# Patient Record
Sex: Female | Born: 1975 | Race: White | Hispanic: No | Marital: Married | Smoking: Former smoker
Health system: Southern US, Community
[De-identification: ages and names within clinical notes are randomized; demographics above are authoritative.]

## PROBLEM LIST (undated history)

## (undated) DIAGNOSIS — G8929 Other chronic pain: Secondary | ICD-10-CM

## (undated) DIAGNOSIS — M810 Age-related osteoporosis without current pathological fracture: Secondary | ICD-10-CM

## (undated) DIAGNOSIS — Z720 Tobacco use: Secondary | ICD-10-CM

## (undated) DIAGNOSIS — D649 Anemia, unspecified: Secondary | ICD-10-CM

## (undated) DIAGNOSIS — K219 Gastro-esophageal reflux disease without esophagitis: Secondary | ICD-10-CM

## (undated) DIAGNOSIS — N809 Endometriosis, unspecified: Secondary | ICD-10-CM

## (undated) DIAGNOSIS — G4730 Sleep apnea, unspecified: Secondary | ICD-10-CM

## (undated) DIAGNOSIS — O24419 Gestational diabetes mellitus in pregnancy, unspecified control: Secondary | ICD-10-CM

## (undated) DIAGNOSIS — M47816 Spondylosis without myelopathy or radiculopathy, lumbar region: Secondary | ICD-10-CM

## (undated) DIAGNOSIS — R609 Edema, unspecified: Secondary | ICD-10-CM

## (undated) DIAGNOSIS — E785 Hyperlipidemia, unspecified: Secondary | ICD-10-CM

## (undated) DIAGNOSIS — I10 Essential (primary) hypertension: Secondary | ICD-10-CM

## (undated) DIAGNOSIS — Z8742 Personal history of other diseases of the female genital tract: Secondary | ICD-10-CM

## (undated) DIAGNOSIS — R232 Flushing: Secondary | ICD-10-CM

## (undated) DIAGNOSIS — R251 Tremor, unspecified: Secondary | ICD-10-CM

## (undated) DIAGNOSIS — F329 Major depressive disorder, single episode, unspecified: Secondary | ICD-10-CM

## (undated) DIAGNOSIS — F319 Bipolar disorder, unspecified: Secondary | ICD-10-CM

## (undated) DIAGNOSIS — J45909 Unspecified asthma, uncomplicated: Secondary | ICD-10-CM

## (undated) DIAGNOSIS — F410 Panic disorder [episodic paroxysmal anxiety] without agoraphobia: Secondary | ICD-10-CM

## (undated) DIAGNOSIS — F32A Depression, unspecified: Secondary | ICD-10-CM

## (undated) DIAGNOSIS — M1990 Unspecified osteoarthritis, unspecified site: Secondary | ICD-10-CM

## (undated) DIAGNOSIS — N83209 Unspecified ovarian cyst, unspecified side: Secondary | ICD-10-CM

## (undated) DIAGNOSIS — R011 Cardiac murmur, unspecified: Secondary | ICD-10-CM

## (undated) HISTORY — DX: Unspecified asthma, uncomplicated: J45.909

## (undated) HISTORY — DX: Other chronic pain: G89.29

## (undated) HISTORY — DX: Major depressive disorder, single episode, unspecified: F32.9

## (undated) HISTORY — DX: Bipolar disorder, unspecified: F31.9

## (undated) HISTORY — DX: Gestational diabetes mellitus in pregnancy, unspecified control: O24.419

## (undated) HISTORY — DX: Panic disorder (episodic paroxysmal anxiety): F41.0

## (undated) HISTORY — DX: Cardiac murmur, unspecified: R01.1

## (undated) HISTORY — PX: GASTRIC BYPASS: SHX52

## (undated) HISTORY — PX: CHOLECYSTECTOMY: SHX55

## (undated) HISTORY — DX: Endometriosis, unspecified: N80.9

## (undated) HISTORY — DX: Depression, unspecified: F32.A

## (undated) HISTORY — DX: Tremor, unspecified: R25.1

## (undated) HISTORY — PX: ABDOMINAL HYSTERECTOMY: SHX81

## (undated) HISTORY — DX: Edema, unspecified: R60.9

## (undated) HISTORY — DX: Flushing: R23.2

---

## 1999-02-24 ENCOUNTER — Other Ambulatory Visit: Admission: RE | Admit: 1999-02-24 | Discharge: 1999-02-24 | Payer: Self-pay | Admitting: Obstetrics and Gynecology

## 1999-12-21 ENCOUNTER — Encounter: Admission: RE | Admit: 1999-12-21 | Discharge: 1999-12-21 | Payer: Self-pay | Admitting: Obstetrics and Gynecology

## 1999-12-21 ENCOUNTER — Encounter: Payer: Self-pay | Admitting: Obstetrics and Gynecology

## 2000-05-15 ENCOUNTER — Inpatient Hospital Stay (HOSPITAL_COMMUNITY): Admission: AD | Admit: 2000-05-15 | Discharge: 2000-05-19 | Payer: Self-pay | Admitting: Obstetrics and Gynecology

## 2000-05-15 ENCOUNTER — Encounter (INDEPENDENT_AMBULATORY_CARE_PROVIDER_SITE_OTHER): Payer: Self-pay

## 2001-02-04 ENCOUNTER — Encounter: Admission: RE | Admit: 2001-02-04 | Discharge: 2001-02-04 | Payer: Self-pay | Admitting: Orthopedic Surgery

## 2001-02-04 ENCOUNTER — Encounter: Payer: Self-pay | Admitting: Orthopedic Surgery

## 2001-07-25 ENCOUNTER — Ambulatory Visit (HOSPITAL_BASED_OUTPATIENT_CLINIC_OR_DEPARTMENT_OTHER): Admission: RE | Admit: 2001-07-25 | Discharge: 2001-07-25 | Payer: Self-pay | Admitting: Emergency Medicine

## 2001-10-29 ENCOUNTER — Other Ambulatory Visit: Admission: RE | Admit: 2001-10-29 | Discharge: 2001-10-29 | Payer: Self-pay | Admitting: Obstetrics and Gynecology

## 2002-09-13 ENCOUNTER — Emergency Department (HOSPITAL_COMMUNITY): Admission: EM | Admit: 2002-09-13 | Discharge: 2002-09-13 | Payer: Self-pay | Admitting: *Deleted

## 2002-11-24 ENCOUNTER — Other Ambulatory Visit: Admission: RE | Admit: 2002-11-24 | Discharge: 2002-11-24 | Payer: Self-pay | Admitting: Obstetrics and Gynecology

## 2003-04-13 ENCOUNTER — Ambulatory Visit (HOSPITAL_COMMUNITY): Admission: RE | Admit: 2003-04-13 | Discharge: 2003-04-13 | Payer: Self-pay | Admitting: Gastroenterology

## 2003-04-13 ENCOUNTER — Encounter: Payer: Self-pay | Admitting: Gastroenterology

## 2003-04-20 ENCOUNTER — Ambulatory Visit (HOSPITAL_COMMUNITY): Admission: RE | Admit: 2003-04-20 | Discharge: 2003-04-21 | Payer: Self-pay | Admitting: General Surgery

## 2003-04-20 ENCOUNTER — Encounter (INDEPENDENT_AMBULATORY_CARE_PROVIDER_SITE_OTHER): Payer: Self-pay | Admitting: *Deleted

## 2003-04-20 ENCOUNTER — Encounter: Payer: Self-pay | Admitting: General Surgery

## 2003-07-09 ENCOUNTER — Ambulatory Visit (HOSPITAL_COMMUNITY): Admission: RE | Admit: 2003-07-09 | Discharge: 2003-07-09 | Payer: Self-pay | Admitting: Gastroenterology

## 2003-07-23 ENCOUNTER — Ambulatory Visit (HOSPITAL_COMMUNITY): Admission: RE | Admit: 2003-07-23 | Discharge: 2003-07-23 | Payer: Self-pay | Admitting: Gastroenterology

## 2003-07-23 ENCOUNTER — Encounter: Payer: Self-pay | Admitting: Gastroenterology

## 2004-01-17 ENCOUNTER — Other Ambulatory Visit: Admission: RE | Admit: 2004-01-17 | Discharge: 2004-01-17 | Payer: Self-pay | Admitting: Obstetrics and Gynecology

## 2004-03-23 ENCOUNTER — Ambulatory Visit (HOSPITAL_COMMUNITY): Admission: RE | Admit: 2004-03-23 | Discharge: 2004-03-23 | Payer: Self-pay | Admitting: Obstetrics and Gynecology

## 2004-03-23 ENCOUNTER — Encounter (INDEPENDENT_AMBULATORY_CARE_PROVIDER_SITE_OTHER): Payer: Self-pay | Admitting: Specialist

## 2005-03-13 ENCOUNTER — Other Ambulatory Visit: Admission: RE | Admit: 2005-03-13 | Discharge: 2005-03-13 | Payer: Self-pay | Admitting: Obstetrics and Gynecology

## 2005-05-08 ENCOUNTER — Encounter: Admission: RE | Admit: 2005-05-08 | Discharge: 2005-08-06 | Payer: Self-pay | Admitting: Obstetrics and Gynecology

## 2005-05-18 ENCOUNTER — Inpatient Hospital Stay (HOSPITAL_COMMUNITY): Admission: AD | Admit: 2005-05-18 | Discharge: 2005-05-18 | Payer: Self-pay | Admitting: Obstetrics & Gynecology

## 2005-06-24 ENCOUNTER — Inpatient Hospital Stay (HOSPITAL_COMMUNITY): Admission: AD | Admit: 2005-06-24 | Discharge: 2005-06-24 | Payer: Self-pay | Admitting: Obstetrics and Gynecology

## 2005-08-08 ENCOUNTER — Inpatient Hospital Stay (HOSPITAL_COMMUNITY): Admission: AD | Admit: 2005-08-08 | Discharge: 2005-08-08 | Payer: Self-pay

## 2005-08-17 ENCOUNTER — Ambulatory Visit (HOSPITAL_BASED_OUTPATIENT_CLINIC_OR_DEPARTMENT_OTHER): Admission: RE | Admit: 2005-08-17 | Discharge: 2005-08-17 | Payer: Self-pay | Admitting: Pulmonary Disease

## 2005-08-18 ENCOUNTER — Ambulatory Visit: Payer: Self-pay | Admitting: Pulmonary Disease

## 2005-08-22 ENCOUNTER — Ambulatory Visit: Payer: Self-pay | Admitting: Pulmonary Disease

## 2005-08-31 ENCOUNTER — Inpatient Hospital Stay (HOSPITAL_COMMUNITY): Admission: AD | Admit: 2005-08-31 | Discharge: 2005-08-31 | Payer: Self-pay | Admitting: Obstetrics and Gynecology

## 2005-09-05 ENCOUNTER — Inpatient Hospital Stay (HOSPITAL_COMMUNITY): Admission: AD | Admit: 2005-09-05 | Discharge: 2005-09-05 | Payer: Self-pay | Admitting: Obstetrics and Gynecology

## 2005-09-08 ENCOUNTER — Inpatient Hospital Stay (HOSPITAL_COMMUNITY): Admission: AD | Admit: 2005-09-08 | Discharge: 2005-09-08 | Payer: Self-pay | Admitting: Obstetrics and Gynecology

## 2005-09-12 ENCOUNTER — Encounter (INDEPENDENT_AMBULATORY_CARE_PROVIDER_SITE_OTHER): Payer: Self-pay | Admitting: Specialist

## 2005-09-12 ENCOUNTER — Inpatient Hospital Stay (HOSPITAL_COMMUNITY): Admission: AD | Admit: 2005-09-12 | Discharge: 2005-09-16 | Payer: Self-pay | Admitting: Obstetrics and Gynecology

## 2005-09-26 ENCOUNTER — Inpatient Hospital Stay (HOSPITAL_COMMUNITY): Admission: AD | Admit: 2005-09-26 | Discharge: 2005-09-26 | Payer: Self-pay | Admitting: Obstetrics and Gynecology

## 2005-11-01 ENCOUNTER — Other Ambulatory Visit: Admission: RE | Admit: 2005-11-01 | Discharge: 2005-11-01 | Payer: Self-pay | Admitting: Obstetrics and Gynecology

## 2008-08-13 ENCOUNTER — Emergency Department (HOSPITAL_COMMUNITY): Admission: EM | Admit: 2008-08-13 | Discharge: 2008-08-13 | Payer: Self-pay | Admitting: Emergency Medicine

## 2008-08-24 ENCOUNTER — Encounter: Payer: Self-pay | Admitting: Gastroenterology

## 2008-09-02 ENCOUNTER — Ambulatory Visit: Payer: Self-pay | Admitting: Gastroenterology

## 2008-09-02 DIAGNOSIS — R1013 Epigastric pain: Secondary | ICD-10-CM

## 2008-09-02 DIAGNOSIS — K219 Gastro-esophageal reflux disease without esophagitis: Secondary | ICD-10-CM

## 2008-09-22 ENCOUNTER — Telehealth: Payer: Self-pay | Admitting: Gastroenterology

## 2008-12-01 ENCOUNTER — Encounter (INDEPENDENT_AMBULATORY_CARE_PROVIDER_SITE_OTHER): Payer: Self-pay | Admitting: Obstetrics and Gynecology

## 2008-12-01 ENCOUNTER — Ambulatory Visit (HOSPITAL_COMMUNITY): Admission: RE | Admit: 2008-12-01 | Discharge: 2008-12-02 | Payer: Self-pay | Admitting: Obstetrics and Gynecology

## 2008-12-05 ENCOUNTER — Inpatient Hospital Stay (HOSPITAL_COMMUNITY): Admission: AD | Admit: 2008-12-05 | Discharge: 2008-12-07 | Payer: Self-pay | Admitting: Obstetrics and Gynecology

## 2009-11-18 ENCOUNTER — Inpatient Hospital Stay: Payer: Self-pay | Admitting: Internal Medicine

## 2009-12-21 IMAGING — CR DG ABDOMEN ACUTE W/ 1V CHEST
4 series · 4 of 4 positions shown · non-contrast
Comparison: None.

CLINICAL DATA: 32-year-old female shortness of breath, mid
abdominal pain, nausea

ACUTE ABDOMEN SERIES (ABDOMEN 2 VIEW & CHEST 1 VIEW)

[w chest pa]
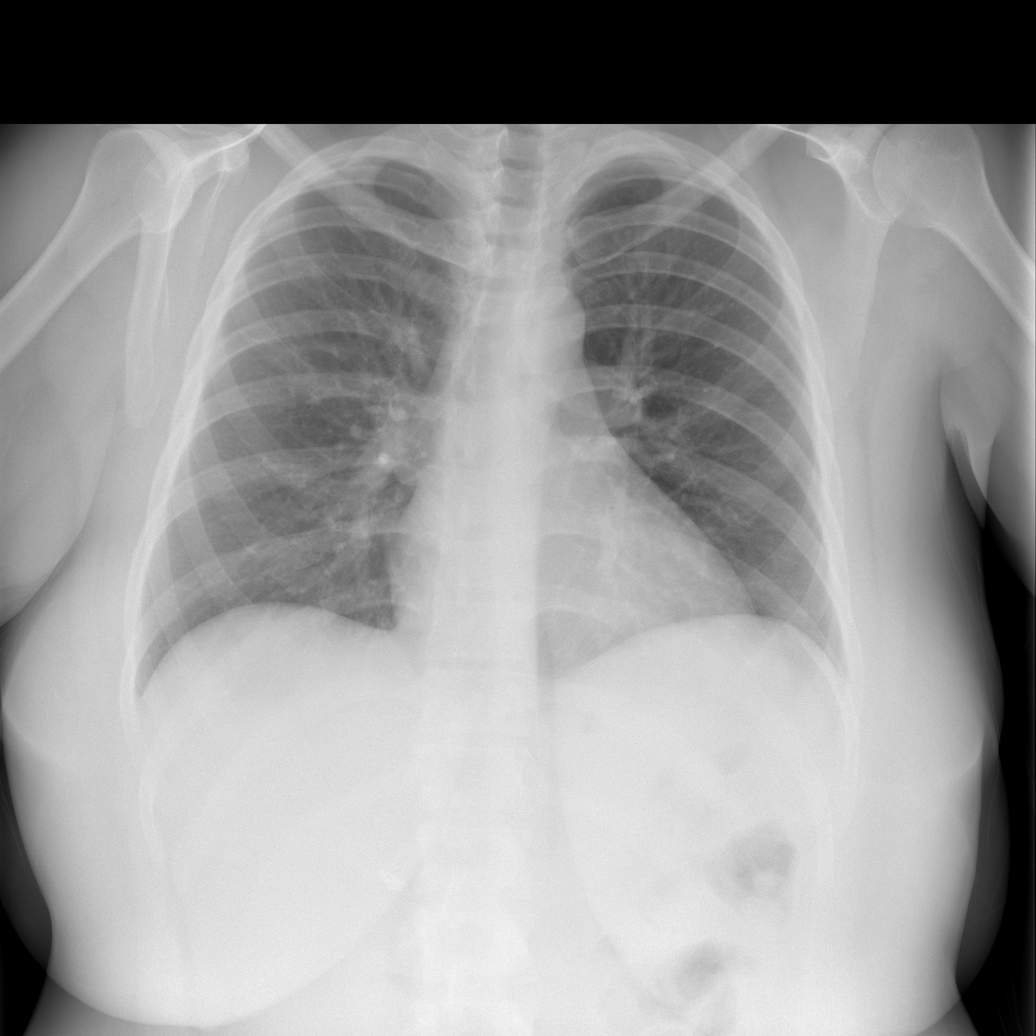

[w abdomen upright *]
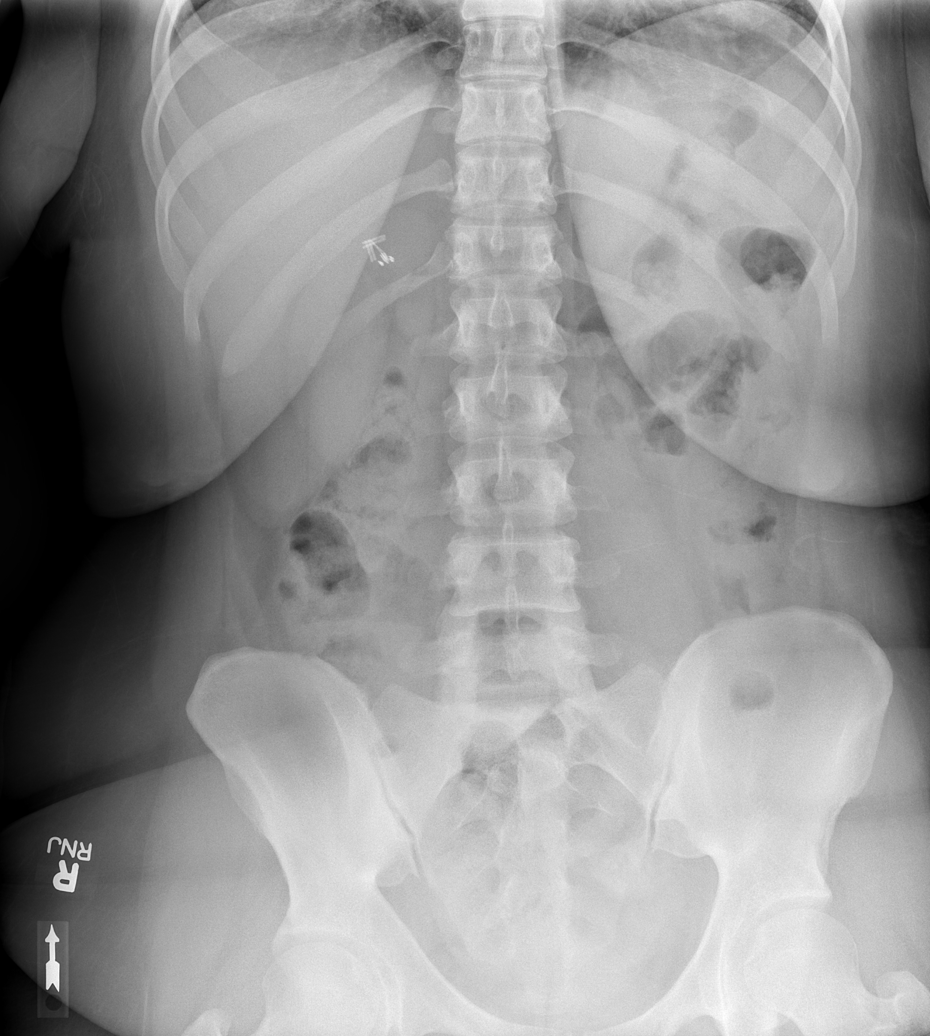

[t abdomen supine (1 of 2)]
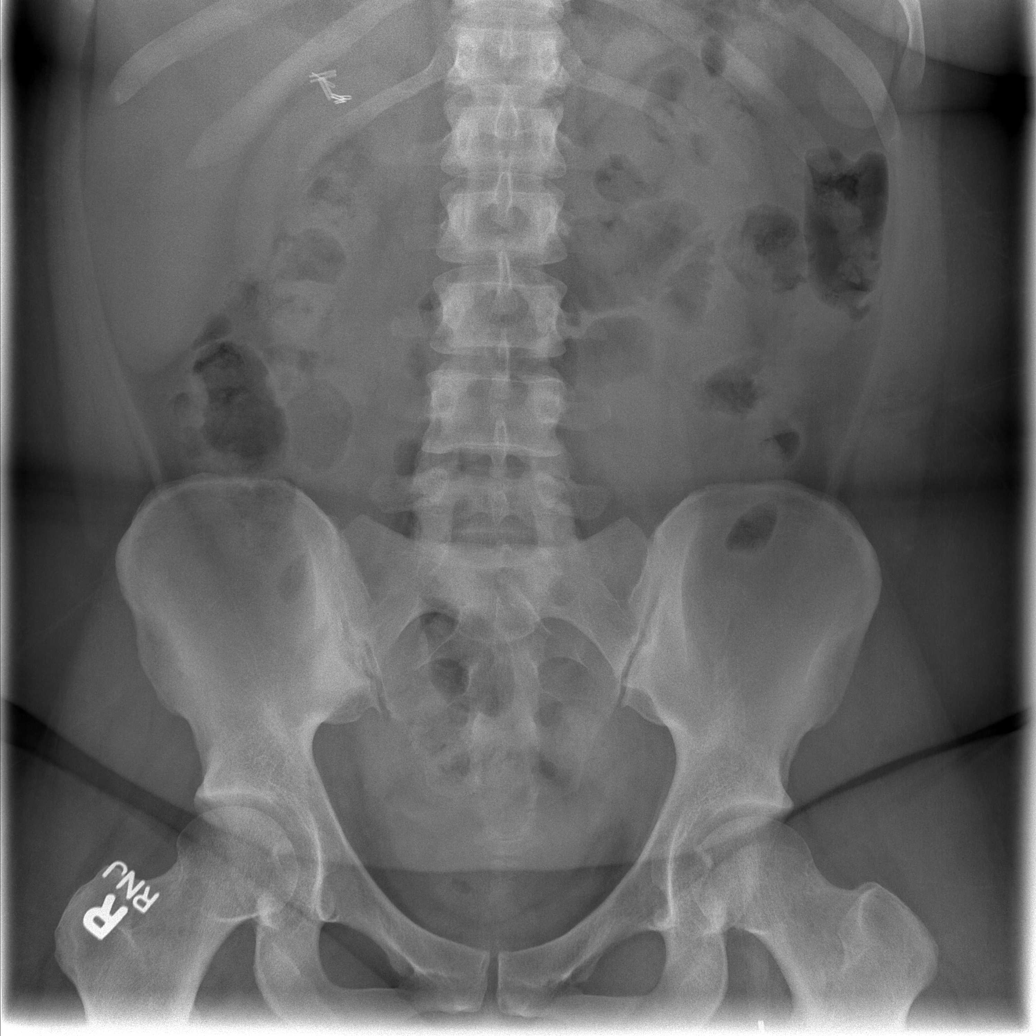

[t abdomen supine (2 of 2)]
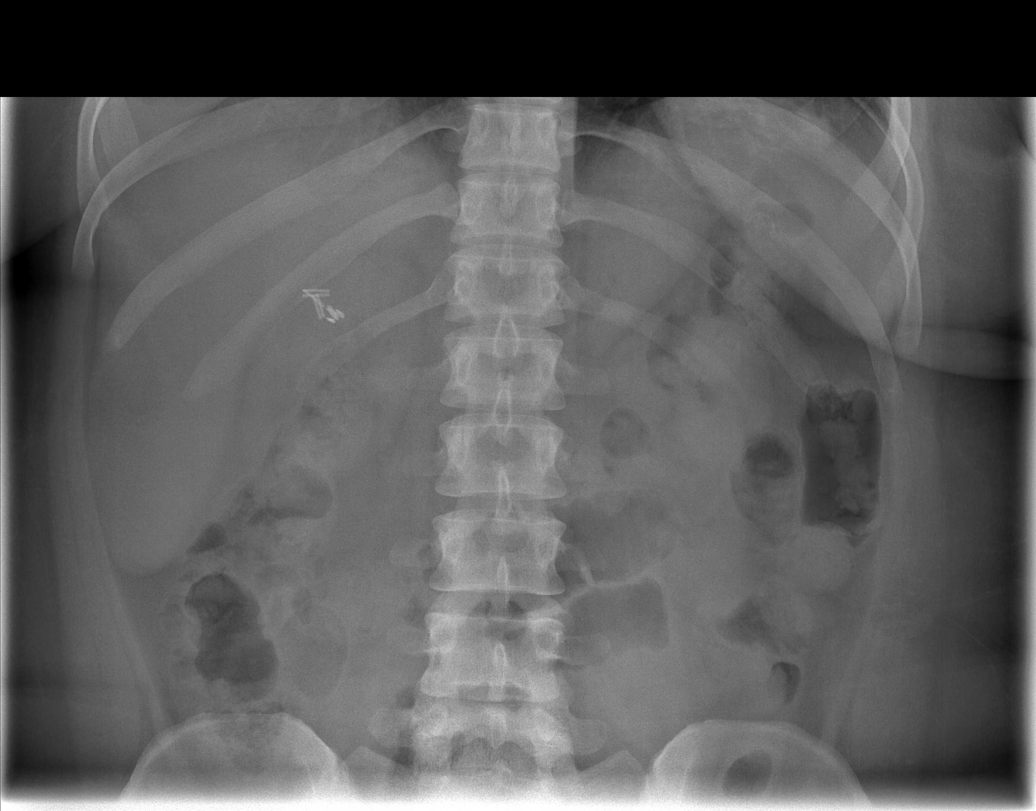

[4 of 4 positions shown; findings below may reference images not displayed]

FINDINGS: Chest:  Normal heart size and vascularity.  No focal pneumonia,
edema, effusion or pneumothorax.  Trachea is midline.

Abdomen:  The patient is status post cholecystectomy.  Nonspecific
bowel gas pattern without obstruction, ileus or significant
dilatation.  No free air.  No abnormal calcifications appreciated.
IMPRESSION: No acute findings in the chest or abdomen by plain radiography.

## 2010-09-07 ENCOUNTER — Emergency Department: Payer: Self-pay | Admitting: Emergency Medicine

## 2010-12-17 HISTORY — PX: BARIATRIC SURGERY: SHX1103

## 2011-05-01 NOTE — H&P (Signed)
NAMETISH, BEGIN              ACCOUNT NO.:  [id number]   MEDICAL RECORD NO.:  [id number]         PATIENT TYPE:  WOIB   LOCATION:  374                           FACILITY:  [facility]   PHYSICIAN:  Guy Sandifer. Henderson Cloud, M.D. DATE OF BIRTH:  Oct 19, 1976   DATE OF ADMISSION:  12/01/2008  DATE OF DISCHARGE:                              HISTORY & PHYSICAL   CHIEF COMPLAINT:  Heavy irregular menses.   HISTORY OF PRESENT ILLNESS:  This patient is a 35 year old married white  female G4, P2 status post tubal ligation whose menses are occurring  every 2-3 weeks.  They are heavy, passing clots.  She also has pain  getting steadily worse with each menses.  She is having pain with  intercourse.  Ultrasound in my office on October 01, 2008, revealed  uterus measuring 8.9 x 5.0 x 6.5 cm.  There is a 3.4-cm simple cyst in  the left ovary and no intracavitary masses.  After discussion of  options, she was being admitted for laparoscopically-assisted vaginal  hysterectomy.  Ovaries will be left in place unless they are distinctly  abnormal.  Potential risks and complications have been discussed with  the patient preoperatively.   PAST MEDICAL HISTORY:  1. Migraine headaches.  2. Endometriosis.   PAST SURGICAL HISTORY:  1. Cholecystectomy, 2004  2. Wisdom tooth extraction, 1996.  3. Laparoscopy, hysteroscopy, D&C 2005.   OBSTETRIC HISTORY:  Cesarean section x2.   FAMILY HISTORY:  Positive for asthma, irritable bowel syndrome, UTIs,  osteoporosis, diverticulosis, arthritis, diabetes, hypertension, colon  and breast cancer.   MEDICATIONS:  Diazepam, Vicodin, Zoloft.   ALLERGIES:  WELLBUTRIN and LATEX.   SOCIAL HISTORY:  Smokes 1 pack of cigarettes a day.  Denies drug or  alcohol abuse.   REVIEW OF SYSTEMS:  NEURO:  Headache as above.  CARDIO:  Denies chest  pain.  PULMONARY:  Denies shortness of breath.  GI:  Denies recent  changes in bowel habits.   PHYSICAL EXAMINATION:  VITAL SIGNS:  Height 5  feet 5 inches, weight 232  pounds, blood pressure 100/62.  HEENT:  Without thyromegaly.  LUNGS:  Clear to auscultation.  HEART:  Regular rate and rhythm.  ABDOMEN:  Soft, nontender without masses.  PELVIC:  Vulva, vagina, and cervix without lesion.  First-degree  cystocele.  Uterus upper normal size, mobile, mildly tender.  Adnexa  nontender without palpable masses.  EXTREMITIES:  Grossly within normal  limits.  NEUROLOGIC:  Grossly within normal limits.   ASSESSMENT:  Menometrorrhagia and dysmenorrhea.   PLAN:  Laparoscopically-assisted vaginal hysterectomy.      Guy Sandifer Henderson Cloud, M.D.  Electronically Signed     JET/MEDQ  D:  11/22/2008  T:  11/23/2008  Job:  [id number]

## 2011-05-01 NOTE — Discharge Summary (Signed)
NAMELAURISA, SAHAKIAN              ACCOUNT NO.:  [id number]   MEDICAL RECORD NO.:  [id number]          PATIENT TYPE:  INP   LOCATION:  9305                          [facility]ACILITY:  [facility]   PHYSICIAN:  Guy Sandifer. Henderson Cloud, M.D. DATE OF BIRTH:  09-13-1976   DATE OF ADMISSION:  12/05/2008  DATE OF DISCHARGE:  12/07/2008                               DISCHARGE SUMMARY   ADMITTING DIAGNOSES:  1. Status post laparoscopically-assisted vaginal hysterectomy on      December 01, 2008.  2. Postop fever possible vaginal cuff cellulitis.   DISCHARGE DIAGNOSES:  1. Status post laparoscopically-assisted vaginal hysterectomy,      December 01, 2008.  2. Pneumonia.   REASON FOR ADMISSION:  This patient is a 35 year old white female status  post laparoscopically-assisted vaginal hysterectomy on December 01, 2008.  The patient has sleep apnea.  She was monitored in the ICU and  her CPAP was used at that time.  She was discharged home the following  day doing well.  She was given instructions to use her CPAP as  prescribed.  She returned on the evening of December 05, 2008,  complaining of feeling bad, fever, and a productive cough.   HOSPITAL COURSE:  In maternity admission, this patient was noted to have  a temperature of 103 degrees.  Hemoglobin is 11.7, white count is 11.2.  Pelvic ultrasound was negative for pelvic abscess.  Probable cuff  cellulitis was noted.  Blood cultures were drawn.  The patient was  admitted in the hospital given IV fluids and IV Unasyn.  On December 06, 2008, she was feeling somewhat better.  Her maximum temperature on the  floor was 100.3.  She had an expiratory wheeze on the left and bibasilar  crackles.  Antibiotics and IV fluids were continued.  Chest x-ray was  obtained and was consistent with bilateral lower lobe subsegmental  atelectasis versus pneumonia.  CPAP was continued, as well as pulse  oximetry when the patient was asleep.  Incentive spirometry was started  as  well as albuterol nebulizers around the clock.  On the day of  discharge, she was feeling much better.  Cough was resolved.  Her lungs  were clear anteriorly and posteriorly.  She is afebrile.  Blood cultures  are negative.   CONDITION ON DISCHARGE:  Good.   DIET:  Regular, as tolerated.   ACTIVITY:  Incentive spirometry q.1 h. while awake, no lifting, no  operation of automobiles, no vaginal entry.  She is to call the office  for problems including not limited to recurrent cough, temperature above  100 degrees, persistent nausea, vomiting, heavy bleeding, or increasing  pain.   MEDICATIONS:  Augmentin 875 mg #12 one p.o. b.i.d. (the patient noted  allergy to PENICILLIN; however, this was simply a yeast infection and  she has tolerated Unasyn well while on the hospital).  She will resume  her other medications that she was taking prior to admission.  Followup  is in the office in 1 week.      Guy Sandifer Henderson Cloud, M.D.  Electronically Signed     JET/MEDQ  D:  12/07/2008  T:  12/07/2008  Job:  [id number]

## 2011-05-01 NOTE — Op Note (Signed)
[name], [name]              ACCOUNT NO.:  [id number]   MEDICAL RECORD NO.:  [id number]          PATIENT TYPE:  OIB   LOCATION:  9374                          [facility]ILITY[facility]   PHYSICIAN:  Guy Sandifer. Henderson Cloud, M.D. DATE OF BIRTH:  [birth year]   DATE OF PROCEDURE:  12/01/2008  DATE OF DISCHARGE:                               OPERATIVE REPORT   PREOPERATIVE DIAGNOSIS:  Menometrorrhagia and dysmenorrhea.   POSTOPERATIVE DIAGNOSIS:  Menometrorrhagia and dysmenorrhea.   PROCEDURE:  Laparoscopically-assisted vaginal hysterectomy and lysis of  adhesions.   SURGEON:  Roselle Locus, MD.   ASSISTANT:  Duke Salvia. Marcelle Overlie, MD.   ANESTHESIA:  General endotracheal intubation.   SPECIMENS:  Uterus to pathology.   ESTIMATED BLOOD LOSS:  350 mL.   INDICATIONS AND CONSENT:  The patient is a 35 year old married white  female, G4, P2, status post tubal ligation with increasingly heavy  painful menses.  Details are dictated in the history and physical.  After discussion of options, she is being admitted for laparoscopically  assisted vaginal hysterectomy and removal of the tube and ovary, and  __________ distinctly abnormal.  Potential risks and complications have  been discussed preoperatively including, but not limited to infection,  organ damage, bleeding requiring transfusion of blood products with HIV  and hepatitis acquisition, DVT, PE, pneumonia, laparotomy, fistula  formation, postoperative pain and dyspareunia.  All questions have been  answered and consent signed on the chart.   FINDINGS:  Upper abdomen was grossly normal.  There are omental  adhesions below the umbilicus in the midline.  In the pelvis, the  anterior and posterior cul-de-sacs were normal.  The uterus was 6 weeks  in size.  Tubes and ovaries were normal bilaterally.   DESCRIPTION OF PROCEDURE:  The patient was taken to the operating room  where she was identified, placed in dorsal supine position, and general  anesthesia was induced via endotracheal intubation.  She was then placed  in the dorsal lithotomy position where she was prepped abdominally and  vaginally.  Bladder straight catheterized.  Hulka tenaculum was placed,  the uterus was manipulated, and she was draped in sterile fashion.  The  infraumbilical and suprapubic areas were injected in the midline with  0.5% plain Marcaine.   A small infraumbilical incision was made, and a disposable Veress needle  was placed on the first attempt without difficulty.  A normal syringe  and drop test were noted.  Two liters of gas was then insufflated under  low pressure with good tympany in the right upper quadrant.  Veress  needle was removed.  A 10/11 Xcel bladeless disposable trocar sleeve was  then placed under direct visualization.  After placement, there was  noted to be an irregularity in the contour of the lip of the trocar  sleeve.  This was then removed and replaced with another Xcel bladeless  disposable trocar sleeve that is again placed under direct visualization  with the diagnostic laparoscope.   Inspection of the first sleeve revealed there is no evidence that it is  broken or that there is a fragment missing.  It is simply an  irregularity in the contour of the lip of the trocar sleeve.  The above-  mentioned omental adhesions were noted.  Careful inspection on both  sides reveals no bowel was involved.  Then, using the EndoSeal cautery  cutting product, these adhesions were taken down on the right half of  the adhesions at their point of insertion at the anterior abdominal  wall.  Good hemostasis was noted.  There were no closed loops of the  remaining adhesions.  This allows adequate visualization of the pelvis.  A small suprapubic incision was made, and a 5-mm Xcel bladeless  disposable trocar sleeve is placed under direct visualization without  difficulty.  The above findings were noted.  Then, using the EndoSeal  device,  proximal ligaments were taken down at the level of the  vesicouterine peritoneum bilaterally.  There were adhesions of the  bladder to the lower uterine segment, which were taken down with  scissors.  Suprapubic trocar sleeve was removed.  Instruments are  removed, and then attention was turned to the vagina.  Posterior cul-de-  sac entered sharply, and the cervix was circumscribed with unipolar  cautery.  Mucosa advanced sharply and bluntly.   Then using the LigaSure bipolar cautery cutting device, the uterosacral  ligaments followed by the bladder pillars, cardinal ligaments, and  uterine vessels were taken down bilaterally.  Fundus was delivered  posteriorly.  The remaining pedicles were taken down, and the specimen  was delivered.  Uterosacral ligaments were then plicated in the vagina  bilaterally with separate sutures of 0 Monocryl.  All sutures will be 0  Monocryl unless otherwise os designated.  Uterosacral ligaments were  plicated in the midline with a third suture.  Cuff was closed with  figure-of-eights.  Foley catheter was placed in the bladder and clear  urine was noted.  Attention was returned to the abdomen.  Pneumoperitoneum was recreated and the suprapubic trocar sleeve was  reintroduced under direct visualization.  Moderate oozing of peritoneal  edges was noted and controlled with bipolar cautery.  Inspection of  reduced pneumoperitoneum reveals good hemostasis.  Excess fluid was  removed.  All instruments are removed.  The umbilical incision was  closed with subcuticular 3-0 Vicryl suture.  Dermabond was placed on  both incisions.  All counts were correct.  The patient was awakened and  taken to recovery room in stable condition.      Guy Sandifer Henderson Cloud, M.D.  Electronically Signed     JET/MEDQ  D:  12/01/2008  T:  12/02/2008  Job:  [id number]

## 2011-05-04 NOTE — Op Note (Signed)
   [name], [name]                        ACCOUNT NO.:  [id number]   MEDICAL RECORD NO.:  [id number]                   PATIENT TYPE:  AMB   LOCATION:  ENDO                                 FACILITY:  [facility]   PHYSICIAN:  Anselmo Rod, M.D.               DATE OF BIRTH:  1976/11/18   DATE OF PROCEDURE:  07/09/2003  DATE OF DISCHARGE:                                 OPERATIVE REPORT   PROCEDURE PERFORMED:  Esophagogastroduodenoscopy.   ENDOSCOPIST:  Charna Elizabeth, M.D.   INSTRUMENT USED:  Olympus video panendoscope.   INDICATIONS FOR PROCEDURE:  Epigastric pain with nausea in a 35 year old  white female who has had a laparoscopic cholecystectomy in May of 2004.  Rule out peptic ulcer disease, esophagitis, gastritis, etc.  The patient is  also under considerable stress because of recent separation from her husband  and problems associated with the separation.   PREPROCEDURE PHYSICAL:  The patient had stable vital signs.  Neck supple,  chest clear to auscultation.  Abdomen soft with normal bowel sounds.  Epigastric tenderness on palpation with guarding, no rebound, no rigidity,  no hepatosplenomegaly.   DESCRIPTION OF PROCEDURE:  The patient was placed in the left lateral  decubitus position and sedated with 70 mg of Demerol and 7 mg of Versed  intravenously.  Once the patient was adequately sedated and maintained on  low-flow oxygen and continuous cardiac monitoring, the Olympus video  panendoscope was advanced through the mouth piece over the tongue into the  esophagus under direct vision.  The entire esophagus appeared normal with no  evidence of ring, stricture, masses, esophagitis or Barrett's mucosa.  The  scope was then advanced to the stomach.  The entire gastric mucosa appeared  healthy into the proximal small bowel.  Retroflexion in the high cardia  revealed no abnormalities.   IMPRESSION:  Normal esophagogastroduodenoscopy.    RECOMMENDATIONS:  1. Trial of  anxiolytics for now.  2. Outpatient follow-up in the next two weeks for further recommendations.                                                Anselmo Rod, M.D.    JNM/MEDQ  D:  07/09/2003  T:  07/09/2003  Job:  [id number]   cc:   Talmadge Coventry, M.D.  [address], [address]  [place]  [place] [address]  Fax: [phone]   Adolph Pollack, M.D.  [address], [address]  [place]  [place] [address]  Fax: [phone]

## 2011-05-04 NOTE — H&P (Signed)
[name], WOODRING              ACCOUNT NO.:  [id number]   MEDICAL RECORD NO.:  [id number]          PATIENT TYPE:  INP   LOCATION:  NA                            [facility]   PHYSICIAN:  Guy Sandifer. Henderson Cloud, M.D. DATE OF BIRTH:  11-26-1976   DATE OF ADMISSION:  09/12/2005  DATE OF DISCHARGE:                                HISTORY & PHYSICAL   CHIEF COMPLAINT:  Desires repeat cesarean section.   HISTORY OF PRESENT ILLNESS:  This patient is a 35 year old white female, G3  P1, status post cesarean section in 2001, who has had a pregnancy  complicated by gestational diabetes controlled with glyburide.  Amniocentesis, on September 05, 2005, revealed an L/S ratio of 1.6:1 and no  PG.  After discussion of the options, the patient has been scheduled for a  cesarean section today.  Potential risks and complications have been  reviewed preoperatively.  This pregnancy has also been complicated by  smoking, migraine headaches, depression.  Finally, the patient has been  diagnosed with sleep apnea and has a CPAP machine per Dr. Shelle Iron.   Past medical history, past surgical history, family history, obstetric  history:  See prenatal history and physical.   MEDICATIONS:  1.  Zoloft 150 mg daily, recently reduced to 100 mg daily.  2.  Ambien 10 mg q.h.s. p.r.n.  3.  Glyburide 2.5 mg daily.  4.  Prenatal vitamin daily.   ALLERGIES:  1.  WELLBUTRIN.  2.  AMOXICILLIN leading to yeast infection.  3.  LATEX allergy.   REVIEW OF SYSTEMS:  NEURO:  Migraine headaches as above.  CARDIAC: Denies  chest pain.  PULMONARY:  Denies shortness of breath.   PHYSICAL EXAMINATION:  VITAL SIGNS:  Height 5 feet 4 inches, weight 286  pounds, blood pressure 120/78.  LUNGS:  Clear to auscultation.  HEART:  Regular rate and rhythm.  BREASTS:  Not examined.  ABDOMEN:  Gravid.  No epigastric tenderness.  PELVIC:  Cervix closed, thick, and high.  EXTREMITIES:  Grossly within normal limits.  NEUROLOGIC:   Grossly within normal limits.   ASSESSMENT:  Previous cesarean section.   PLAN:  Repeat cesarean section and tubal ligation.  Permanence failure rate  and increased ectopic risk of tubal ligation have also been discussed.      Guy Sandifer Henderson Cloud, M.D.  Electronically Signed     JET/MEDQ  D:  09/11/2005  T:  09/11/2005  Job:  [id number]

## 2011-05-04 NOTE — Op Note (Signed)
[name], [name]              ACCOUNT NO.:  [id number]   MEDICAL RECORD NO.:  [id number]          PATIENT TYPE:  INP   LOCATION:  9118                          [facility]   PHYSICIAN:  Guy Sandifer. Henderson Cloud, M.D. DATE OF BIRTH:  03/08/1976   DATE OF PROCEDURE:  09/12/2005  DATE OF DISCHARGE:                                 OPERATIVE REPORT   PREOPERATIVE DIAGNOSES:  1.  Intrauterine pregnancy at 37-1/2 weeks' estimated gestational age.  2.  Gestational diabetes.  3.  Previous cesarean section, desires repeat.  4.  Desires permanent sterilization.   POSTOPERATIVE DIAGNOSES:  1.  Intrauterine pregnancy at 37-1/2 weeks' estimated gestational age.  2.  Gestational diabetes.  3.  Previous cesarean section, desires repeat.  4.  Desires permanent sterilization.   PROCEDURES:  1.  Low transverse cesarean section.  2.  Bilateral tubal ligation.   SURGEON:  Guy Sandifer. Henderson Cloud, M.D.   ANESTHESIA:  Spinal.   SPECIMENS:  Placenta to pathology.   ESTIMATED BLOOD LOSS:  800 mL.   FINDINGS:  A viable female infant, Apgars of 4, 8 and 9 at one, five and 10  minutes respectively.  Birth weight 8 pounds 3 ounces.  Arterial cord pH  7.00.   INDICATIONS AND CONSENT:  This patient is a 35 year-old white female G3, P1,  with an EDC of October 03, 2005.  Pregnancy has been complicated by  gestational diabetes on glyburide, smoking, migraine headaches, depression,  chronic back pain and sleep apnea.  The patient underwent amniocentesis on  July 05, 2005, revealing LS ratio of 1.6 to 1 with no PG.  Serial nonstress  tests have been reactive.  Repeat cesarean section has been discussed with  the patient.  The potential risks and complications are reviewed  preoperatively including but limited to infection, bowel, bladder or  ureteral damage, bleeding requiring transfusion of blood products with  possible transfusion reaction, HIV and hepatitis acquisition, DVT, PE and  pneumonia.  The patient  desires permanent sterilization.  Permanence,  failure rate and increased ectopic risk of the procedure have been reviewed.  All questions were answered and consent is signed on the chart.   PROCEDURE:  The patient is taken to the operating room, where she is  identified, spinal anesthetic is placed and she is placed in the dorsal  supine position with 15-degree left lateral wedge.  Latex-free  instrumentation is used.  She is then prepped, the bladder is catheterized  with a latex-free Foley catheter, and she is draped in a sterile fashion.  After testing for adequate spinal anesthesia, skin is entered through the  previous Pfannenstiel scar and dissection is carried out in layers to the  peritoneum.  The peritoneum is incised and extended superiorly and  inferiorly.  Vesicouterine peritoneum is taken down cephalalolaterally,  bladder flap was developed, and the bladder blade is placed.  Uterus is  incised in a low transverse manner and the uterine cavity is entered bluntly  with a hemostat.  The uterine incision is extended cephalolaterally with the  fingers.  The lower edge of the placenta is  encountered but is easily swept  aside and the artificial rupture of membranes reveals clear fluid.  The  vertex is delivered, oropharynx and nasopharynx were suctioned, the  remainder of the baby is delivered.  The cord is clamped and cut and the  baby is handed to the waiting pediatrics team.  Placenta is manually  delivered and sent to pathology.  The uterus is clean.  Uterus is closed in  two running locking imbricating layers of 0 Monocryl suture, which achieves  good hemostasis.  Tubes and ovaries are normal bilaterally.  The right  fallopian tube is identified from cornu to fimbriae, grasped at its  midampullary portion and a knuckle of tube is then doubly ligated with two  free ties of 0 plain suture.  The intervening knuckle was then sharply  resected.  Cautery is used to obtain complete  hemostasis.  A similar  procedure is carried out on the opposite side.  Anterior peritoneum is  closed in a running fashion with 0 Monocryl suture, which is also used to  reapproximate the pyramidalis muscle in the midline.  Anterior rectus fascia  is closed in a running fashion with 0 PDS suture and the skin was closed  with clips.  All sponge, instrument and needle counts are correct and the  patient is transferred to the recovery room in stable condition.      Guy Sandifer Henderson Cloud, M.D.  Electronically Signed     JET/MEDQ  D:  09/12/2005  T:  09/12/2005  Job:  [id number]

## 2011-05-04 NOTE — Discharge Summary (Signed)
[name], [name]              ACCOUNT NO.:  [id number]   MEDICAL RECORD NO.:  [id number]          PATIENT TYPE:  OIB   LOCATION:  9499                          [facility]ILITY[facility]   PHYSICIAN:  Guy Sandifer. Henderson Cloud, M.D. DATE OF BIRTH:  Apr 19, 1976   DATE OF ADMISSION:  12/01/2008  DATE OF DISCHARGE:  12/02/2008                               DISCHARGE SUMMARY   ADMITTING DIAGNOSES:  Menometrorrhagia, dysmenorrhea.   DISCHARGE DIAGNOSES:  Menometrorrhagia, dysmenorrhea.   PROCEDURE:  On December 01, 2008 is laparoscopically-assisted vaginal  hysterectomy with lysis of adhesions.   REASON FOR ADMISSION:  This patient is a 35 year old married white  female G4, P2 status post tubal ligation with increasingly heavy and  frequent menses.  She is admitted for surgical management.   HOSPITAL COURSE:  The patient admitted to the hospital undergoes the  above procedure.  Estimated blood loss is 350 mL.  After recovery, she  is admitted to the Medical Intensive Care Unit because of her history of  sleep apnea.  She is monitored closely and given her CPAP for sleep.  She remained stable.  On December 02, 2008, she is ambulating, passing  flatus, and tolerating regular diet.  Vital signs are stable.  She  remains afebrile.  Hemoglobin is 10.9.  Pathology is pending.   CONDITION ON DISCHARGE:  Good.   DIET:  Regular as tolerated.   ACTIVITY:  No lifting, no operation of automobiles, and no vaginal  entry.  She is to call our office for problems including not limited to  temperature 101 degrees, persistent nausea, vomiting, increasing pain,  or heavy vaginal bleeding.   MEDICATIONS:  1. Percocet 5/325 mg #40 1-2 p.o. q.6 h p.r.n.  2. Ibuprofen 600 mg q.6 h p.r.n.  3. Multivitamin daily.   She will resume her CPAP as prescribed at home.  Followup is in the  office in 2 weeks.      Guy Sandifer Henderson Cloud, M.D.  Electronically Signed     JET/MEDQ  D:  01/20/2009  T:  01/20/2009  Job:   [id number]

## 2011-05-04 NOTE — Op Note (Signed)
[name], [name]                        ACCOUNT NO.:  [id number]   MEDICAL RECORD NO.:  [id number]                   PATIENT TYPE:  AMB   LOCATION:  SDC                                  FACILITY:  [facility]   PHYSICIAN:  Guy Sandifer. Arleta Creek, M.D.           DATE OF BIRTH:  03-02-1976   DATE OF PROCEDURE:  03/23/2004  DATE OF DISCHARGE:                                 OPERATIVE REPORT   PREOPERATIVE DIAGNOSES:  1. Menorrhagia.  2. Pelvic pain.   POSTOPERATIVE DIAGNOSES:  1. Menorrhagia.  2. Endometriosis.   PROCEDURES:  1. Open laparoscopy with biopsy of left pelvis and ablation of     endometriosis.  2. Hysteroscopy dilatation and curettage.   SURGEON:  Guy Sandifer. Henderson Cloud, M.D.   ANESTHESIA:  1. General with endotracheal intubation.  2. Xylocaine 1% paracervical block.   ESTIMATED BLOOD LOSS:  Less than 50 mL.   INTAKE AND OUTPUT OF SORBITOL DISTENDING MEDIUM:  20 mL deficit.   SPECIMENS:  1. Endometrial curettings.  2. Biopsy of left pelvis.   INDICATIONS AND CONSENT:  This patient is a 35 year old separated white  female, G1, P1, with increasing pelvic pain and heavy menstrual bleeding.  Details are dictated in the history and physical.  Laparoscopy,  hysteroscopy, D&C has been discussed with the patient.  The potential risks  and complications were discussed preoperatively, including but not limited  to infection, bowel, bladder, or ureteral damage, bleeding requiring  transfusion of blood products with possible transfusion reaction, HIV and  hepatitis acquisition, DVT, PE, pneumonia, laparotomy, recurrent pain and/or  menorrhagia.  All questions have been answered and consent is signed on the  chart.   FINDINGS:  Upper abdomen is grossly normal.  Uterus is upper normal size and  anteverted.  Anterior cul-de-sac contains a single dark brown powder-burn  lesion of endometriosis on the right vesicouterine peritoneum.  The left  pelvic sidewall contains two  dark-black implants of endometriosis just above  the course of the left ureter.  The right pelvic sidewall is clean.  Ovaries  and tubes are normal.  Posterior cul-de-sac contains three or four 4 mm  black to brown implants of endometriosis especially on the right side of the  posterior cul-de-sac.   PROCEDURE:  The patient is taken to the operating room, where she is  identified and placed in the dorsal supine position and general anesthesia  is induced via endotracheal intubation.  She is then placed in the dorsal  lithotomy position, where she is prepped abdominally and vaginally.  The  bladder is straight-catheterized, and she is draped in a sterile fashion.  Bivalve speculum is placed in the vagina.  The anterior cervical lip is  injected with 1% Xylocaine and grasped with a single-tooth tenaculum.  A  paracervical block is placed at the 2, 4, 5, 7, 8, and 10 o'clock positions  with approximately 20 mL total of 1% Xylocaine plain.  Cervix is then gently  progressively dilated with Shawnie Pons dilators to a 27 dilator.  The diagnostic  hysteroscope is then placed in the endocervical canal and advanced under  direct visualization using sorbitol distending media.  The endocervical  canal is without abnormal structure or vessel.  The hysteroscope is  withdrawn and sharp curettage is carried out.  Reinspection with the  hysteroscope again reveals a cavity without lesion.  The single-tooth  tenaculum is replaced with a Hulka tenaculum, and attention is turned to the  abdomen.  A small infraumbilical incision is made in the midline and  dissection is carried out in layers to the fascia.  The fascia is incised  under good visualization and anchoring sutures of 0 Vicryl are placed at  each angle.  This is then carried on down to the posterior layer of fascia,  which is sharply entered under good visualization and the peritoneum is  entered bluntly without difficulty.  The disposable open  laparoscopic trocar  sleeve is placed and anchored down with the angle sutures.  Pneumoperitoneum  is created and a small suprapubic incision is made and a 5 mm disposable  trocar sleeve is placed under direct visualization without difficulty.  The  above findings are noted.  A small incision is made in the peritoneum on the  left pelvic sidewall above the endometrial implants.  Hydrodissection is  then carried out to elevate the peritoneum away from the ureter.  The  heaviest implant of endometriosis is then resected sharply without  difficulty.  Hemostasis is obtained with bipolar cautery at the peritoneal  edges while tenting the peritoneum well away from the ureter.  The remaining  implants are all ablated with bipolar cautery.  Copious irrigation is  carried out, and all returned as clear.  The ureter is seen to peristalse  normally after procedure.  Excess fluid is removed.  The suprapubic trocar  sleeve is removed and inspection under reduced pneumoperitoneum again  reveals good hemostasis all around.  The umbilical trocar sleeve is removed.  The angle sutures are tied in the midline, which reapproximates the fascia  well.  Vicryl 2-0 suture is used in subcuticular fashion in the umbilical  incision.  Both incisions are injected with 0.5% plain Marcaine.  Dermabond  is used to close the skin on both incisions.  Hulka tenaculum is removed and  good hemostasis is noted.  All counts correct.  The patient is awakened and  taken to the recovery room in stable condition.                                               Guy Sandifer Arleta Creek, M.D.    JET/MEDQ  D:  03/23/2004  T:  03/23/2004  Job:  [id number]

## 2011-05-04 NOTE — Procedures (Signed)
NAMEAMORI, [name] NO.:  [id number]   MEDICAL RECORD NO.:  [id number]          PATIENT TYPE:  OUT   LOCATION:  SLEEP CENTER                 FACILITY[facility]  [facility]   PHYSICIAN:  Marcelyn Bruins, M.D. [facility] DATE OF BIRTH:  Nov 12, 1976   DATE OF STUDY:  08/17/2005                              NOCTURNAL POLYSOMNOGRAM   REFERRING PHYSICIAN:  Dr. Harold Hedge   INDICATION FOR THE STUDY:  Hypersomnia with sleep apnea. Epworth score: 15.   SLEEP ARCHITECTURE:  The patient had a total sleep time of 286 minutes and  never achieved slow wave sleep or REM. Sleep onset latency was normal. Sleep  efficiency was only 65% due to frequent awakenings and arousals.   IMPRESSION:  1.  Severe obstructive sleep apnea/hypopnea syndrome with a respiratory      disturbance index of 54 events per hour and oxygen desaturation as low      as 81%. The events were not positional. Treatment for this degree of      sleep apnea will primarily involve weight loss coupled with CPAP.  2.  Severe snoring noted throughout the study.  3.  No clinically significant cardiac arrhythmias.  4.  Large numbers of leg jerks with significant sleep disruption that is      probably due to the patient's known intrauterine pregnancy.           ______________________________  Marcelyn Bruins, M.D. [facility]  Diplomate, American Board of Sleep  Medicine     KC/MEDQ  D:  08/21/2005 14:23:41  T:  08/21/2005 15:50:57  Job:  [id number]

## 2011-05-04 NOTE — Discharge Summary (Signed)
[name], [name]              ACCOUNT NO.:  [id number]   MEDICAL RECORD NO.:  [id number]          PATIENT TYPE:  INP   LOCATION:  9306                          FACILITY:  [facility]   PHYSICIAN:  Freddy Finner, M.D.   DATE OF BIRTH:  Aug 12, 1976   DATE OF ADMISSION:  09/12/2005  DATE OF DISCHARGE:  09/16/2005                                 DISCHARGE SUMMARY   ADMISSION DIAGNOSES:  1.  Intrauterine pregnancy at 37 weeks estimated gestational age.  2.  Previous cesarean section, desires repeat.  3.  Gestational diabetes controlled by Glyburide.  4.  Multiparity desires permanent sterilization.   DISCHARGE DIAGNOSES:  1.  Status post low transverse cesarean section.  2.  Viable female infant.  3.  Permanent sterilization.   PROCEDURE:  1.  Repeat low transverse cesarean section.  2.  Bilateral tubal ligation.   REASON FOR ADMISSION:  Please see dictated H&P.   HOSPITAL COURSE:  The patient is a 35 year old white female, gravida 3, para  1, that was admitted to [facility] for a scheduled cesarean  section.  Pregnancy had been complicated by gestational diabetes controlled  with glyburide.  Amniocentesis had been performed approximately a week ago  which had revealed an LS ratio of 1.6 to 1 without PG.  The patient had been  scheduled for a cesarean delivery in approximately 1 week after the  amniocentesis.  Due to multiparity, the patient had also requested permanent  sterilization.  The patient was also known to have sleep apnea and had been  using a CPAP machine at home per Marcelyn Bruins, M.D. [facility].  On the morning of  admission, the patient was taken to the operating room where spinal  anesthesia was administered without difficulty.  A low transverse incision  was made with the delivery of a viable female infant weighing 8 pounds 3  ounces with Apgars of 4 at one minute, 8 at five minutes, 9 at 10 minutes.  Arterial cord pH was 7.00.  Bilateral tubal ligation was  also performed  without difficulty.  The patient tolerated the procedure well and was taken  to the recovery room in stable condition.  On postoperative day #1, the  patient was without complaint, vital signs were stable.  Accu-Chek revealed  values of 103 and 108.  Abdomen was soft.  Abdominal dressing was noted to  be clean, dry, and intact.  Laboratory findings revealed hemoglobin of 10.0,  platelet count of 201,000, WBC count of 11.5.  The patient had been started  back on her Glyburide.  On postoperative day #2, the patient was without  complaint, vital signs were stable, fundus was firm and nontender.  Incision  was clean, dry, and intact.  She was ambulating well.  On postoperative day  #3, the patient stated that she did not feel well.  She had some productive  coughing revealing green sputum.  She was afebrile.  Other vital signs were  stable.  Abdomen was soft.  Incision was clean, dry, and intact.  Staples  were removed and the patient was later discharged home.  CONDITION ON DISCHARGE:  Stable.   DIET:  Regular as tolerated.   ACTIVITY:  No heavy lifting, no driving x2 weeks, no vaginal entry.   FOLLOW UP:  The patient is to follow up in the office in 1 week for an  incision check.  She is to call for a temperature greater than 100 degrees,  persistent nausea and vomiting, heavy vaginal bleeding, and/or redness or  drainage from the incisional site.   DISCHARGE MEDICATIONS:  1.  Z-Pak as directed.  2.  Zoloft 150 mg daily.  3.  Percocet 5/325 #40 one p.o. every 4-6 hours p.r.n.  4.  Motrin 600 mg every 6 hours p.r.n.  5.  Prenatal vitamins one p.o. daily.      Julio Sicks, N.P.      Freddy Finner, M.D.  Electronically Signed    CC/MEDQ  D:  10/28/2005  T:  10/29/2005  Job:  [id number]

## 2011-05-04 NOTE — Discharge Summary (Signed)
[facility]  Patient:    [name], [name]                     MRN: [id number] Adm. Date:  [id number] Disc. Date: [id number] Attending:  Madelyn Flavors Dictator:   Danie Chandler, R.N.                           Discharge Summary  ADMISSION DIAGNOSES:          1. Intrauterine pregnancy at 40-4/[redacted] weeks                                  gestation.                               2. Nonstress test with decreased fetal heart                                  rate with recovery following end of uterine                                  contractions.  Admitted for induction.  DISCHARGE DIAGNOSES:          1. Intrauterine pregnancy at 40-4/[redacted] weeks                                  gestation.                               2. Nonstress test with decreased fetal heart                                  rate with recovery following end of uterine                                  contractions.  Admitted for induction.                               3. Failed induction and recurrent variable                                  decelerations and thick meconium-stained                                  amniotic fluid.  PROCEDURES:                   On May 16, 2000, primary low transverse cesarean section.  HISTORY OF PRESENT ILLNESS:   The patient is a 35 year old female, gravida 1, para 0, who was admitted for induction of labor on May 15, 2000, for variable decelerations on nonstress test and oligohydramnios.  The patient underwent induction of labor for greater than 12 hours  with no change in the cervix. Prior to her surgery, the cervix was fingertip, 80%, and very high.  The patient did have some fetal heart tracing variables during her trial of induction.  After discussion with the parents, was felt that cesarean section was indicated.  HOSPITAL COURSE:              The patient was taken to the operating room and underwent the above-named procedure without  complication.  This was productive of a viable female infant with Apgars of 3 at one minute, 8 at five minutes, and 9 at 10 minutes.  Postoperatively, the patient did well.  On postoperative day #1, the patient had a good return of bowel function and had good control of pain.  Her hemoglobin was 10.6, hematocrit 31.3, and white blood cell count 12.1.  On postoperative day #2, the patient was ambulating well without difficulty, had a good return of bowel function, and was tolerating a regular diet.  DISPOSITION:                  She was discharged home on postoperative day #3.  CONDITION ON DISCHARGE:       Good.  DIET:                         Regular as tolerated.  ACTIVITY:                     No heavy lifting, no driving, and no vaginal entry.  FOLLOW-UP:                    She is to follow up in the office in one to two weeks for incision check.  SPECIAL INSTRUCTIONS:         She is to call for temperature greater than 100 degrees, persistent nausea or vomiting, heavy vaginal bleeding, and/or redness or drainage from the incision site.  DISCHARGE MEDICATIONS:        1. Prenatal vitamins one to two p.o. q.d.                               2. Demerol one to two q.4-6h. p.r.n. pain. DD:  06/17/00 TD:  06/17/00 Job: [id number]

## 2011-05-04 NOTE — Op Note (Signed)
[name], [name]                        ACCOUNT NO.:  [id number]   MEDICAL RECORD NO.:  [id number]                   PATIENT TYPE:  OIB   LOCATION:  5731                                 FACILITY:  [facility]   PHYSICIAN:  [name], M.D.            DATE OF BIRTH:  01-26-1976   DATE OF PROCEDURE:  04/20/2003  DATE OF DISCHARGE:                                 OPERATIVE REPORT   PREOPERATIVE DIAGNOSIS:  Biliary dyskinesia.   POSTOPERATIVE DIAGNOSIS:  Biliary dyskinesia.   PROCEDURE:  Laparoscopic cholecystectomy with intraoperative cholangiogram.   SURGEON:  [name], M.D.   ASSISTANT:  [name]. [name], M.D.   ANESTHESIA:  General.   INDICATIONS:  The patient is a 35 year old female who for the past few  months has been having persistent right upper quadrant pain that is  worsening.  It radiates around to the right back and is associated with  nausea.  A proton pump inhibitor failed to help her.  Ultrasound did not  demonstrate gallstones.  A nuclear medicine hepatobiliary scan with CCK  injection demonstrated depressed gallbladder ejection fraction, and she had  severe pain during the CCK injection.  She now presents for elective  cholecystectomy.  The procedure and the risks, including the potential of an  80% success rate in relieving her symptoms, were explained to her  preoperatively.   DESCRIPTION OF PROCEDURE:  She was seen in the holding area, brought to the  operating room, placed supine on the operating table, and a general  anesthetic was administered.  Her abdomen was sterilely prepped and draped.  Marcaine 0.5% with epinephrine was infiltrated in the subumbilical region  and a small subumbilical region was made incising the skin and subcutaneous  tissue sharply.  The midline fascia was identified and a small incision made  in the midline fascia.  The peritoneal cavity was then entered bluntly and  under direct vision.  A pursestring suture of  0 Vicryl was placed around the  fascial edges.  A Hasson trocar was introduced to the peritoneal cavity and  pneumoperitoneum was created  by insufflation of CO2 gas.  Next the  laparoscope was introduced.  The liver appeared to normal architecture.  She  was placed in the reverse Trendelenburg position and the right side slightly  tilted up.  An 11 mm trocar was placed through an epigastric incision and  two 5 mm trocars were placed through right midabdominal incisions.  The  fundus of the gallbladder was grasped.  The gallbladder did not appear to be  acutely inflamed.  The fundus of the gallbladder was then retracted toward  the right shoulder.  The infundibulum was grasped and was mobilized using  blunt dissection and select electrocautery.  I then identified the cystic  duct and created a window around it.  A clip was placed just above the  cystic duct-gallbladder junction and an incision was made at  the cystic duct-  gallbladder junction.  A cholangiocatheter was placed through the anterior  abdominal wall into the cystic duct and a cholangiogram was performed.   Under real-time fluoroscopy dilute contrast material was injected into the  cystic duct.  The common bile duct filled promptly and drained into the  duodenum promptly without obvious symptoms of obstruction.  The common  hepatic duct and right hepatic duct and a small portion of the left hepatic  duct also filled.   The cholangiocatheter was removed and the cystic duct was clipped three  times proximally and divided.  An anterior and posterior branch of the  cystic artery both were identified, clipped, and divided.  The gallbladder  was dissected free from the liver bed intact with electrocautery.  Once the  gallbladder was free, the gallbladder fossa was irrigated and inspected and  no bleeding or bile leakage was noted.  The gallbladder was then removed  through the subumbilical incision intact.  The subumbilical fascial  defect  was closed under laparoscopic vision by tightening up and tying down the  pursestring suture. The irrigation fluid was evacuated and the two 5 mm  trocars were removed.  The pneumoperitoneum was then released and the  epigastric trocar was removed.   Skin incisions were then closed using running 4-0 Monocryl subcuticular  stitches.  Steri-Strips and sterile dressings were applied.   She tolerated the procedure well without any apparent complications, and she  was taken to the recovery room in satisfactory condition.  She had a LATEX  allergy and the entire case with respect to instruments, gloves, etc., was  kept latex-free.                                               [name], M.D.    [name]  D:  04/20/2003  T:  04/21/2003  Job:  [id number]   cc:   [name], M.D.  [address]  [address]  [place]  [place] [address]  Fax: [phone]   [name], M.D.  [address]  [place]  [place] [address]  Fax: [phone]

## 2011-05-04 NOTE — H&P (Signed)
[name], [name]                        ACCOUNT NO.:  [id number]   MEDICAL RECORD NO.:  [id number]                   PATIENT TYPE:  AMB   LOCATION:  SDC                                  FACILITY:  [facility]   PHYSICIAN:  Guy Sandifer. Arleta Creek, M.D.           DATE OF BIRTH:  1975/12/31   DATE OF ADMISSION:  03/23/2004  DATE OF DISCHARGE:                                HISTORY & PHYSICAL   CHIEF COMPLAINT:  Pelvic pain and heavy irregular vaginal bleeding.   HISTORY OF PRESENT ILLNESS:  Patient is a 35 year old separated white female  G1, P1, who has increasingly irregular heavy menses and pain with her  menses.  After discussion of the options she is being admitted for  laparoscopy, hysteroscopy, dilation and curettage.   PAST MEDICAL HISTORY:  1. Depression.  2. Asthma.  3. Migraine headaches.  4. History of cervical dysplasia.   PAST SURGICAL HISTORY:  1. Wisdom tooth extraction.  2. Laparoscopic cholecystectomy.   MEDICATIONS:  Klonopin, Paxil, Duradrin, trazodone p.r.n., albuterol inhaler  p.r.n.   ALLERGIES:  WELLBUTRIN leading to hives.   OBSTETRICAL HISTORY:  Cesarean section x1.   SOCIAL HISTORY:  Cigarettes one pack a day.  Denies drug or alcohol abuse.   REVIEW OF SYSTEMS:  NEUROLOGIC:  History of migraines as above.  PULMONARY:  History of asthma as above.  CARDIOLOGY:  Denies chest pain.  GI:  Denies  recent changes in bowel habits.   PHYSICAL EXAMINATION:  VITAL SIGNS:  Height 5 feet 3 inches.  Weight 225  pounds.  Blood pressure 98/62.  HEENT:  Without thyromegaly.  LUNGS:  Clear to auscultation.  HEART:  Regular rate and rhythm.  BACK:  Without CVA tenderness.  BREASTS:  Without mass, retraction, discharge.  ABDOMEN:  Soft, nontender, without masses.  PELVIC:  Vulva, vagina, cervix without lesion.  Uterus is normal size,  mobile, nontender.  Adnexa nontender without masses.  EXTREMITIES/NEUROLOGICAL EXAM:  Grossly within normal limits.   ASSESSMENT:  1. Menometrorrhagia.  2. Dysmenorrhea and premenstrual pain.   PLAN:  Laparoscopy, hysteroscopy, D&C.                                               Guy Sandifer Arleta Creek, M.D.    JET/MEDQ  D:  03/22/2004  T:  03/22/2004  Job:  [id number]

## 2011-05-04 NOTE — Op Note (Signed)
[facility]  Patient:    [name], [name]                     MRN: [id number] Proc. Date: 05/16/00 Adm. Date:  [id number] Disc. Date: [id number] Attending:  Madelyn Flavors                           Operative Report  PREOPERATIVE DIAGNOSES:       1. Intrauterine pregnancy at term.                               2. Failed induction.                               3. Recurrent variable decelerations.  POSTOPERATIVE DIAGNOSES:      1. Intrauterine pregnancy at term.                               2. Failed induction.                               3. Recurrent variable decelerations.                               4. Thick meconium stained amniotic fluid.  PROCEDURE:                    Primary low transverse cesarean section.  SURGEON:                      Willey Blade, M.D.  ANESTHESIA:                   Epidural.  COMPLICATIONS:                None.  FINDINGS:                     At 1733, through a low transverse uterine incision, a viable female infant was delivered without difficulty.  Upon entry into the intrauterine cavity, thick meconium stained fluid was noted.  APGAR were 3, 8 and 9.  The baby was a female weighing 8 pounds 0 ounces.  The baby did well.  The pelvis was visualized at the time of surgery and noted to be normal.  INDICATIONS:                  The patient is a 35 year old female, gravida 1, admitted for induction of labor on May 30 for variable decelerations on NST and oligohydramnios.  The patient underwent induction of labor for greater than 12 hours with no change in the cervix.  Her cervix prior to her surgery was fingertip, 80% and very high.  The patient did have some worrisome fetal heart tracing variables during her trial of induction.  After discussion with the parents, it was felt that cesarean section was indicated.  The risks and benefits were explained.  DESCRIPTION OF PROCEDURE:     The patient was taken to the  operating room, where a spinal anesthetic was administered.  The patient was placed on the operating table in the left  lateral tilt position.  The abdomen was prepped and draped in the usual sterile fashion with Betadine and sterile drapes.  A Foley catheter was inserted.  The abdomen was entered through a low transverse incision and carried down sharply in the usual fashion.  The peritoneum was atraumatically entered.  The vesicouterine peritoneum overlying the lower uterine segment was incised and a bladder flap was bluntly and sharply created over the lower uterine segment.  A bladder blade was placed behind the bladder.  The uterus was then entered through a low transverse incision, the membranes were entered with thick meconium noted.  The vertex was elevated into the incision and delivered.  The oropharynx and nasopharynx were then thoroughly bulb suctioned with the DeLee suction device.  Minimal fluid was obtained upon suction.  The cord was promptly clamped and cut and the baby handed promptly to the pediatricians in attendance.  APGAR were 3, 8 and 9 at one, five and ten minutes respectively.  The baby was a female weighing 8 pounds and delivered at 51.  The baby did well.  The placenta was then manually extracted intact with a three vessel cord without difficulty.  A pH was obtained prior to removal of the placenta.  This value is unknown.  The uterus was wiped clean thoroughly with a wet sponge. The uterine incision was then closed in a two layer fashion, the first layer a running interlocking suture of #1 Vicryl.  A second imbricating suture was placed across the primary suture line with a running stitch of #1 Vicryl, as well.  The pelvis was then thoroughly irrigated with copious amounts of irrigant and hemostasis was noted.  The pelvis was visualized and noted to be normal. Next, the rectus muscle and anterior peritoneum was closed with a running stitch of #1 Vicryl, as  well.  The fascia was then closed with two sutures of #1 Vicryl in a running fashion.  The subfascial areas and subcutaneous tissue were hemostatic.  The subcutaneous tissue was then thoroughly irrigated.  The skin was reapproximated with staples and a sterile dressing applied.  Final sponge, needle and instrument counts were correct x 3.  There were no perioperative complications.  The patient did receive cefotetan after cord clamp. DD:  06/03/00 TD:  06/05/00 Job: [id number]

## 2011-06-28 DIAGNOSIS — J45909 Unspecified asthma, uncomplicated: Secondary | ICD-10-CM | POA: Insufficient documentation

## 2011-06-28 DIAGNOSIS — Z720 Tobacco use: Secondary | ICD-10-CM | POA: Insufficient documentation

## 2011-06-28 DIAGNOSIS — R739 Hyperglycemia, unspecified: Secondary | ICD-10-CM | POA: Insufficient documentation

## 2011-09-20 LAB — CULTURE, BLOOD (ROUTINE X 2)

## 2011-09-20 LAB — DIFFERENTIAL
Basophils Absolute: 0.1 10*3/uL (ref 0.0–0.1)
Basophils Relative: 0 % (ref 0–1)
Eosinophils Absolute: 0.5 10*3/uL (ref 0.0–0.7)
Eosinophils Relative: 4 % (ref 0–5)
Monocytes Absolute: 0.3 10*3/uL (ref 0.1–1.0)

## 2011-09-20 LAB — CBC
HCT: 34.4 % — ABNORMAL LOW (ref 36.0–46.0)
Hemoglobin: 10.9 g/dL — ABNORMAL LOW (ref 12.0–15.0)
Hemoglobin: 11.7 g/dL — ABNORMAL LOW (ref 12.0–15.0)
MCHC: 34 g/dL (ref 30.0–36.0)
MCHC: 34.3 g/dL (ref 30.0–36.0)
MCV: 96.4 fL (ref 78.0–100.0)
Platelets: 249 10*3/uL (ref 150–400)
RDW: 14.1 % (ref 11.5–15.5)
RDW: 14.5 % (ref 11.5–15.5)
WBC: 9.8 10*3/uL (ref 4.0–10.5)

## 2011-09-20 LAB — URINALYSIS, ROUTINE W REFLEX MICROSCOPIC
Glucose, UA: NEGATIVE mg/dL
Nitrite: NEGATIVE
Protein, ur: NEGATIVE mg/dL
Urobilinogen, UA: 0.2 mg/dL (ref 0.0–1.0)

## 2011-09-20 LAB — COMPREHENSIVE METABOLIC PANEL
AST: 18 U/L (ref 0–37)
Albumin: 3.7 g/dL (ref 3.5–5.2)
Alkaline Phosphatase: 67 U/L (ref 39–117)
Chloride: 102 mEq/L (ref 96–112)
Creatinine, Ser: 0.65 mg/dL (ref 0.4–1.2)
GFR calc Af Amer: >60 mL/min (ref >60)
Potassium: 4.5 mEq/L (ref 3.5–5.1)
Sodium: 139 mEq/L (ref 135–145)
Total Bilirubin: 0.3 mg/dL (ref 0.3–1.2)

## 2011-09-20 LAB — URINE CULTURE

## 2013-02-18 ENCOUNTER — Other Ambulatory Visit: Payer: Self-pay | Admitting: Obstetrics and Gynecology

## 2013-02-23 ENCOUNTER — Emergency Department: Payer: Self-pay | Admitting: Emergency Medicine

## 2013-07-21 ENCOUNTER — Other Ambulatory Visit: Payer: Self-pay | Admitting: Obstetrics and Gynecology

## 2013-07-21 DIAGNOSIS — N6320 Unspecified lump in the left breast, unspecified quadrant: Secondary | ICD-10-CM

## 2013-08-04 ENCOUNTER — Inpatient Hospital Stay: Admission: RE | Admit: 2013-08-04 | Payer: Self-pay | Source: Ambulatory Visit

## 2013-08-04 ENCOUNTER — Other Ambulatory Visit: Payer: Self-pay

## 2013-09-09 ENCOUNTER — Ambulatory Visit: Payer: Self-pay | Admitting: Obstetrics and Gynecology

## 2013-09-09 LAB — CREATININE, SERUM: EGFR (African American): >60

## 2013-10-15 ENCOUNTER — Ambulatory Visit: Payer: Self-pay | Admitting: Pain Medicine

## 2013-10-28 ENCOUNTER — Ambulatory Visit: Payer: Self-pay | Admitting: Pain Medicine

## 2013-11-16 ENCOUNTER — Ambulatory Visit: Payer: Self-pay | Admitting: Pain Medicine

## 2013-11-24 ENCOUNTER — Ambulatory Visit: Payer: Self-pay | Admitting: Pain Medicine

## 2013-12-02 ENCOUNTER — Ambulatory Visit: Payer: Self-pay | Admitting: Pain Medicine

## 2014-01-19 ENCOUNTER — Ambulatory Visit: Payer: Self-pay | Admitting: Pain Medicine

## 2014-01-20 DIAGNOSIS — G8929 Other chronic pain: Secondary | ICD-10-CM | POA: Insufficient documentation

## 2014-02-03 ENCOUNTER — Ambulatory Visit: Payer: Self-pay | Admitting: Pain Medicine

## 2014-02-26 DIAGNOSIS — R102 Pelvic and perineal pain: Secondary | ICD-10-CM | POA: Insufficient documentation

## 2014-02-26 DIAGNOSIS — Z9884 Bariatric surgery status: Secondary | ICD-10-CM | POA: Insufficient documentation

## 2014-03-02 ENCOUNTER — Ambulatory Visit: Payer: Self-pay | Admitting: Pain Medicine

## 2014-03-05 DIAGNOSIS — M47816 Spondylosis without myelopathy or radiculopathy, lumbar region: Secondary | ICD-10-CM | POA: Insufficient documentation

## 2014-03-15 ENCOUNTER — Ambulatory Visit: Payer: Self-pay | Admitting: Pain Medicine

## 2014-04-01 ENCOUNTER — Ambulatory Visit: Payer: Self-pay | Admitting: Pain Medicine

## 2014-04-13 ENCOUNTER — Ambulatory Visit: Payer: Self-pay | Admitting: Gastroenterology

## 2014-04-13 HISTORY — PX: UPPER GASTROINTESTINAL ENDOSCOPY: SHX188

## 2014-04-13 HISTORY — PX: COLONOSCOPY: SHX174

## 2014-04-14 ENCOUNTER — Ambulatory Visit: Payer: Self-pay | Admitting: Pain Medicine

## 2014-04-15 LAB — PATHOLOGY REPORT

## 2014-04-21 ENCOUNTER — Ambulatory Visit: Payer: Self-pay | Admitting: Pain Medicine

## 2014-04-29 ENCOUNTER — Ambulatory Visit: Payer: Self-pay | Admitting: Pain Medicine

## 2014-05-12 DIAGNOSIS — D649 Anemia, unspecified: Secondary | ICD-10-CM | POA: Insufficient documentation

## 2014-05-19 ENCOUNTER — Ambulatory Visit: Payer: Self-pay | Admitting: Pain Medicine

## 2014-06-15 ENCOUNTER — Ambulatory Visit: Payer: Self-pay | Admitting: Pain Medicine

## 2014-06-21 DIAGNOSIS — K1379 Other lesions of oral mucosa: Secondary | ICD-10-CM | POA: Insufficient documentation

## 2014-06-23 ENCOUNTER — Ambulatory Visit: Payer: Self-pay | Admitting: Pain Medicine

## 2014-07-22 ENCOUNTER — Ambulatory Visit: Payer: Self-pay | Admitting: Pain Medicine

## 2014-07-28 ENCOUNTER — Ambulatory Visit: Payer: Self-pay | Admitting: Pain Medicine

## 2014-08-24 ENCOUNTER — Ambulatory Visit: Payer: Self-pay | Admitting: Pain Medicine

## 2014-09-20 ENCOUNTER — Ambulatory Visit: Payer: Self-pay | Admitting: Pain Medicine

## 2014-10-14 ENCOUNTER — Ambulatory Visit: Payer: Self-pay | Admitting: Pain Medicine

## 2014-11-04 ENCOUNTER — Ambulatory Visit: Payer: Self-pay | Admitting: Pain Medicine

## 2014-11-29 ENCOUNTER — Ambulatory Visit: Payer: Self-pay | Admitting: Pain Medicine

## 2014-12-16 ENCOUNTER — Emergency Department: Payer: Self-pay | Admitting: Emergency Medicine

## 2014-12-16 LAB — COMPREHENSIVE METABOLIC PANEL
Albumin: 3.4 g/dL (ref 3.4–5.0)
Alkaline Phosphatase: 110 U/L
Anion Gap: 4 — ABNORMAL LOW (ref 7–16)
BUN: 5 mg/dL — ABNORMAL LOW (ref 7–18)
Bilirubin,Total: 0.2 mg/dL (ref 0.2–1.0)
CHLORIDE: 101 mmol/L (ref 98–107)
CO2: 30 mmol/L (ref 21–32)
CREATININE: 0.65 mg/dL (ref 0.60–1.30)
Calcium, Total: 8.1 mg/dL — ABNORMAL LOW (ref 8.5–10.1)
EGFR (African American): >60
GLUCOSE: 104 mg/dL — AB (ref 65–99)
OSMOLALITY: 268 (ref 275–301)
Potassium: 3.9 mmol/L (ref 3.5–5.1)
SGOT(AST): 22 U/L (ref 15–37)
SGPT (ALT): 24 U/L
Sodium: 135 mmol/L — ABNORMAL LOW (ref 136–145)
Total Protein: 7 g/dL (ref 6.4–8.2)

## 2014-12-16 LAB — CBC WITH DIFFERENTIAL/PLATELET
BASOS ABS: 0 10*3/uL (ref 0.0–0.1)
Basophil %: 0.5 %
Eosinophil #: 0.1 10*3/uL (ref 0.0–0.7)
Eosinophil %: 0.8 %
HCT: 37.1 % (ref 35.0–47.0)
HGB: 12.2 g/dL (ref 12.0–16.0)
Lymphocyte #: 3.1 10*3/uL (ref 1.0–3.6)
Lymphocyte %: 34.9 %
MCH: 31.9 pg (ref 26.0–34.0)
MCHC: 32.8 g/dL (ref 32.0–36.0)
MCV: 97 fL (ref 80–100)
MONO ABS: 0.4 x10 3/mm (ref 0.2–0.9)
MONOS PCT: 4.8 %
NEUTROS ABS: 5.3 10*3/uL (ref 1.4–6.5)
NEUTROS PCT: 59 %
PLATELETS: 272 10*3/uL (ref 150–440)
RBC: 3.82 10*6/uL (ref 3.80–5.20)
RDW: 13.4 % (ref 11.5–14.5)
WBC: 9 10*3/uL (ref 3.6–11.0)

## 2014-12-16 LAB — URINALYSIS, COMPLETE
BLOOD: NEGATIVE
Bacteria: NONE SEEN
Bilirubin,UR: NEGATIVE
Glucose,UR: NEGATIVE mg/dL (ref 0–75)
Ketone: NEGATIVE
LEUKOCYTE ESTERASE: NEGATIVE
NITRITE: NEGATIVE
Ph: 6 (ref 4.5–8.0)
Protein: 30
SPECIFIC GRAVITY: 1.033 (ref 1.003–1.030)
WBC UR: 2 /HPF (ref 0–5)

## 2015-01-03 ENCOUNTER — Ambulatory Visit: Payer: Self-pay | Admitting: Pain Medicine

## 2015-02-01 ENCOUNTER — Ambulatory Visit: Payer: Self-pay | Admitting: Pain Medicine

## 2015-02-09 ENCOUNTER — Ambulatory Visit: Payer: Self-pay | Admitting: Pain Medicine

## 2015-03-01 ENCOUNTER — Ambulatory Visit: Payer: Self-pay | Admitting: Pain Medicine

## 2015-03-14 ENCOUNTER — Ambulatory Visit: Payer: Self-pay | Admitting: Pain Medicine

## 2015-03-30 ENCOUNTER — Ambulatory Visit
Admit: 2015-03-30 | Disposition: A | Discharge status: Home / Self Care | Payer: Self-pay | Attending: Pain Medicine | Admitting: Pain Medicine

## 2015-04-06 ENCOUNTER — Ambulatory Visit
Admit: 2015-04-06 | Disposition: A | Discharge status: Home / Self Care | Payer: Self-pay | Attending: Pain Medicine | Admitting: Pain Medicine

## 2015-04-28 ENCOUNTER — Ambulatory Visit: Payer: Medicare Other | Attending: Pain Medicine | Admitting: Pain Medicine

## 2015-04-28 ENCOUNTER — Encounter: Payer: Self-pay | Admitting: Pain Medicine

## 2015-04-28 ENCOUNTER — Encounter (INDEPENDENT_AMBULATORY_CARE_PROVIDER_SITE_OTHER): Payer: Self-pay

## 2015-04-28 VITALS — BP 115/70 | HR 66 | Temp 98.8°F | Resp 18 | Ht 64.0 in | Wt 245.0 lb

## 2015-04-28 DIAGNOSIS — M545 Low back pain: Secondary | ICD-10-CM | POA: Diagnosis present

## 2015-04-28 DIAGNOSIS — N809 Endometriosis, unspecified: Secondary | ICD-10-CM | POA: Insufficient documentation

## 2015-04-28 DIAGNOSIS — M47818 Spondylosis without myelopathy or radiculopathy, sacral and sacrococcygeal region: Secondary | ICD-10-CM

## 2015-04-28 DIAGNOSIS — M47816 Spondylosis without myelopathy or radiculopathy, lumbar region: Secondary | ICD-10-CM

## 2015-04-28 DIAGNOSIS — M25559 Pain in unspecified hip: Secondary | ICD-10-CM

## 2015-04-28 DIAGNOSIS — G5710 Meralgia paresthetica, unspecified lower limb: Secondary | ICD-10-CM | POA: Diagnosis not present

## 2015-04-28 DIAGNOSIS — M5136 Other intervertebral disc degeneration, lumbar region: Secondary | ICD-10-CM | POA: Insufficient documentation

## 2015-04-28 DIAGNOSIS — M461 Sacroiliitis, not elsewhere classified: Secondary | ICD-10-CM | POA: Insufficient documentation

## 2015-04-28 DIAGNOSIS — N803 Endometriosis of pelvic peritoneum, unspecified: Secondary | ICD-10-CM

## 2015-04-28 DIAGNOSIS — M47817 Spondylosis without myelopathy or radiculopathy, lumbosacral region: Secondary | ICD-10-CM | POA: Insufficient documentation

## 2015-04-28 DIAGNOSIS — M51369 Other intervertebral disc degeneration, lumbar region without mention of lumbar back pain or lower extremity pain: Secondary | ICD-10-CM | POA: Insufficient documentation

## 2015-04-28 MED ORDER — HYDROCODONE-ACETAMINOPHEN 7.5-500 MG/15ML PO SOLN: 15.0000 mL | Freq: Four times a day (QID) | ORAL | Status: DC

## 2015-04-28 MED ORDER — GABAPENTIN 100 MG PO CAPS: 100.0000 mg | ORAL_CAPSULE | Freq: Three times a day (TID) | ORAL | Status: DC

## 2015-04-28 MED ORDER — CYCLOBENZAPRINE HCL 10 MG PO TABS: ORAL_TABLET | ORAL | Status: DC

## 2015-04-28 NOTE — Progress Notes (Signed)
Patient is a 39 year old female returns to pain management Center for further evaluation and treatment of pain involving the lower back and extremity region with pain me into the buttocks on the left and right sides. Patient wishes to proceed with interventional treatment block of nerves to the sacroiliac joint which has relieved her pain significantly. We will consider patient for treatment at time of return appointment as discussed. We will also discussed patient [facility] of her pelvic pain. Patient has had significant improvement of her pain prior treatment performed in [facility] Management Center. Patient denies any trauma or change in events related to the cause change in symptomatology proceed with block of nerves to the sacroiliac joint at time her return appointment and patient will undergo evaluation [facility] [place] as planned as well.  Physical examination  There was tenderness to palpation of the cervical paraspinal musculature region of mild degree of minimal tenderness over the splenius capitis muscles minimal tenderness over the thoracic paraspinal musculature region with unremarkable Spurling's maneuver bilaterally equal grip strength. Palpation of the lumbar paraspinal muscles reproduced pain of moderate degree with severe tenderness of the lumbar facet region gluteal and piriformis muscles there was tenderness of the PSIS and PI's regions of moderate degree as well straight leg raising was tolerated to approximately 30. Abdomen without excessive tends to palpation no costovertebral tenderness noted. Positive Patrick's maneuver on the left as well as on the right.  Assessment  Degenerative disc disease lumbar spine  Sacroiliac joint dysfunction  Assessment syndrome  Endometriosis  Meralgia paresthetica    Plan  #1 continue Neurontin hydrocodone #2 performed block  nerves to the sacroiliac joint at return appointment #3 follow-up Dr. Gavin PottersGrandis for evaluation blood pressure and general condition #4 will  consider neurological and surgical evaluations #5 [facility] #6 patient to call should there be change in condition prior to appointment

## 2015-04-28 NOTE — Progress Notes (Signed)
   Subjective:    Patient ID: [name] DakinsShannon D Reichart, female    DOB: 10/27/1976, 39 y.o.   MRN: [id number]  HPI    Review of Systems     Objective:   Physical Exam        Assessment & Plan:

## 2015-04-28 NOTE — Patient Instructions (Addendum)
Continue present medications.  F/U PCP for evaliation of  BP and general medical  condition.  Schedule block of nerves to the sacroiliac joint of this month  F/U surgical evaluation.  F/U nrurological evaluation.  May consider radiofrequency rhizolysis or intraspinal procedures pending response to present treatment and F/U evaluation.  Patient to call Pain Management Center should patient have concerns prior to scheduled return appointment. GENERAL RISKS AND COMPLICATIONS  What are the risk, side effects and possible complications? Generally speaking, most procedures are safe.  However, with any procedure there are risks, side effects, and the possibility of complications.  The risks and complications are dependent upon the sites that are lesioned, or the type of nerve block to be performed.  The closer the procedure is to the spine, the more serious the risks are.  Great care is taken when placing the radio frequency needles, block needles or lesioning probes, but sometimes complications can occur. 1. Infection: Any time there is an injection through the skin, there is a risk of infection.  This is why sterile conditions are used for these blocks.  There are four possible types of infection. 1. Localized skin infection. 2. Central Nervous System Infection-This can be in the form of Meningitis, which can be deadly. 3. Epidural Infections-This can be in the form of an epidural abscess, which can cause pressure inside of the spine, causing compression of the spinal cord with subsequent paralysis. This would require an emergency surgery to decompress, and there are no guarantees that the patient would recover from the paralysis. 4. Discitis-This is an infection of the intervertebral discs.  It occurs in about 1% of discography procedures.  It is difficult to treat and it may lead to surgery.        2. Pain: the needles have to go through skin and soft tissues, will cause soreness.       3. Damage  to internal structures:  The nerves to be lesioned may be near blood vessels or    other nerves which can be potentially damaged.       4. Bleeding: Bleeding is more common if the patient is taking blood thinners such as  aspirin, Coumadin, Ticiid, Plavix, etc., or if he/she have some genetic predisposition  such as hemophilia. Bleeding into the spinal canal can cause compression of the spinal  cord with subsequent paralysis.  This would require an emergency surgery to  decompress and there are no guarantees that the patient would recover from the  paralysis.       5. Pneumothorax:  Puncturing of a lung is a possibility, every time a needle is introduced in  the area of the chest or upper back.  Pneumothorax refers to free air around the  collapsed lung(s), inside of the thoracic cavity (chest cavity).  Another two possible  complications related to a similar event would include: Hemothorax and Chylothorax.   These are variations of the Pneumothorax, where instead of air around the collapsed  lung(s), you may have blood or chyle, respectively.       6. Spinal headaches: They may occur with any procedures in the area of the spine.       7. Persistent CSF (Cerebro-Spinal Fluid) leakage: This is a rare problem, but may occur  with prolonged intrathecal or epidural catheters either due to the formation of a fistulous  track or a dural tear.       8. Nerve damage: By working so close to the spinal cord,  there is always a possibility of  nerve damage, which could be as serious as a permanent spinal cord injury with  paralysis.       9. Death:  Although rare, severe deadly allergic reactions known as "Anaphylactic  reaction" can occur to any of the medications used.      10. Worsening of the symptoms:  We can always make thing worse.  What are the chances of something like this happening? Chances of any of this occuring are extremely low.  By statistics, you have more of a chance of getting killed in a motor  vehicle accident: while driving to the hospital than any of the above occurring .  Nevertheless, you should be aware that they are possibilities.  In general, it is similar to taking a shower.  Everybody knows that you can slip, hit your head and get killed.  Does that mean that you should not shower again?  Nevertheless always keep in mind that statistics do not mean anything if you happen to be on the wrong side of them.  Even if a procedure has a 1 (one) in a 1,000,000 (million) chance of going wrong, it you happen to be that one..Also, keep in mind that by statistics, you have more of a chance of having something go wrong when taking medications.  Who should not have this procedure? If you are on a blood thinning medication (e.g. Coumadin, Plavix, see list of "Blood Thinners"), or if you have an active infection going on, you should not have the procedure.  If you are taking any blood thinners, please inform your physician.  How should I prepare for this procedure?  Do not eat or drink anything at least six hours prior to the procedure.  Bring a driver with you .  It cannot be a taxi.  Come accompanied by an adult that can drive you back, and that is strong enough to help you if your legs get weak or numb from the local anesthetic.  Take all of your medicines the morning of the procedure with just enough water to swallow them.  If you have diabetes, make sure that you are scheduled to have your procedure done first thing in the morning, whenever possible.  If you have diabetes, take only half of your insulin dose and notify our nurse that you have done so as soon as you arrive at the clinic.  If you are diabetic, but only take blood sugar pills (oral hypoglycemic), then do not take them on the morning of your procedure.  You may take them after you have had the procedure.  Do not take aspirin or any aspirin-containing medications, at least eleven (11) days prior to the procedure.  They may  prolong bleeding.  Wear loose fitting clothing that may be easy to take off and that you would not mind if it got stained with Betadine or blood.  Do not wear any jewelry or perfume  Remove any nail coloring.  It will interfere with some of our monitoring equipment.  NOTE: Remember that this is not meant to be interpreted as a complete list of all possible complications.  Unforeseen problems may occur.  BLOOD THINNERS The following drugs contain aspirin or other products, which can cause increased bleeding during surgery and should not be taken for 2 weeks prior to and 1 week after surgery.  If you should need take something for relief of minor pain, you may take acetaminophen which is found in Tylenol,m Datril,  Anacin-3 and Panadol. It is not blood thinner. The products listed below are.  Do not take any of the products listed below in addition to any listed on your instruction sheet.  A.P.C or A.P.C with Codeine Codeine Phosphate Capsules #3 Ibuprofen Ridaura  ABC compound Congesprin Imuran rimadil  Advil Cope Indocin Robaxisal  Alka-Seltzer Effervescent Pain Reliever and Antacid Coricidin or Coricidin-D  Indomethacin Rufen  Alka-Seltzer plus Cold Medicine Cosprin Ketoprofen S-A-C Tablets  Anacin Analgesic Tablets or Capsules Coumadin Korlgesic Salflex  Anacin Extra Strength Analgesic tablets or capsules CP-2 Tablets Lanoril Salicylate  Anaprox Cuprimine Capsules Levenox Salocol  Anexsia-D Dalteparin Magan Salsalate  Anodynos Darvon compound Magnesium Salicylate Sine-off  Ansaid Dasin Capsules Magsal Sodium Salicylate  Anturane Depen Capsules Marnal Soma  APF Arthritis pain formula Dewitt's Pills Measurin Stanback  Argesic Dia-Gesic Meclofenamic Sulfinpyrazone  Arthritis Bayer Timed Release Aspirin Diclofenac Meclomen Sulindac  Arthritis pain formula Anacin Dicumarol Medipren Supac  Analgesic (Safety coated) Arthralgen Diffunasal Mefanamic Suprofen  Arthritis Strength Bufferin  Dihydrocodeine Mepro Compound Suprol  Arthropan liquid Dopirydamole Methcarbomol with Aspirin Synalgos  ASA tablets/Enseals Disalcid Micrainin Tagament  Ascriptin Doan's Midol Talwin  Ascriptin A/D Dolene Mobidin Tanderil  Ascriptin Extra Strength Dolobid Moblgesic Ticlid  Ascriptin with Codeine Doloprin or Doloprin with Codeine Momentum Tolectin  Asperbuf Duoprin Mono-gesic Trendar  Aspergum Duradyne Motrin or Motrin IB Triminicin  Aspirin plain, buffered or enteric coated Durasal Myochrisine Trigesic  Aspirin Suppositories Easprin Nalfon Trillsate  Aspirin with Codeine Ecotrin Regular or Extra Strength Naprosyn Uracel  Atromid-S Efficin Naproxen Ursinus  Auranofin Capsules Elmiron Neocylate Vanquish  Axotal Emagrin Norgesic Verin  Azathioprine Empirin or Empirin with Codeine Normiflo Vitamin E  Azolid Emprazil Nuprin Voltaren  Bayer Aspirin plain, buffered or children's or timed BC Tablets or powders Encaprin Orgaran Warfarin Sodium  Buff-a-Comp Enoxaparin Orudis Zorpin  Buff-a-Comp with Codeine Equegesic Os-Cal-Gesic   Buffaprin Excedrin plain, buffered or Extra Strength Oxalid   Bufferin Arthritis Strength Feldene Oxphenbutazone   Bufferin plain or Extra Strength Feldene Capsules Oxycodone with Aspirin   Bufferin with Codeine Fenoprofen Fenoprofen Pabalate or Pabalate-SF   Buffets II Flogesic Panagesic   Buffinol plain or Extra Strength Florinal or Florinal with Codeine Panwarfarin   Buf-Tabs Flurbiprofen Penicillamine   Butalbital Compound Four-way cold tablets Penicillin   Butazolidin Fragmin Pepto-Bismol   Carbenicillin Geminisyn Percodan   Carna Arthritis Reliever Geopen Persantine   Carprofen Gold's salt Persistin   Chloramphenicol Goody's Phenylbutazone   Chloromycetin Haltrain Piroxlcam   Clmetidine heparin Plaquenil   Cllnoril Hyco-pap Ponstel   Clofibrate Hydroxy chloroquine Propoxyphen         Before stopping any of these medications, be sure to consult the  physician who ordered them.  Some, such as Coumadin (Warfarin) are ordered to prevent or treat serious conditions such as "deep thrombosis", "pumonary embolisms", and other heart problems.  The amount of time that you may need off of the medication may also vary with the medication and the reason for which you were taking it.  If you are taking any of these medications, please make sure you notify your pain physician before you undergo any procedures.         Sacroiliac (SI) Joint Injection Patient Information  Description: The sacroiliac joint connects the scrum (very low back and tailbone) to the ilium (a pelvic bone which also forms half of the hip joint).  Normally this joint experiences very little motion.  When this joint becomes inflamed or unstable low back and or hip and  pelvis pain may result.  Injection of this joint with local anesthetics (numbing medicines) and steroids can provide diagnostic information and reduce pain.  This injection is performed with the aid of x-ray guidance into the tailbone area while you are lying on your stomach.   You may experience an electrical sensation down the leg while this is being done.  You may also experience numbness.  We also may ask if we are reproducing your normal pain during the injection.  Conditions which may be treated SI injection:   Low back, buttock, hip or leg pain  Preparation for the Injection:  1. Do not eat any solid food or dairy products within 6 hours of your appointment.  2. You may drink clear liquids up to 2 hours before appointment.  Clear liquids include water, black coffee, juice or soda.  No milk or cream please. 3. You may take your regular medications, including pain medications with a sip of water before your appointment.  Diabetics should hold regular insulin (if take separately) and take 1/2 normal NPH dose the morning of the procedure.  Carry some sugar containing items with you to your appointment. 4. A  driver must accompany you and be prepared to drive you home after your procedure. 5. Bring all of your current medications with you. 6. An IV may be inserted and sedation may be given at the discretion of the physician. 7. A blood pressure cuff, EKG and other monitors will often be applied during the procedure.  Some patients may need to have extra oxygen administered for a short period.  8. You will be asked to provide medical information, including your allergies, prior to the procedure.  We must know immediately if you are taking blood thinners (like Coumadin/Warfarin) or if you are allergic to IV iodine contrast (dye).  We must know if you could possible be pregnant.  Possible side effects:   Bleeding from needle site  Infection (rare, may require surgery)  Nerve injury (rare)  Numbness & tingling (temporary)  A brief convulsion or seizure  Light-headedness (temporary)  Pain at injection site (several days)  Decreased blood pressure (temporary)  Weakness in the leg (temporary)   Call if you experience:   New onset weakness or numbness of an extremity below the injection site that last more than 8 hours.  Hives or difficulty breathing ( go to the emergency room)  Inflammation or drainage at the injection site  Any new symptoms which are concerning to you  Please note:  Although the local anesthetic injected can often make your back/ hip/ buttock/ leg feel good for several hours after the injections, the pain will likely return.  It takes 3-7 days for steroids to work in the sacroiliac area.  You may not notice any pain relief for at least that one week.  If effective, we will often do a series of three injections spaced 3-6 weeks apart to maximally decrease your pain.  After the initial series, we generally will wait some months before a repeat injection of the same type.  If you have any questions, please call [phone] [facility]  Continue present medications.  Block of nerves to  sacroiliac joint . Darylene Price please obtain insurance approval and we will perform as soon as possible We will try to perform on 05/09/2015  F/U surgical evaluation.  F/U nrurological evaluation.  May consider radiofrequency rhizolysis or intraspinal procedures pending response to present treatment and F/U evaluation.  Patient to call Pain Management Center should patient have concerns prior to scheduled return appointment.

## 2015-04-28 NOTE — Progress Notes (Signed)
Discharge patient home, ambulatory  At 0928 hrs Script given for hydrocodone (lortab) x 2 Teach back x3 done

## 2015-05-11 ENCOUNTER — Ambulatory Visit: Payer: Medicare Other | Attending: Pain Medicine | Admitting: Pain Medicine

## 2015-05-11 ENCOUNTER — Encounter: Payer: Self-pay | Admitting: Pain Medicine

## 2015-05-11 ENCOUNTER — Other Ambulatory Visit: Payer: Self-pay | Admitting: Pain Medicine

## 2015-05-11 VITALS — BP 108/54 | HR 55 | Temp 98.4°F | Resp 16 | Ht 64.0 in | Wt 248.0 lb

## 2015-05-11 DIAGNOSIS — N803 Endometriosis of pelvic peritoneum, unspecified: Secondary | ICD-10-CM

## 2015-05-11 DIAGNOSIS — M47816 Spondylosis without myelopathy or radiculopathy, lumbar region: Secondary | ICD-10-CM

## 2015-05-11 DIAGNOSIS — M545 Low back pain: Secondary | ICD-10-CM | POA: Diagnosis present

## 2015-05-11 DIAGNOSIS — M47818 Spondylosis without myelopathy or radiculopathy, sacral and sacrococcygeal region: Secondary | ICD-10-CM

## 2015-05-11 DIAGNOSIS — M461 Sacroiliitis, not elsewhere classified: Secondary | ICD-10-CM

## 2015-05-11 DIAGNOSIS — M533 Sacrococcygeal disorders, not elsewhere classified: Secondary | ICD-10-CM

## 2015-05-11 DIAGNOSIS — M51369 Other intervertebral disc degeneration, lumbar region without mention of lumbar back pain or lower extremity pain: Secondary | ICD-10-CM

## 2015-05-11 DIAGNOSIS — M5136 Other intervertebral disc degeneration, lumbar region: Secondary | ICD-10-CM

## 2015-05-11 DIAGNOSIS — M25559 Pain in unspecified hip: Secondary | ICD-10-CM

## 2015-05-11 MED ORDER — ORPHENADRINE CITRATE 30 MG/ML IJ SOLN
INTRAMUSCULAR | Status: AC
  Administered 2015-05-11: 08:00:00
  Filled 2015-05-11: qty 2

## 2015-05-11 MED ORDER — FENTANYL CITRATE (PF) 100 MCG/2ML IJ SOLN
INTRAMUSCULAR | Status: AC
  Administered 2015-05-11: 100 ug via INTRAVENOUS
  Filled 2015-05-11: qty 2

## 2015-05-11 MED ORDER — TRIAMCINOLONE ACETONIDE 40 MG/ML IJ SUSP
INTRAMUSCULAR | Status: AC
  Administered 2015-05-11: 08:00:00
  Filled 2015-05-11: qty 1

## 2015-05-11 MED ORDER — BUPIVACAINE HCL (PF) 0.25 % IJ SOLN
INTRAMUSCULAR | Status: AC
  Administered 2015-05-11: 08:00:00
  Filled 2015-05-11: qty 30

## 2015-05-11 MED ORDER — MIDAZOLAM HCL 5 MG/5ML IJ SOLN
INTRAMUSCULAR | Status: AC
  Administered 2015-05-11: 5 mg via INTRAVENOUS
  Filled 2015-05-11: qty 5

## 2015-05-11 NOTE — Progress Notes (Signed)
   Subjective:    Patient ID: [name], female    DOB: 03/21/1976, 39 y.o.   MRN: [id number]  HPI    Review of Systems     Objective:   Physical Exam        Assessment & Plan:

## 2015-05-11 NOTE — Patient Instructions (Addendum)
Continue present medications.  F/U PCP for evaliation of  BP and general medical  condition.  F/U surgical evaluation.  F/U neurological evaluation.  May consider radiofrequency rhizolysis or intraspinal procedures pending response to present treatment and F/U evaluation.  Patient to call [facility] should patient have concerns prior to scheduled return appointment. Selective Nerve Root Block Patient Information  Description: Specific nerve roots exit the spinal canal and these nerves can be compressed and inflamed by a bulging disc and bone spurs.  By injecting steroids on the nerve root, we can potentially decrease the inflammation surrounding these nerves, which often leads to decreased pain.  Also, by injecting local anesthesia on the nerve root, this can provide us helpful information to give to your referring doctor if it decreases your pain.  Selective nerve root blocks can be done along the spine from the neck to the low back depending on the location of your pain.   After numbing the skin with local anesthesia, a small needle is passed to the nerve root and the position of the needle is verified using x-ray pictures.  After the needle is in correct position, we then deposit the medication.  You may experience a pressure sensation while this is being done.  The entire block usually lasts less than 15 minutes.  Conditions that may be treated with selective nerve root blocks:  Low back and leg pain  Spinal stenosis  Diagnostic block prior to potential surgery  Neck and arm pain  Post laminectomy syndrome  Preparation for the injection:  1. Do not eat any solid food or dairy products within 6 hours of your appointment. 2. You may drink clear liquids up to 2 hours before an appointment.  Clear liquids include water, black coffee, juice or soda.  No milk or cream please. 3. You may take your regular medications, including pain medications, with a sip of water before  your appointment.  Diabetics should hold regular insulin (if taken separately) and take 1/2 normal NPH dose the morning of the procedure.  Carry some sugar containing items with you to your appointment. 4. A driver must accompany you and be prepared to drive you home after your procedure. 5. Bring all your current medications with you. 6. An IV may be inserted and sedation may be given at the discretion of the physician. 7. A blood pressure cuff, EKG, and other monitors will often be applied during the procedure.  Some patients may need to have extra oxygen administered for a short period. 8. You will be asked to provide medical information, including allergies, prior to the procedure.  We must know immediately if you are taking blood  Thinners (like Coumadin) or if you are allergic to IV iodine contrast (dye).  Possible side-effects: All are usually temporary  Bleeding from needle site  Light headedness  Numbness and tingling  Decreased blood pressure  Weakness in arms/legs  Pressure sensation in back/neck  Pain at injection site (several days)  Possible complications: All are extremely rare  Infection  Nerve injury  Spinal headache (a headache wore with upright position)  Call if you experience:  Fever/chills associated with headache or increased back/neck pain  Headache worsened by an upright position  New onset weakness or numbness of an extremity below the injection site  Hives or difficulty breathing (go to the emergency room)  Inflammation or drainage at the injection site(s)  Severe back/neck pain greater than usual  New symptoms which are concerning to you    Please note:  Although the local anesthetic injected can often make your back or neck feel good for several hours after the injection the pain will likely return.  It takes 3-5 days for steroids to work on the nerve root. You may not notice any pain relief for at least one week.  If effective, we  will often do a series of 3 injections spaced 3-6 weeks apart to maximally decrease your pain.    If you have any questions, please call [phone] [facility]  General Discharge Instructions :  If you need to reach your doctor call: Monday-Friday 8:00 am - 4:00 pm at [phone] or toll free [phone].  After clinic hours [phone] to have operator reach doctor.  Bring all of your medication bottles to all your appointments in the pain clinic.  To cancel or reschedule your appointment with Pain Management please remember to call 24 hours in advance to avoid a fee.  Refer to the educational materials which you have been given on: General Risks, I had my Procedure. Discharge Instructions, Post Sedation.  Post Procedure Instructions:  The drugs you were given will stay in your system until tomorrow, so for the next 24 hours you should not drive, make any legal decisions or drink any alcoholic beverages.  You may eat anything you prefer, but it is better to start with liquids then soups and crackers, and gradually work up to solid foods.  Please notify your doctor immediately if you have any unusual bleeding, trouble breathing or pain that is not related to your normal pain.  Depending on the type of procedure that was done, some parts of your body may feel week and/or numb.  This usually clears up by tonight or the next day.  Walk with the use of an assistive device or accompanied by an adult for the 24 hours.  You may use ice on the affected area for the first 24 hours.  Put ice in a Ziploc bag and cover with a towel and place against area 15 minutes on 15 minutes off.  You may switch to heat after 24 hours. 

## 2015-05-11 NOTE — Progress Notes (Signed)
Subjective:    Patient ID: [name], female    DOB: 02/23/1976, 39 y.o.   MRN: [id number]  HPI  PROCEDURE:  Block of nerves to the sacroiliac joint.   NOTE:  The patient is a 39 y.o. female who returns to the [facility] for further evaluation and treatment of pain involving the lower back and lower extremity region with pain in the region of the buttocks as well. Prior MRI studies reveal multilevel degenerative changes of the lumbar spine. The patient is with positive Patrick's maneuver and with severe increase of pain with palpation over the PSIS and PI IS regions consistent with significant sacroiliac joint dysfunction .  The risks, benefits, expectations of the procedure have been discussed and explained to the patient who is understanding and willing to proceed with interventional treatment in attempt to decrease severity of patient's symptoms, minimize the risk of medication escalation and  hopefully retard the progression of the patient's symptoms. We will proceed with what is felt to be a medically necessary procedure, block of nerves to the sacroiliac joint.   DESCRIPTION OF PROCEDURE:  Block of nerves to the sacroiliac joint.   The patient was taken to the fluoroscopy suite. With the patient in the prone position with EKG, blood pressure, pulse and pulse oximetry monitoring, IV Versed, IV fentanyl conscious sedation, Betadine prep of proposed entry site was performed.   Block of nerves at the L5 vertebral body level.   With the patient in prone position, under fluoroscopic guidance, a 22 -gauge needle was inserted at the L5 vertebral body level on the left side. With 15 degrees oblique orientation a 22 -gauge needle was inserted in the region known as Burton's eye or eye of the Scotty dog. Following documentation of needle placement in the area of Burton's eye or eye of the Scotty dog under fluoroscopic guidance, needle placement was then accomplished at the sacral ala  level on the left side.   Needle placement at the sacral ala.   With the patient in prone position under fluoroscopic guidance with AP view of the lumbosacral spine, a 22 -gauge needle was inserted in the region known as the sacral ala on the left side. Following documentation of needle placement on the left side under fluoroscopic guidance needle placement was then accomplished at the S1 foramen level.   Needle placement at the S1 foramen level.   With the patient in prone position under fluoroscopic guidance with AP view of the lumbosacral spine and cephalad orientation, a 22 -gauge needle was inserted at the superior and lateral border of the S1 foramen on the left side. Following documentation of needle placement at the S1 foramen level on the left side, needle placement was then accomplished at the S2 foramen level on the left side.   Needle placement at the S2 foramen level.   With the patient in prone position with AP view of the lumbosacral spine with cephalad orientation, a 22 - gauge needle was inserted at the superior and lateral border of the S2 foramen under fluoroscopic guidance on the left side. Following needle placement at the L5 vertebral body level, sacral ala, S1 foramen and S2 foramen on the left side, needle placement was verified on lateral view under fluoroscopic guidance.  Following needle placement documentation on lateral view, each needle was injected with 1 mL of 0.25% bupivacaine and Kenalog.   A total of 10 mg of Kenalog was utilized for the procedure.   Block of  nerves to the sacroiliac joint on the right side. The procedure was performed on the right side the same as was performed on the left side and at the same levels  PLAN:  1. Medications: The patient will continue presently prescribed medications.  2. The patient will be considered for modification of treatment regimen pending response to the procedure performed on today's visit.  3. The patient is to  follow-up with primary care physician for evaluation of blood pressure and general medical condition following the procedure performed on today's visit.  4. Surgical evaluation as discussed.  5. Neurological evaluation as discussed.  6. The patient may be a candidate for radiofrequency procedures, implantation devices and other treatment pending response to treatment performed on today's visit and follow-up evaluation.  7. The patient has been advised to adhere to proper body mechanics and to avoid activities which may exacerbate the patient's symptoms.   Return appointment to [facility] as scheduled.         Review of Systems     Objective:   Physical Exam        Assessment & Plan:

## 2015-05-11 NOTE — Progress Notes (Signed)
Dc instructions given.  Teach back 3 done.  Tol liquids. Discharged to home via wheelchair with husband at [phone]

## 2015-05-12 ENCOUNTER — Telehealth: Payer: Self-pay | Admitting: *Deleted

## 2015-05-12 NOTE — Telephone Encounter (Signed)
Left voicemail for patient to call for any problems post procedure. 

## 2015-05-31 ENCOUNTER — Telehealth: Payer: Self-pay

## 2015-05-31 ENCOUNTER — Encounter: Payer: Self-pay | Admitting: Pain Medicine

## 2015-05-31 ENCOUNTER — Ambulatory Visit: Payer: Medicare Other | Attending: Pain Medicine | Admitting: Pain Medicine

## 2015-05-31 VITALS — BP 108/54 | HR 67 | Temp 98.1°F | Resp 16 | Ht 64.0 in | Wt 248.0 lb

## 2015-05-31 DIAGNOSIS — N803 Endometriosis of pelvic peritoneum, unspecified: Secondary | ICD-10-CM

## 2015-05-31 DIAGNOSIS — M25559 Pain in unspecified hip: Secondary | ICD-10-CM

## 2015-05-31 DIAGNOSIS — M7060 Trochanteric bursitis, unspecified hip: Secondary | ICD-10-CM | POA: Insufficient documentation

## 2015-05-31 DIAGNOSIS — M545 Low back pain: Secondary | ICD-10-CM | POA: Diagnosis present

## 2015-05-31 DIAGNOSIS — M47818 Spondylosis without myelopathy or radiculopathy, sacral and sacrococcygeal region: Secondary | ICD-10-CM

## 2015-05-31 DIAGNOSIS — N809 Endometriosis, unspecified: Secondary | ICD-10-CM | POA: Diagnosis not present

## 2015-05-31 DIAGNOSIS — M47816 Spondylosis without myelopathy or radiculopathy, lumbar region: Secondary | ICD-10-CM

## 2015-05-31 DIAGNOSIS — M1288 Other specific arthropathies, not elsewhere classified, other specified site: Secondary | ICD-10-CM | POA: Insufficient documentation

## 2015-05-31 DIAGNOSIS — M5136 Other intervertebral disc degeneration, lumbar region: Secondary | ICD-10-CM

## 2015-05-31 DIAGNOSIS — R102 Pelvic and perineal pain: Secondary | ICD-10-CM | POA: Diagnosis not present

## 2015-05-31 DIAGNOSIS — M461 Sacroiliitis, not elsewhere classified: Secondary | ICD-10-CM

## 2015-05-31 DIAGNOSIS — M533 Sacrococcygeal disorders, not elsewhere classified: Secondary | ICD-10-CM | POA: Diagnosis not present

## 2015-05-31 DIAGNOSIS — M51369 Other intervertebral disc degeneration, lumbar region without mention of lumbar back pain or lower extremity pain: Secondary | ICD-10-CM

## 2015-05-31 MED ORDER — HYDROCODONE-ACETAMINOPHEN 7.5-500 MG/15ML PO SOLN: 15.0000 mL | Freq: Four times a day (QID) | ORAL | Status: DC

## 2015-05-31 MED ORDER — HYDROCODONE-ACETAMINOPHEN 7.5-325 MG/15ML PO SOLN: ORAL | Status: DC

## 2015-05-31 NOTE — Progress Notes (Signed)
   Subjective:    Patient ID: [name], female    DOB: 1976/09/14, 39 y.o.   MRN: [id number]  HPI  Patient is 39 year old female returns to [facility] for further evaluation and treatment of pain involving the lower back lower extremity region and pelvic region The patient states that her pain at this time involves the lower back on the left as well as on the right. The patient denies any trauma or change in events of daily living the call significant change in symptomatology patient states that the pain is aggravated by standing walking twisting and turning maneuvers. The pain interferes with patient's ability to obtain restful sleep and is increased with turning over in bed as well. Seated with lumbar facet, medial branch nerve, blocks at time return appointment in attempt to decrease severity of patient's symptoms hopefully retard progression of symptoms, avoid medication escalation, and avoid the need for more involved treatment. The patient is understanding and agrees with suggested treatment plan    Review of Systems     Objective:   Physical Exam There was mild tenderness over the splenius capitis and occipitalis musculature regions. Palpation of the acromioclavicular Glenohumeral joint regions reproduced mild discomfort and there was bilaterally equal grip strength with Tinel and Phalen's maneuver reproducing minimal discomfort. Palpation of the cervical and thoracic paraspinal musculature regions reproduced mild discomfort and no crepitus of the thoracic region was noted. Palpation of the lumbar paraspinal musculature region lumbar facet region was associated with increased pain with lateral bending and rotation and extension and palpation of the lumbar facets reproducing severe discomfort. Straight leg raising was tolerates approximately 30 without an increase of pain with dorsiflexion noted. There was increase of pain with palpation of the PSIS and PII S regions. There  was tenderness over the gluteal and piriformis musculature regions as well. EHL strength appeared to be slightly decreased without sensory deficit of dermatomal distribution detected. There was tennis along the greater trochanteric region iliotibial band region a moderate degree as well. Abdomen nontender in the upper and mid quadrants with some lower quadrant abdominal tenderness to palpation on the left as well as on the right. No costovertebral maintenance noted.      Assessment & Plan:  Degenerative disc disease lumbar spine Multilevel degenerative changes lumbar spine with L4-L5 level involving predominantly  Lumbar facet syndrome  Sacroiliac joint dysfunction  Greater trochanteric bursitis  Endometriosis with pelvic pain    Plan  Continue present medications. Oxycodone acetaminophen solution and Neurontin  Lumbar facet, medial branch nerve, blocks to be performed at time of return appointment  F/U PCP for evaliation of  BP and general medical  condition.  F/U surgical evaluation.  Appointment with Dr.Zolnoun as planned and as scheduled  F/U neurological evaluation.  May consider radiofrequency rhizolysis or intraspinal procedures pending response to present treatment and F/U evaluation.  Patient to call [facility] should patient have concerns prior to scheduled return appointment.

## 2015-05-31 NOTE — Patient Instructions (Addendum)
Continue present medications.  Lumbar facet, medial branch nerve, blocks at 06/13/2015  Appointment Dr.Zolnoun as planned and as scheduled  F/U PCP for evaliation of  BP and general medical  condition.  F/U surgical evaluation.  F/U neurological evaluation.  May consider radiofrequency rhizolysis or intraspinal procedures pending response to present treatment and F/U evaluation.  Patient to call [facility] should patient have concerns prior to scheduled return appointment. GENERAL RISKS AND COMPLICATIONS  What are the risk, side effects and possible complications? Generally speaking, most procedures are safe.  However, with any procedure there are risks, side effects, and the possibility of complications.  The risks and complications are dependent upon the sites that are lesioned, or the type of nerve block to be performed.  The closer the procedure is to the spine, the more serious the risks are.  Great care is taken when placing the radio frequency needles, block needles or lesioning probes, but sometimes complications can occur. 1. Infection: Any time there is an injection through the skin, there is a risk of infection.  This is why sterile conditions are used for these blocks.  There are four possible types of infection. 1. Localized skin infection. 2. Central Nervous System Infection-This can be in the form of Meningitis, which can be deadly. 3. Epidural Infections-This can be in the form of an epidural abscess, which can cause pressure inside of the spine, causing compression of the spinal cord with subsequent paralysis. This would require an emergency surgery to decompress, and there are no guarantees that the patient would recover from the paralysis. 4. Discitis-This is an infection of the intervertebral discs.  It occurs in about 1% of discography procedures.  It is difficult to treat and it may lead to surgery.        2. Pain: the needles have to go through skin and  soft tissues, will cause soreness.       3. Damage to internal structures:  The nerves to be lesioned may be near blood vessels or    other nerves which can be potentially damaged.       4. Bleeding: Bleeding is more common if the patient is taking blood thinners such as  aspirin, Coumadin, Ticiid, Plavix, etc., or if he/she have some genetic predisposition  such as hemophilia. Bleeding into the spinal canal can cause compression of the spinal  cord with subsequent paralysis.  This would require an emergency surgery to  decompress and there are no guarantees that the patient would recover from the  paralysis.       5. Pneumothorax:  Puncturing of a lung is a possibility, every time a needle is introduced in  the area of the chest or upper back.  Pneumothorax refers to free air around the  collapsed lung(s), inside of the thoracic cavity (chest cavity).  Another two possible  complications related to a similar event would include: Hemothorax and Chylothorax.   These are variations of the Pneumothorax, where instead of air around the collapsed  lung(s), you may have blood or chyle, respectively.       6. Spinal headaches: They may occur with any procedures in the area of the spine.       7. Persistent CSF (Cerebro-Spinal Fluid) leakage: This is a rare problem, but may occur  with prolonged intrathecal or epidural catheters either due to the formation of a fistulous  track or a dural tear.       8. Nerve damage: By working so  close to the spinal cord, there is always a possibility of  nerve damage, which could be as serious as a permanent spinal cord injury with  paralysis.       9. Death:  Although rare, severe deadly allergic reactions known as "Anaphylactic  reaction" can occur to any of the medications used.      10. Worsening of the symptoms:  We can always make thing worse.  What are the chances of something like this happening? Chances of any of this occuring are extremely low.  By statistics, you  have more of a chance of getting killed in a motor vehicle accident: while driving to the hospital than any of the above occurring .  Nevertheless, you should be aware that they are possibilities.  In general, it is similar to taking a shower.  Everybody knows that you can slip, hit your head and get killed.  Does that mean that you should not shower again?  Nevertheless always keep in mind that statistics do not mean anything if you happen to be on the wrong side of them.  Even if a procedure has a 1 (one) in a 1,000,000 (million) chance of going wrong, it you happen to be that one..Also, keep in mind that by statistics, you have more of a chance of having something go wrong when taking medications.  Who should not have this procedure? If you are on a blood thinning medication (e.g. Coumadin, Plavix, see list of "Blood Thinners"), or if you have an active infection going on, you should not have the procedure.  If you are taking any blood thinners, please inform your physician.  How should I prepare for this procedure?  Do not eat or drink anything at least six hours prior to the procedure.  Bring a driver with you .  It cannot be a taxi.  Come accompanied by an adult that can drive you back, and that is strong enough to help you if your legs get weak or numb from the local anesthetic.  Take all of your medicines the morning of the procedure with just enough water to swallow them.  If you have diabetes, make sure that you are scheduled to have your procedure done first thing in the morning, whenever possible.  If you have diabetes, take only half of your insulin dose and notify our nurse that you have done so as soon as you arrive at the clinic.  If you are diabetic, but only take blood sugar pills (oral hypoglycemic), then do not take them on the morning of your procedure.  You may take them after you have had the procedure.  Do not take aspirin or any aspirin-containing medications, at least  eleven (11) days prior to the procedure.  They may prolong bleeding.  Wear loose fitting clothing that may be easy to take off and that you would not mind if it got stained with Betadine or blood.  Do not wear any jewelry or perfume  Remove any nail coloring.  It will interfere with some of our monitoring equipment.  NOTE: Remember that this is not meant to be interpreted as a complete list of all possible complications.  Unforeseen problems may occur.  BLOOD THINNERS The following drugs contain aspirin or other products, which can cause increased bleeding during surgery and should not be taken for 2 weeks prior to and 1 week after surgery.  If you should need take something for relief of minor pain, you may take acetaminophen which  is found in Tylenol,m Datril, Anacin-3 and Panadol. It is not blood thinner. The products listed below are.  Do not take any of the products listed below in addition to any listed on your instruction sheet.  A.P.C or A.P.C with Codeine Codeine Phosphate Capsules #3 Ibuprofen Ridaura  ABC compound Congesprin Imuran rimadil  Advil Cope Indocin Robaxisal  Alka-Seltzer Effervescent Pain Reliever and Antacid Coricidin or Coricidin-D  Indomethacin Rufen  Alka-Seltzer plus Cold Medicine Cosprin Ketoprofen S-A-C Tablets  Anacin Analgesic Tablets or Capsules Coumadin Korlgesic Salflex  Anacin Extra Strength Analgesic tablets or capsules CP-2 Tablets Lanoril Salicylate  Anaprox Cuprimine Capsules Levenox Salocol  Anexsia-D Dalteparin Magan Salsalate  Anodynos Darvon compound Magnesium Salicylate Sine-off  Ansaid Dasin Capsules Magsal Sodium Salicylate  Anturane Depen Capsules Marnal Soma  APF Arthritis pain formula Dewitt's Pills Measurin Stanback  Argesic Dia-Gesic Meclofenamic Sulfinpyrazone  Arthritis Bayer Timed Release Aspirin Diclofenac Meclomen Sulindac  Arthritis pain formula Anacin Dicumarol Medipren Supac  Analgesic (Safety coated) Arthralgen Diffunasal  Mefanamic Suprofen  Arthritis Strength Bufferin Dihydrocodeine Mepro Compound Suprol  Arthropan liquid Dopirydamole Methcarbomol with Aspirin Synalgos  ASA tablets/Enseals Disalcid Micrainin Tagament  Ascriptin Doan's Midol Talwin  Ascriptin A/D Dolene Mobidin Tanderil  Ascriptin Extra Strength Dolobid Moblgesic Ticlid  Ascriptin with Codeine Doloprin or Doloprin with Codeine Momentum Tolectin  Asperbuf Duoprin Mono-gesic Trendar  Aspergum Duradyne Motrin or Motrin IB Triminicin  Aspirin plain, buffered or enteric coated Durasal Myochrisine Trigesic  Aspirin Suppositories Easprin Nalfon Trillsate  Aspirin with Codeine Ecotrin Regular or Extra Strength Naprosyn Uracel  Atromid-S Efficin Naproxen Ursinus  Auranofin Capsules Elmiron Neocylate Vanquish  Axotal Emagrin Norgesic Verin  Azathioprine Empirin or Empirin with Codeine Normiflo Vitamin E  Azolid Emprazil Nuprin Voltaren  Bayer Aspirin plain, buffered or children's or timed BC Tablets or powders Encaprin Orgaran Warfarin Sodium  Buff-a-Comp Enoxaparin Orudis Zorpin  Buff-a-Comp with Codeine Equegesic Os-Cal-Gesic   Buffaprin Excedrin plain, buffered or Extra Strength Oxalid   Bufferin Arthritis Strength Feldene Oxphenbutazone   Bufferin plain or Extra Strength Feldene Capsules Oxycodone with Aspirin   Bufferin with Codeine Fenoprofen Fenoprofen Pabalate or Pabalate-SF   Buffets II Flogesic Panagesic   Buffinol plain or Extra Strength Florinal or Florinal with Codeine Panwarfarin   Buf-Tabs Flurbiprofen Penicillamine   Butalbital Compound Four-way cold tablets Penicillin   Butazolidin Fragmin Pepto-Bismol   Carbenicillin Geminisyn Percodan   Carna Arthritis Reliever Geopen Persantine   Carprofen Gold's salt Persistin   Chloramphenicol Goody's Phenylbutazone   Chloromycetin Haltrain Piroxlcam   Clmetidine heparin Plaquenil   Cllnoril Hyco-pap Ponstel   Clofibrate Hydroxy chloroquine Propoxyphen         Before stopping any of  these medications, be sure to consult the physician who ordered them.  Some, such as Coumadin (Warfarin) are ordered to prevent or treat serious conditions such as "deep thrombosis", "pumonary embolisms", and other heart problems.  The amount of time that you may need off of the medication may also vary with the medication and the reason for which you were taking it.  If you are taking any of these medications, please make sure you notify your pain physician before you undergo any procedures.         Facet Joint Block The facet joints connect the bones of the spine (vertebrae). They make it possible for you to bend, twist, and make other movements with your spine. They also prevent you from overbending, overtwisting, and making other excessive movements.  A facet joint block is a procedure where  a numbing medicine (anesthetic) is injected into a facet joint. Often, a type of anti-inflammatory medicine called a steroid is also injected. A facet joint block may be done for two reasons:  2. Diagnosis. A facet joint block may be done as a test to see whether neck or back pain is caused by a worn-down or infected facet joint. If the pain gets better after a facet joint block, it means the pain is probably coming from the facet joint. If the pain does not get better, it means the pain is probably not coming from the facet joint.  3. Therapy. A facet joint block may be done to relieve neck or back pain caused by a facet joint. A facet joint block is only done as a therapy if the pain does not improve with medicine, exercise programs, physical therapy, and other forms of pain management. LET [facility] CARE PROVIDER KNOW ABOUT:   Any allergies you have.   All medicines you are taking, including vitamins, herbs, eyedrops, and over-the-counter medicines and creams.   Previous problems you or members of your family have had with the use of anesthetics.   Any blood disorders you have had.   Other  health problems you have. RISKS AND COMPLICATIONS Generally, having a facet joint block is safe. However, as with any procedure, complications can occur. Possible complications associated with having a facet joint block include:   Bleeding.   Injury to a nerve near the injection site.   Pain at the injection site.   Weakness or numbness in areas controlled by nerves near the injection site.   Infection.   Temporary fluid retention.   Allergic reaction to anesthetics or medicines used during the procedure. BEFORE THE PROCEDURE   Follow your health care provider's instructions if you are taking dietary supplements or medicines. You may need to stop taking them or reduce your dosage.   Do not take any new dietary supplements or medicines without asking your health care provider first.   Follow your health care provider's instructions about eating and drinking before the procedure. You may need to stop eating and drinking several hours before the procedure.   Arrange to have an adult drive you home after the procedure. PROCEDURE 12. You may need to remove your clothing and dress in an open-back gown so that your health care provider can access your spine.  13. The procedure will be done while you are lying on an X-ray table. Most of the time you will be asked to lie on your stomach, but you may be asked to lie in a different position if an injection will be made in your neck.  14. Special machines will be used to monitor your oxygen levels, heart rate, and blood pressure.  15. If an injection will be made in your neck, an intravenous (IV) tube will be inserted into one of your veins. Fluids and medicine will flow directly into your body through the IV tube.  16. The area over the facet joint where the injection will be made will be cleaned with an antiseptic soap. The surrounding skin will be covered with sterile drapes.  17. An anesthetic will be applied to your skin to  make the injection area numb. You may feel a temporary stinging or burning sensation.  18. A video X-ray machine will be used to locate the joint. A contrast dye may be injected into the facet joint area to help with locating the joint.  19. When the  joint is located, an anesthetic medicine will be injected into the joint through the needle.  20. Your health care provider will ask you whether you feel pain relief. If you do feel relief, a steroid may be injected to provide pain relief for a longer period of time. If you do not feel relief or feel only partial relief, additional injections of an anesthetic may be made in other facet joints.  21. The needle will be removed, the skin will be cleansed, and bandages will be applied.  AFTER THE PROCEDURE   You will be observed for 15-30 minutes before being allowed to go home. Do not drive. Have an adult drive you or take a taxi or public transportation instead.   If you feel pain relief, the pain will return in several hours or days when the anesthetic wears off.   You may feel pain relief 2-14 days after the procedure. The amount of time this relief lasts varies from person to person.   It is normal to feel some tenderness over the injected area(s) for 2 days following the procedure.   If you have diabetes, you may have a temporary increase in blood sugar. Document Released: 04/24/2007 Document Revised: 04/19/2014 Document Reviewed: 09/22/2012 [facility] Patient Information 2015 [place], [place]. This information is not intended to replace advice given to you by your health care provider. Make sure you discuss any questions you have with your health care provider.

## 2015-05-31 NOTE — Telephone Encounter (Signed)
Kori and Nurses,  Please have patient go to another pharmacy for medication refill

## 2015-05-31 NOTE — Progress Notes (Signed)
Safety precautions to be maintained throughout the outpatient stay will include: orient to surroundings, keep bed in low position, maintain call bell within reach at all times, provide assistance with transfer out of bed and ambulation.  Discharge patient home ambulatory at 0800 hrs Scripts given for hydrocodone (Hycet) and hydrocodone ( lortab) Procedure info given teach back 3 done

## 2015-05-31 NOTE — Telephone Encounter (Signed)
You wrote hydrocodone 7.5\ 500 solution and [facility] does not make that anymore. Please call and correct. [facility] # [phone]. She has 15 days to get that corrected. They will not fill until you call.

## 2015-06-05 DIAGNOSIS — F439 Reaction to severe stress, unspecified: Secondary | ICD-10-CM | POA: Insufficient documentation

## 2015-06-13 ENCOUNTER — Ambulatory Visit: Payer: Medicare Other | Attending: Pain Medicine | Admitting: Pain Medicine

## 2015-06-13 VITALS — BP 95/56 | HR 54 | Temp 98.1°F | Resp 16 | Ht 64.0 in | Wt 248.0 lb

## 2015-06-13 DIAGNOSIS — N803 Endometriosis of pelvic peritoneum, unspecified: Secondary | ICD-10-CM

## 2015-06-13 DIAGNOSIS — M51369 Other intervertebral disc degeneration, lumbar region without mention of lumbar back pain or lower extremity pain: Secondary | ICD-10-CM

## 2015-06-13 DIAGNOSIS — R1013 Epigastric pain: Secondary | ICD-10-CM

## 2015-06-13 DIAGNOSIS — M545 Low back pain: Secondary | ICD-10-CM | POA: Diagnosis present

## 2015-06-13 DIAGNOSIS — M47818 Spondylosis without myelopathy or radiculopathy, sacral and sacrococcygeal region: Secondary | ICD-10-CM

## 2015-06-13 DIAGNOSIS — M533 Sacrococcygeal disorders, not elsewhere classified: Secondary | ICD-10-CM

## 2015-06-13 DIAGNOSIS — M47816 Spondylosis without myelopathy or radiculopathy, lumbar region: Secondary | ICD-10-CM | POA: Diagnosis not present

## 2015-06-13 DIAGNOSIS — M461 Sacroiliitis, not elsewhere classified: Secondary | ICD-10-CM

## 2015-06-13 DIAGNOSIS — M79604 Pain in right leg: Secondary | ICD-10-CM | POA: Diagnosis present

## 2015-06-13 DIAGNOSIS — M79605 Pain in left leg: Secondary | ICD-10-CM | POA: Diagnosis present

## 2015-06-13 DIAGNOSIS — M5136 Other intervertebral disc degeneration, lumbar region: Secondary | ICD-10-CM

## 2015-06-13 DIAGNOSIS — M25559 Pain in unspecified hip: Secondary | ICD-10-CM

## 2015-06-13 MED ORDER — MIDAZOLAM HCL 5 MG/5ML IJ SOLN
INTRAMUSCULAR | Status: AC
  Administered 2015-06-13: 5 mg via INTRAVENOUS
  Filled 2015-06-13: qty 5

## 2015-06-13 MED ORDER — TRIAMCINOLONE ACETONIDE 40 MG/ML IJ SUSP
INTRAMUSCULAR | Status: AC
  Administered 2015-06-13: 10:00:00
  Filled 2015-06-13: qty 1

## 2015-06-13 MED ORDER — BUPIVACAINE HCL (PF) 0.25 % IJ SOLN
INTRAMUSCULAR | Status: AC
  Administered 2015-06-13: 10:00:00
  Filled 2015-06-13: qty 30

## 2015-06-13 MED ORDER — FENTANYL CITRATE (PF) 100 MCG/2ML IJ SOLN
INTRAMUSCULAR | Status: AC
  Administered 2015-06-13: 100 ug via INTRAVENOUS
  Filled 2015-06-13: qty 2

## 2015-06-13 MED ORDER — ORPHENADRINE CITRATE 30 MG/ML IJ SOLN
INTRAMUSCULAR | Status: AC
  Filled 2015-06-13: qty 2

## 2015-06-13 NOTE — Progress Notes (Signed)
Safety precautions to be maintained throughout the outpatient stay will include: orient to surroundings, keep bed in low position, maintain call bell within reach at all times, provide assistance with transfer out of bed and ambulation.  

## 2015-06-13 NOTE — Patient Instructions (Addendum)
Continue present medications. Neurontin and hydrocodone acetaminophen  F/U PCP for evaliation of  BP and general medical  condition.  F/U surgical evaluation.  F/U Dr. Orion Crook as discussed  F/U neurological evaluation.  May consider radiofrequency rhizolysis or intraspinal procedures pending response to present treatment and F/U evaluation.  Patient to call [facility] should patient have concerns prior to scheduled return appointment.   Pain Management Discharge Instructions  General Discharge Instructions :  If you need to reach your doctor call: Monday-Friday 8:00 am - 4:00 pm at [phone] or toll free [phone].  After clinic hours [phone] to have operator reach doctor.  Bring all of your medication bottles to all your appointments in the pain clinic.  To cancel or reschedule your appointment with Pain Management please remember to call 24 hours in advance to avoid a fee.  Refer to the educational materials which you have been given on: General Risks, I had my Procedure. Discharge Instructions, Post Sedation.  Post Procedure Instructions:  The drugs you were given will stay in your system until tomorrow, so for the next 24 hours you should not drive, make any legal decisions or drink any alcoholic beverages.  You may eat anything you prefer, but it is better to start with liquids then soups and crackers, and gradually work up to solid foods.  Please notify your doctor immediately if you have any unusual bleeding, trouble breathing or pain that is not related to your normal pain.  Depending on the type of procedure that was done, some parts of your body may feel week and/or numb.  This usually clears up by tonight or the next day.  Walk with the use of an assistive device or accompanied by an adult for the 24 hours.  You may use ice on the affected area for the first 24 hours.  Put ice in a Ziploc bag and cover with a towel and place against area 15  minutes on 15 minutes off.  You may switch to heat after 24 hours.

## 2015-06-13 NOTE — Progress Notes (Signed)
Subjective:    Patient ID: [name], female    DOB: July 29, 1976, 39 y.o.   MRN: [id number]  HPI  PROCEDURE PERFORMED: Lumbar facet (medial branch block)   NOTE: The patient is a 39 y.o. female who returns to [facility] for further evaluation and treatment of pain involving the lumbar and lower extremity region. 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 The risks, benefits, and expectations of the procedure have been discussed and explained to the patient who was understanding and in agreement with suggested treatment plan. We will proceed with interventional treatment as discussed and as explained to the patient who was understanding and wished to proceed with procedure as planned.   DESCRIPTION OF PROCEDURE: Lumbar facet (medial branch block) with IV Versed, IV fentanyl conscious sedation, EKG, blood pressure, pulse, and pulse oximetry monitoring. The procedure was performed with the patient in the prone position. Betadine prep of proposed entry site performed.   NEEDLE PLACEMENT AT: Left L 3 lumbar facet (medial branch block). Under fluoroscopic guidance with oblique orientation of 15 degrees, a 22-gauge needle was inserted at the L 3 vertebral body level with needle placed at the targeted area of Burton's Eye or Eye of the Scotty Dog with documentation of needle placement in the superior and lateral border of targeted area of Burton's Eye or Eye of the Scotty Dog with oblique orientation of 15 degrees. Following documentation of needle placement at the L 3 vertebral body level, needle placement was then accomplished at the L 4 vertebral body level.   NEEDLE PLACEMENT AT L4 and L5 VERTEBRAL BODY LEVELS ON THE LEFT SIDE The procedure was performed at the L4 and L5 vertebral body levels exactly as was performed at the L 3 vertebral  body level utilizing the same technique and under fluoroscopic guidance.  NEEDLE PLACEMENT AT THE SACRAL ALA with AP view of the lumbosacral spine. With the patient in the prone position, Betadine prep of proposed entry site accomplished, a 22 gauge needle was inserted in the region of the sacral ala (groove formed by the superior articulating process of S1 and the sacral wing). Following documentation of needle placement at the sacral ala,  needle placement was then accomplished at the S1 foramen level.   NEEDLE PLACEMENT AT THE S1 FORAMEN LEVEL under fluoroscopic guidance with AP view of the lumbosacral spine and cephalad orientation of the fluoroscope, a 22-gauge needle was placed at the superior and lateral border of the S1 foramen under fluoroscopic guidance. Following documentation of needle placement at the S1 foramen.   Needle placement was then verified at all levels on lateral view. Following documentation of needle placement at all levels on lateral view and following negative aspiration for heme and CSF, each level was injected with 1 mL of 0.25% bupivacaine with Kenalog.     LUMBAR FACET, MEDIAL BRANCH NERVE, BLOCKS PERFORMED ON THE RIGHT SIDE   The procedure was performed on the right side exactly as was performed on the left side at the same levels and utilizing the same technique under fluoroscopic guidance.     The patient tolerated the procedure well. A total of 40 mg of Kenalog was utilized for the procedure.   PLAN:  1. Medications: The patient will continue presently prescribed medications. Neurontin and hydrocodone acetaminophen solution 2. May consider modification of treatment regimen at time  of return appointment pending response to treatment rendered on today's visit. 3. The patient is to follow-up with primary care physician for further evaluation of blood pressure and general medical condition status post steroid injection performed on today's visit. 4. Surgical  follow-up evaluation. 5. Follow-up Dr.Zolnoun as discussed 6. Neurological follow-up evaluation. 7. The patient may be candidate for radiofrequency procedures, implantation type procedures, and other treatment pending response to treatment and follow-up evaluation. 8. The patient has been advised to call the Pain Management Center prior to scheduled return appointment should there be significant change in condition or should patient have other concerns regarding condition prior to scheduled return appointment.  The patient is understanding and in agreement with suggested treatment plan.   Review of Systems     Objective:   Physical Exam        Assessment & Plan:

## 2015-06-14 ENCOUNTER — Telehealth: Payer: Self-pay | Admitting: *Deleted

## 2015-06-14 NOTE — Telephone Encounter (Signed)
Denies complications post procedure. 

## 2015-06-24 ENCOUNTER — Other Ambulatory Visit: Payer: Self-pay | Admitting: Pain Medicine

## 2015-06-28 ENCOUNTER — Encounter: Payer: Self-pay | Admitting: Pain Medicine

## 2015-06-28 ENCOUNTER — Ambulatory Visit: Payer: Medicare Other | Attending: Pain Medicine | Admitting: Pain Medicine

## 2015-06-28 VITALS — BP 118/69 | HR 60 | Temp 98.3°F | Resp 18 | Ht 64.0 in | Wt 250.0 lb

## 2015-06-28 DIAGNOSIS — M79605 Pain in left leg: Secondary | ICD-10-CM | POA: Diagnosis present

## 2015-06-28 DIAGNOSIS — M47818 Spondylosis without myelopathy or radiculopathy, sacral and sacrococcygeal region: Secondary | ICD-10-CM

## 2015-06-28 DIAGNOSIS — N803 Endometriosis of pelvic peritoneum, unspecified: Secondary | ICD-10-CM

## 2015-06-28 DIAGNOSIS — M25559 Pain in unspecified hip: Secondary | ICD-10-CM

## 2015-06-28 DIAGNOSIS — M533 Sacrococcygeal disorders, not elsewhere classified: Secondary | ICD-10-CM | POA: Diagnosis not present

## 2015-06-28 DIAGNOSIS — M461 Sacroiliitis, not elsewhere classified: Secondary | ICD-10-CM

## 2015-06-28 DIAGNOSIS — M79604 Pain in right leg: Secondary | ICD-10-CM | POA: Diagnosis present

## 2015-06-28 DIAGNOSIS — N809 Endometriosis, unspecified: Secondary | ICD-10-CM | POA: Insufficient documentation

## 2015-06-28 DIAGNOSIS — M5136 Other intervertebral disc degeneration, lumbar region: Secondary | ICD-10-CM | POA: Insufficient documentation

## 2015-06-28 DIAGNOSIS — M545 Low back pain: Secondary | ICD-10-CM | POA: Diagnosis present

## 2015-06-28 DIAGNOSIS — M47816 Spondylosis without myelopathy or radiculopathy, lumbar region: Secondary | ICD-10-CM

## 2015-06-28 MED ORDER — CYCLOBENZAPRINE HCL 10 MG PO TABS: ORAL_TABLET | ORAL | Status: DC

## 2015-06-28 MED ORDER — HYDROCODONE-ACETAMINOPHEN 7.5-325 MG/15ML PO SOLN: ORAL | Status: DC

## 2015-06-28 NOTE — Progress Notes (Signed)
Safety precautions to be maintained throughout the outpatient stay will include: orient to surroundings, keep bed in low position, maintain call bell within reach at all times, provide assistance with transfer out of bed and ambulation.  

## 2015-06-28 NOTE — Progress Notes (Signed)
   Subjective:    Patient ID: [name], female    DOB: 08/20/1976, 39 y.o.   MRN: [id number]  HPI Patient is 39 year old female [facility] and treatment of pain involing  the lower back and lower extremity regions predominantly. Patient recently underwent evaluation of her pelvic pain with Dr.Zolnoun and will follow-up with her as planned. We may consider performing hypogastric plexus block for the pelvic pain ever the present time patient states that she has significant pain involving the lower back region. Patient denies trauma change in events of daily living the call significant change in symptomatology. We will schedule patient for lumbar facet, medial branch nerve, blocks to be performed at time of return appointment in attempt to decrease it. Patient's symptoms and retard progression of patient's symptoms. The patient was in agreement with suggested treatment plan    Review of Systems     Objective:   Physical Exam There was tenderness of the splenius capitis and occipitalis musculature regions of mild degree with mild tenderness over the region of the cervical facet cervical paraspinal musculature region. There was mild tinnitus of the thoracic facet thoracic paraspinal musculature region. No crepitus of the thoracic region was noted. There was unremarkable Spurling's maneuver. Tinel's and Phalen's maneuver were without increase of pain of significant degree. Palpation over the lumbar facet lumbar paraspinal musculature region was associated with severe pain. Extension and palpation over the lumbar facets reproduce severe discomfort on the left greater than the right. Straight leg raising limited to approximately 20 with no increased pain with dorsiflexion noted. Mild tinnitus of the greater trochanteric region iliotibial band region. No definite sensory deficit of dermatomal distribution was detected. There was negative clonus negative  Homans. There was mild tends on the greater trochanteric region iliotibial band region. Is to palpation of the lower abdominal region suprapubic region. No costovertebral angle tenderness was noted.       Assessment & Plan:  Degenerative disc disease lumbar spine  Lumbar facet syndrome  Sacroiliac joint dysfunction  Endometriosis    Plan  Continue present medications Neurontin and hydrocodone acetaminophen  Lumbar facet, medial branch nerve, blocks to be performed at time return appointment  F/U PCP for evaliation of  BP and general medical  condition.  F/U surgical evaluation  F/U neurological evaluation  F/U Dr Orion CrookZolnoun as discussed for evaluation of pelvic pain  Psych evaluation recommend psych evaluation. Patient may benefit from psych evaluation in terms of reduction of her pain  May consider radiofrequency rhizolysis or intraspinal procedures pending response to present treatment and F/U evaluation.  [facility] to call [facility] should patient have concerns prior to scheduled return appointment.

## 2015-06-28 NOTE — Patient Instructions (Addendum)
Continue present medications Neurontin Flexeril and hydrocodone acetaminophen  Lumbar facet, medial branch nerve, blocks to be performed Monday, 07/11/2015  F/U PCP for evaliation of  BP and general medical  condition.  F/U surgical evaluation  F/U Dr Orion CrookZolnoun as discussed . Please reschedule appointment for follow-up evaluation with Dr.Zolnoun  F/U neurological evaluation  May consider radiofrequency rhizolysis or intraspinal procedures pending response to present treatment and F/U evaluation.  Patient to call [facility] should patient have concerns prior to scheduled return appointment. GENERAL RISKS AND COMPLICATIONS  What are the risk, side effects and possible complications? Generally speaking, most procedures are safe.  However, with any procedure there are risks, side effects, and the possibility of complications.  The risks and complications are dependent upon the sites that are lesioned, or the type of nerve block to be performed.  The closer the procedure is to the spine, the more serious the risks are.  Great care is taken when placing the radio frequency needles, block needles or lesioning probes, but sometimes complications can occur. 1. Infection: Any time there is an injection through the skin, there is a risk of infection.  This is why sterile conditions are used for these blocks.  There are four possible types of infection. 1. Localized skin infection. 2. Central Nervous System Infection-This can be in the form of Meningitis, which can be deadly. 3. Epidural Infections-This can be in the form of an epidural abscess, which can cause pressure inside of the spine, causing compression of the spinal cord with subsequent paralysis. This would require an emergency surgery to decompress, and there are no guarantees that the patient would recover from the paralysis. 4. Discitis-This is an infection of the intervertebral discs.  It occurs in about 1% of discography procedures.   It is difficult to treat and it may lead to surgery.        2. Pain: the needles have to go through skin and soft tissues, will cause soreness.       3. Damage to internal structures:  The nerves to be lesioned may be near blood vessels or    other nerves which can be potentially damaged.       4. Bleeding: Bleeding is more common if the patient is taking blood thinners such as  aspirin, Coumadin, Ticiid, Plavix, etc., or if he/she have some genetic predisposition  such as hemophilia. Bleeding into the spinal canal can cause compression of the spinal  cord with subsequent paralysis.  This would require an emergency surgery to  decompress and there are no guarantees that the patient would recover from the  paralysis.       5. Pneumothorax:  Puncturing of a lung is a possibility, every time a needle is introduced in  the area of the chest or upper back.  Pneumothorax refers to free air around the  collapsed lung(s), inside of the thoracic cavity (chest cavity).  Another two possible  complications related to a similar event would include: Hemothorax and Chylothorax.   These are variations of the Pneumothorax, where instead of air around the collapsed  lung(s), you may have blood or chyle, respectively.       6. Spinal headaches: They may occur with any procedures in the area of the spine.       7. Persistent CSF (Cerebro-Spinal Fluid) leakage: This is a rare problem, but may occur  with prolonged intrathecal or epidural catheters either due to the formation of a fistulous  track or  a dural tear.       8. Nerve damage: By working so close to the spinal cord, there is always a possibility of  nerve damage, which could be as serious as a permanent spinal cord injury with  paralysis.       9. Death:  Although rare, severe deadly allergic reactions known as "Anaphylactic  reaction" can occur to any of the medications used.      10. Worsening of the symptoms:  We can always make thing worse.  What are the  chances of something like this happening? Chances of any of this occuring are extremely low.  By statistics, you have more of a chance of getting killed in a motor vehicle accident: while driving to the hospital than any of the above occurring .  Nevertheless, you should be aware that they are possibilities.  In general, it is similar to taking a shower.  Everybody knows that you can slip, hit your head and get killed.  Does that mean that you should not shower again?  Nevertheless always keep in mind that statistics do not mean anything if you happen to be on the wrong side of them.  Even if a procedure has a 1 (one) in a 1,000,000 (million) chance of going wrong, it you happen to be that one..Also, keep in mind that by statistics, you have more of a chance of having something go wrong when taking medications.  Who should not have this procedure? If you are on a blood thinning medication (e.g. Coumadin, Plavix, see list of "Blood Thinners"), or if you have an active infection going on, you should not have the procedure.  If you are taking any blood thinners, please inform your physician.  How should I prepare for this procedure?  Do not eat or drink anything at least six hours prior to the procedure.  Bring a driver with you .  It cannot be a taxi.  Come accompanied by an adult that can drive you back, and that is strong enough to help you if your legs get weak or numb from the local anesthetic.  Take all of your medicines the morning of the procedure with just enough water to swallow them.  If you have diabetes, make sure that you are scheduled to have your procedure done first thing in the morning, whenever possible.  If you have diabetes, take only half of your insulin dose and notify our nurse that you have done so as soon as you arrive at the clinic.  If you are diabetic, but only take blood sugar pills (oral hypoglycemic), then do not take them on the morning of your procedure.  You may  take them after you have had the procedure.  Do not take aspirin or any aspirin-containing medications, at least eleven (11) days prior to the procedure.  They may prolong bleeding.  Wear loose fitting clothing that may be easy to take off and that you would not mind if it got stained with Betadine or blood.  Do not wear any jewelry or perfume  Remove any nail coloring.  It will interfere with some of our monitoring equipment.  NOTE: Remember that this is not meant to be interpreted as a complete list of all possible complications.  Unforeseen problems may occur.  BLOOD THINNERS The following drugs contain aspirin or other products, which can cause increased bleeding during surgery and should not be taken for 2 weeks prior to and 1 week after surgery.  If  you should need take something for relief of minor pain, you may take acetaminophen which is found in Tylenol,m Datril, Anacin-3 and Panadol. It is not blood thinner. The products listed below are.  Do not take any of the products listed below in addition to any listed on your instruction sheet.  A.P.C or A.P.C with Codeine Codeine Phosphate Capsules #3 Ibuprofen Ridaura  ABC compound Congesprin Imuran rimadil  Advil Cope Indocin Robaxisal  Alka-Seltzer Effervescent Pain Reliever and Antacid Coricidin or Coricidin-D  Indomethacin Rufen  Alka-Seltzer plus Cold Medicine Cosprin Ketoprofen S-A-C Tablets  Anacin Analgesic Tablets or Capsules Coumadin Korlgesic Salflex  Anacin Extra Strength Analgesic tablets or capsules CP-2 Tablets Lanoril Salicylate  Anaprox Cuprimine Capsules Levenox Salocol  Anexsia-D Dalteparin Magan Salsalate  Anodynos Darvon compound Magnesium Salicylate Sine-off  Ansaid Dasin Capsules Magsal Sodium Salicylate  Anturane Depen Capsules Marnal Soma  APF Arthritis pain formula Dewitt's Pills Measurin Stanback  Argesic Dia-Gesic Meclofenamic Sulfinpyrazone  Arthritis Bayer Timed Release Aspirin Diclofenac Meclomen  Sulindac  Arthritis pain formula Anacin Dicumarol Medipren Supac  Analgesic (Safety coated) Arthralgen Diffunasal Mefanamic Suprofen  Arthritis Strength Bufferin Dihydrocodeine Mepro Compound Suprol  Arthropan liquid Dopirydamole Methcarbomol with Aspirin Synalgos  ASA tablets/Enseals Disalcid Micrainin Tagament  Ascriptin Doan's Midol Talwin  Ascriptin A/D Dolene Mobidin Tanderil  Ascriptin Extra Strength Dolobid Moblgesic Ticlid  Ascriptin with Codeine Doloprin or Doloprin with Codeine Momentum Tolectin  Asperbuf Duoprin Mono-gesic Trendar  Aspergum Duradyne Motrin or Motrin IB Triminicin  Aspirin plain, buffered or enteric coated Durasal Myochrisine Trigesic  Aspirin Suppositories Easprin Nalfon Trillsate  Aspirin with Codeine Ecotrin Regular or Extra Strength Naprosyn Uracel  Atromid-S Efficin Naproxen Ursinus  Auranofin Capsules Elmiron Neocylate Vanquish  Axotal Emagrin Norgesic Verin  Azathioprine Empirin or Empirin with Codeine Normiflo Vitamin E  Azolid Emprazil Nuprin Voltaren  Bayer Aspirin plain, buffered or children's or timed BC Tablets or powders Encaprin Orgaran Warfarin Sodium  Buff-a-Comp Enoxaparin Orudis Zorpin  Buff-a-Comp with Codeine Equegesic Os-Cal-Gesic   Buffaprin Excedrin plain, buffered or Extra Strength Oxalid   Bufferin Arthritis Strength Feldene Oxphenbutazone   Bufferin plain or Extra Strength Feldene Capsules Oxycodone with Aspirin   Bufferin with Codeine Fenoprofen Fenoprofen Pabalate or Pabalate-SF   Buffets II Flogesic Panagesic   Buffinol plain or Extra Strength Florinal or Florinal with Codeine Panwarfarin   Buf-Tabs Flurbiprofen Penicillamine   Butalbital Compound Four-way cold tablets Penicillin   Butazolidin Fragmin Pepto-Bismol   Carbenicillin Geminisyn Percodan   Carna Arthritis Reliever Geopen Persantine   Carprofen Gold's salt Persistin   Chloramphenicol Goody's Phenylbutazone   Chloromycetin Haltrain Piroxlcam   Clmetidine heparin  Plaquenil   Cllnoril Hyco-pap Ponstel   Clofibrate Hydroxy chloroquine Propoxyphen         Before stopping any of these medications, be sure to consult the physician who ordered them.  Some, such as Coumadin (Warfarin) are ordered to prevent or treat serious conditions such as "deep thrombosis", "pumonary embolisms", and other heart problems.  The amount of time that you may need off of the medication may also vary with the medication and the reason for which you were taking it.  If you are taking any of these medications, please make sure you notify your pain physician before you undergo any procedures.         Facet Joint Block The facet joints connect the bones of the spine (vertebrae). They make it possible for you to bend, twist, and make other movements with your spine. They also prevent you from overbending,  overtwisting, and making other excessive movements.  A facet joint block is a procedure where a numbing medicine (anesthetic) is injected into a facet joint. Often, a type of anti-inflammatory medicine called a steroid is also injected. A facet joint block may be done for two reasons:  2. Diagnosis. A facet joint block may be done as a test to see whether neck or back pain is caused by a worn-down or infected facet joint. If the pain gets better after a facet joint block, it means the pain is probably coming from the facet joint. If the pain does not get better, it means the pain is probably not coming from the facet joint.  3. Therapy. A facet joint block may be done to relieve neck or back pain caused by a facet joint. A facet joint block is only done as a therapy if the pain does not improve with medicine, exercise programs, physical therapy, and other forms of pain management. LET [facility] CARE PROVIDER KNOW ABOUT:   Any allergies you have.   All medicines you are taking, including vitamins, herbs, eyedrops, and over-the-counter medicines and creams.   Previous problems  you or members of your family have had with the use of anesthetics.   Any blood disorders you have had.   Other health problems you have. RISKS AND COMPLICATIONS Generally, having a facet joint block is safe. However, as with any procedure, complications can occur. Possible complications associated with having a facet joint block include:   Bleeding.   Injury to a nerve near the injection site.   Pain at the injection site.   Weakness or numbness in areas controlled by nerves near the injection site.   Infection.   Temporary fluid retention.   Allergic reaction to anesthetics or medicines used during the procedure. BEFORE THE PROCEDURE   Follow your health care provider's instructions if you are taking dietary supplements or medicines. You may need to stop taking them or reduce your dosage.   Do not take any new dietary supplements or medicines without asking your health care provider first.   Follow your health care provider's instructions about eating and drinking before the procedure. You may need to stop eating and drinking several hours before the procedure.   Arrange to have an adult drive you home after the procedure. PROCEDURE 12. You may need to remove your clothing and dress in an open-back gown so that your health care provider can access your spine.  13. The procedure will be done while you are lying on an X-ray table. Most of the time you will be asked to lie on your stomach, but you may be asked to lie in a different position if an injection will be made in your neck.  14. Special machines will be used to monitor your oxygen levels, heart rate, and blood pressure.  15. If an injection will be made in your neck, an intravenous (IV) tube will be inserted into one of your veins. Fluids and medicine will flow directly into your body through the IV tube.  16. The area over the facet joint where the injection will be made will be cleaned with an antiseptic  soap. The surrounding skin will be covered with sterile drapes.  17. An anesthetic will be applied to your skin to make the injection area numb. You may feel a temporary stinging or burning sensation.  18. A video X-ray machine will be used to locate the joint. A contrast dye may be injected  into the facet joint area to help with locating the joint.  19. When the joint is located, an anesthetic medicine will be injected into the joint through the needle.  20. Your health care provider will ask you whether you feel pain relief. If you do feel relief, a steroid may be injected to provide pain relief for a longer period of time. If you do not feel relief or feel only partial relief, additional injections of an anesthetic may be made in other facet joints.  21. The needle will be removed, the skin will be cleansed, and bandages will be applied.  AFTER THE PROCEDURE   You will be observed for 15-30 minutes before being allowed to go home. Do not drive. Have an adult drive you or take a taxi or public transportation instead.   If you feel pain relief, the pain will return in several hours or days when the anesthetic wears off.   You may feel pain relief 2-14 days after the procedure. The amount of time this relief lasts varies from person to person.   It is normal to feel some tenderness over the injected area(s) for 2 days following the procedure.   If you have diabetes, you may have a temporary increase in blood sugar. Document Released: 04/24/2007 Document Revised: 04/19/2014 Document Reviewed: 09/22/2012 [facility] Patient Information 2015 [place], [place]. This information is not intended to replace advice given to you by your health care provider. Make sure you discuss any questions you have with your health care provider.

## 2015-06-28 NOTE — Progress Notes (Signed)
Discharged to home ambulatory.  Script in hand for hydrocodone liquid x 2 for a total of 3o day supply.  Return in 1 month.

## 2015-07-11 ENCOUNTER — Ambulatory Visit: Payer: Medicare Other | Attending: Pain Medicine | Admitting: Pain Medicine

## 2015-07-11 ENCOUNTER — Encounter: Payer: Self-pay | Admitting: Pain Medicine

## 2015-07-11 VITALS — BP 91/53 | HR 58 | Temp 98.7°F | Resp 14 | Ht 64.0 in | Wt 265.0 lb

## 2015-07-11 DIAGNOSIS — M1288 Other specific arthropathies, not elsewhere classified, other specified site: Secondary | ICD-10-CM | POA: Diagnosis not present

## 2015-07-11 DIAGNOSIS — M47816 Spondylosis without myelopathy or radiculopathy, lumbar region: Secondary | ICD-10-CM | POA: Diagnosis not present

## 2015-07-11 DIAGNOSIS — M25559 Pain in unspecified hip: Secondary | ICD-10-CM

## 2015-07-11 DIAGNOSIS — M545 Low back pain: Secondary | ICD-10-CM | POA: Diagnosis present

## 2015-07-11 DIAGNOSIS — M79605 Pain in left leg: Secondary | ICD-10-CM | POA: Diagnosis present

## 2015-07-11 DIAGNOSIS — M533 Sacrococcygeal disorders, not elsewhere classified: Secondary | ICD-10-CM

## 2015-07-11 DIAGNOSIS — N803 Endometriosis of pelvic peritoneum, unspecified: Secondary | ICD-10-CM

## 2015-07-11 DIAGNOSIS — M461 Sacroiliitis, not elsewhere classified: Secondary | ICD-10-CM

## 2015-07-11 DIAGNOSIS — M5136 Other intervertebral disc degeneration, lumbar region: Secondary | ICD-10-CM

## 2015-07-11 DIAGNOSIS — M79604 Pain in right leg: Secondary | ICD-10-CM | POA: Diagnosis present

## 2015-07-11 DIAGNOSIS — M47818 Spondylosis without myelopathy or radiculopathy, sacral and sacrococcygeal region: Secondary | ICD-10-CM

## 2015-07-11 MED ORDER — FENTANYL CITRATE (PF) 100 MCG/2ML IJ SOLN
INTRAMUSCULAR | Status: AC
  Administered 2015-07-11: 100 ug via INTRAVENOUS
  Filled 2015-07-11: qty 2

## 2015-07-11 MED ORDER — MIDAZOLAM HCL 5 MG/5ML IJ SOLN
INTRAMUSCULAR | Status: AC
  Administered 2015-07-11: 3 mg via INTRAVENOUS
  Filled 2015-07-11: qty 5

## 2015-07-11 MED ORDER — BUPIVACAINE HCL (PF) 0.25 % IJ SOLN
INTRAMUSCULAR | Status: AC
  Administered 2015-07-11: 20 mL
  Filled 2015-07-11: qty 30

## 2015-07-11 MED ORDER — ORPHENADRINE CITRATE 30 MG/ML IJ SOLN
INTRAMUSCULAR | Status: AC
  Filled 2015-07-11: qty 2

## 2015-07-11 MED ORDER — TRIAMCINOLONE ACETONIDE 40 MG/ML IJ SUSP
INTRAMUSCULAR | Status: AC
  Administered 2015-07-11: 40 mg
  Filled 2015-07-11: qty 1

## 2015-07-11 NOTE — Progress Notes (Signed)
Subjective:    Patient ID: [name], female    DOB: 10-Jul-1976, 39 y.o.   MRN: [id number]  HPI PROCEDURE PERFORMED: Lumbar facet (medial branch block)   NOTE: The patient is a 39 y.o. female who [facility] evaluation and treatment of pain involving the lumbar and lower extremity region. MRI  revealed the patient to be with evidence of multilevel degenerative changes lumbar spine. There is concern regarding significant component of patient's pain being due to facet arthropathy with significant facet syndrome. The risks, benefits, and expectations of the procedure have been discussed and explained to the patient who was understanding and in agreement with suggested treatment plan. We will proceed with interventional treatment as discussed and as explained to the patient who was understanding and wished to proceed with procedure as planned.   DESCRIPTION OF PROCEDURE: Lumbar facet (medial branch block) with IV Versed, IV fentanyl conscious sedation, EKG, blood pressure, pulse, and pulse oximetry monitoring. The procedure was performed with the patient in the prone position. Betadine prep of proposed entry site performed.   NEEDLE PLACEMENT AT: Left L 3 lumbar facet (medial branch block). Under fluoroscopic guidance with oblique orientation of 15 degrees, a 22-gauge needle was inserted at the L 3 vertebral body level with needle placed at the targeted area of Burton's Eye or Eye of the Scotty Dog with documentation of needle placement in the superior and lateral border of targeted area of Burton's Eye or Eye of the Scotty Dog with oblique orientation of 15 degrees. Following documentation of needle placement at the L 3 vertebral body level, needle placement was then accomplished at the L 4 vertebral body level.   NEEDLE PLACEMENT AT L4 and L5 VERTEBRAL BODY LEVELS ON THE LEFT SIDE The procedure was performed at the L4 and L5 vertebral body levels exactly as was  performed at the L 3 vertebral body level utilizing the same technique and under fluoroscopic guidance.  NEEDLE PLACEMENT AT THE SACRAL ALA with AP view of the lumbosacral spine. With the patient in the prone position, Betadine prep of proposed entry site accomplished, a 22 gauge needle was inserted in the region of the sacral ala (groove formed by the superior articulating process of S1 and the sacral wing). Following documentation of needle placement at the sacral ala,  needle placement was then accomplished at the S1 foramen level.   NEEDLE PLACEMENT AT THE S1 FORAMEN LEVEL under fluoroscopic guidance with AP view of the lumbosacral spine and cephalad orientation of the fluoroscope, a 22-gauge needle was placed at the superior and lateral border of the S1 foramen under fluoroscopic guidance. Following documentation of needle placement at the S1 foramen.   Needle placement was then verified at all levels on lateral view. Following documentation of needle placement at all levels on lateral view and following negative aspiration for heme and CSF, each level was injected with 1 mL of 0.25% bupivacaine with Kenalog.     LUMBAR FACET, MEDIAL BRANCH NERVE, BLOCKS PERFORMED ON THE RIGHT SIDE   The procedure was performed on the right side exactly as was performed on the left side at the same levels and utilizing the same technique under fluoroscopic guidance.     The patient tolerated the procedure well. A total of 40 mg of Kenalog was utilized for the procedure.   PLAN:  1. Medications: The patient will continue presently prescribed medications Neurontin and hydrocodone acetaminophen. 2. May consider modification of treatment regimen at  time of return appointment pending response to treatment rendered on today's visit. 3. The patient is to follow-up with primary care physician Dr. Gavin Potters for further evaluation of blood pressure and general medical condition status post steroid injection  performed on today's visit 4. F/U Dr Orion Crook as discussed. 5. Surgical follow-up evaluation. 6. Neurological follow-up evaluation. 7. The patient may be candidate for radiofrequency procedures, implantation type procedures, and other treatment pending response to treatment and follow-up evaluation. 8. The patient has been advised to call the [facility] prior to scheduled return appointment should there be significant change in condition or should patient have other concerns regarding condition prior to scheduled return appointment.  The patient is understanding and in agreement with suggested treatment plan.      Review of Systems     Objective:   Physical Exam        Assessment & Plan:

## 2015-07-11 NOTE — Patient Instructions (Addendum)
Continue present medication hydrocodone acetaminophen  F/U PCP Dr. Gavin Potters for evaliation of  BP and general medical  condition.  F/U surgical evaluation  F/U Dr Orion Crook   F/U neurological evaluation  May consider radiofrequency rhizolysis or intraspinal procedures pending response to present treatment and F/U evaluation.  Patient to call [facility] should patient have concerns prior to scheduled return appointment. Pain Management Discharge Instructions  General Discharge Instructions :  If you need to reach your doctor call: Monday-Friday 8:00 am - 4:00 pm at [phone] or toll free [phone].  After clinic hours [phone] to have operator reach doctor.  Bring all of your medication bottles to all your appointments in the pain clinic.  To cancel or reschedule your appointment with Pain Management please remember to call 24 hours in advance to avoid a fee.  Refer to the educational materials which you have been given on: General Risks, I had my Procedure. Discharge Instructions, Post Sedation.  Post Procedure Instructions:  The drugs you were given will stay in your system until tomorrow, so for the next 24 hours you should not drive, make any legal decisions or drink any alcoholic beverages.  You may eat anything you prefer, but it is better to start with liquids then soups and crackers, and gradually work up to solid foods.  Please notify your doctor immediately if you have any unusual bleeding, trouble breathing or pain that is not related to your normal pain.  Depending on the type of procedure that was done, some parts of your body may feel week and/or numb.  This usually clears up by tonight or the next day.  Walk with the use of an assistive device or accompanied by an adult for the 24 hours.  You may use ice on the affected area for the first 24 hours.  Put ice in a Ziploc bag and cover with a towel and place against area 15 minutes on 15 minutes  off.  You may switch to heat after 24 hours.Facet Joint Block The facet joints connect the bones of the spine (vertebrae). They make it possible for you to bend, twist, and make other movements with your spine. They also prevent you from overbending, overtwisting, and making other excessive movements.  A facet joint block is a procedure where a numbing medicine (anesthetic) is injected into a facet joint. Often, a type of anti-inflammatory medicine called a steroid is also injected. A facet joint block may be done for two reasons:   Diagnosis. A facet joint block may be done as a test to see whether neck or back pain is caused by a worn-down or infected facet joint. If the pain gets better after a facet joint block, it means the pain is probably coming from the facet joint. If the pain does not get better, it means the pain is probably not coming from the facet joint.   Therapy. A facet joint block may be done to relieve neck or back pain caused by a facet joint. A facet joint block is only done as a therapy if the pain does not improve with medicine, exercise programs, physical therapy, and other forms of pain management. LET [facility] CARE PROVIDER KNOW ABOUT:   Any allergies you have.   All medicines you are taking, including vitamins, herbs, eyedrops, and over-the-counter medicines and creams.   Previous problems you or members of your family have had with the use of anesthetics.   Any blood disorders you have had.  Other health problems you have. RISKS AND COMPLICATIONS Generally, having a facet joint block is safe. However, as with any procedure, complications can occur. Possible complications associated with having a facet joint block include:   Bleeding.   Injury to a nerve near the injection site.   Pain at the injection site.   Weakness or numbness in areas controlled by nerves near the injection site.   Infection.   Temporary fluid retention.   Allergic  reaction to anesthetics or medicines used during the procedure. BEFORE THE PROCEDURE   Follow your health care provider's instructions if you are taking dietary supplements or medicines. You may need to stop taking them or reduce your dosage.   Do not take any new dietary supplements or medicines without asking your health care provider first.   Follow your health care provider's instructions about eating and drinking before the procedure. You may need to stop eating and drinking several hours before the procedure.   Arrange to have an adult drive you home after the procedure. PROCEDURE  You may need to remove your clothing and dress in an open-back gown so that your health care provider can access your spine.   The procedure will be done while you are lying on an X-ray table. Most of the time you will be asked to lie on your stomach, but you may be asked to lie in a different position if an injection will be made in your neck.   Special machines will be used to monitor your oxygen levels, heart rate, and blood pressure.   If an injection will be made in your neck, an intravenous (IV) tube will be inserted into one of your veins. Fluids and medicine will flow directly into your body through the IV tube.   The area over the facet joint where the injection will be made will be cleaned with an antiseptic soap. The surrounding skin will be covered with sterile drapes.   An anesthetic will be applied to your skin to make the injection area numb. You may feel a temporary stinging or burning sensation.   A video X-ray machine will be used to locate the joint. A contrast dye may be injected into the facet joint area to help with locating the joint.   When the joint is located, an anesthetic medicine will be injected into the joint through the needle.   Your health care provider will ask you whether you feel pain relief. If you do feel relief, a steroid may be injected to provide pain  relief for a longer period of time. If you do not feel relief or feel only partial relief, additional injections of an anesthetic may be made in other facet joints.   The needle will be removed, the skin will be cleansed, and bandages will be applied.  AFTER THE PROCEDURE   You will be observed for 15-30 minutes before being allowed to go home. Do not drive. Have an adult drive you or take a taxi or public transportation instead.   If you feel pain relief, the pain will return in several hours or days when the anesthetic wears off.   You may feel pain relief 2-14 days after the procedure. The amount of time this relief lasts varies from person to person.   It is normal to feel some tenderness over the injected area(s) for 2 days following the procedure.   If you have diabetes, you may have a temporary increase in blood sugar. Document Released: 04/24/2007  Document Revised: 04/19/2014 Document Reviewed: 09/22/2012 [facility] Patient Information 2015 [place], [place]. This information is not intended to replace advice given to you by your health care provider. Make sure you discuss any questions you have with your health care provider.

## 2015-07-12 ENCOUNTER — Telehealth: Payer: Self-pay | Admitting: *Deleted

## 2015-07-12 NOTE — Telephone Encounter (Signed)
Left message

## 2015-07-20 ENCOUNTER — Other Ambulatory Visit: Payer: Self-pay | Admitting: Pain Medicine

## 2015-07-20 DIAGNOSIS — M47818 Spondylosis without myelopathy or radiculopathy, sacral and sacrococcygeal region: Secondary | ICD-10-CM

## 2015-07-20 DIAGNOSIS — N803 Endometriosis of pelvic peritoneum, unspecified: Secondary | ICD-10-CM

## 2015-07-20 DIAGNOSIS — M47816 Spondylosis without myelopathy or radiculopathy, lumbar region: Secondary | ICD-10-CM

## 2015-07-20 DIAGNOSIS — M461 Sacroiliitis, not elsewhere classified: Secondary | ICD-10-CM

## 2015-07-20 DIAGNOSIS — M5136 Other intervertebral disc degeneration, lumbar region: Secondary | ICD-10-CM

## 2015-07-20 DIAGNOSIS — M533 Sacrococcygeal disorders, not elsewhere classified: Secondary | ICD-10-CM

## 2015-07-26 ENCOUNTER — Ambulatory Visit: Payer: Medicare Other | Attending: Pain Medicine | Admitting: Pain Medicine

## 2015-07-26 ENCOUNTER — Encounter: Payer: Self-pay | Admitting: Pain Medicine

## 2015-07-26 VITALS — BP 102/62 | HR 62 | Temp 98.2°F | Resp 16 | Ht 64.0 in | Wt 255.0 lb

## 2015-07-26 DIAGNOSIS — M79605 Pain in left leg: Secondary | ICD-10-CM | POA: Diagnosis present

## 2015-07-26 DIAGNOSIS — N803 Endometriosis of pelvic peritoneum, unspecified: Secondary | ICD-10-CM

## 2015-07-26 DIAGNOSIS — N809 Endometriosis, unspecified: Secondary | ICD-10-CM | POA: Insufficient documentation

## 2015-07-26 DIAGNOSIS — M5136 Other intervertebral disc degeneration, lumbar region: Secondary | ICD-10-CM | POA: Diagnosis not present

## 2015-07-26 DIAGNOSIS — M47816 Spondylosis without myelopathy or radiculopathy, lumbar region: Secondary | ICD-10-CM

## 2015-07-26 DIAGNOSIS — M47818 Spondylosis without myelopathy or radiculopathy, sacral and sacrococcygeal region: Secondary | ICD-10-CM

## 2015-07-26 DIAGNOSIS — M545 Low back pain: Secondary | ICD-10-CM | POA: Diagnosis present

## 2015-07-26 DIAGNOSIS — M25559 Pain in unspecified hip: Secondary | ICD-10-CM

## 2015-07-26 DIAGNOSIS — M533 Sacrococcygeal disorders, not elsewhere classified: Secondary | ICD-10-CM | POA: Diagnosis not present

## 2015-07-26 DIAGNOSIS — M79604 Pain in right leg: Secondary | ICD-10-CM | POA: Diagnosis present

## 2015-07-26 DIAGNOSIS — M461 Sacroiliitis, not elsewhere classified: Secondary | ICD-10-CM

## 2015-07-26 MED ORDER — HYDROCODONE-ACETAMINOPHEN 7.5-325 MG/15ML PO SOLN: ORAL | Status: DC

## 2015-07-26 NOTE — Patient Instructions (Addendum)
Continue present medication Flexeril Neurontin and hydrocodone acetaminophen    Lumbar facet, medial branch nerve, blocks to be performed Wednesday, 08/02/2014 if approved by your insurance company. Call our office to see if you have been approved  F/U PCP Dr. Gavin Potters for evaliation of  BP and general medical  Condition  F/U Dr.Zolnoun   F/U surgical evaluation  F/U neurological evaluation  May consider radiofrequency rhizolysis or intraspinal procedures pending response to present treatment and F/U evaluation   Patient to call Pain Management Center should patient have concerns prior to scheduled return appointmen. GENERAL RISKS AND COMPLICATIONS  What are the risk, side effects and possible complications? Generally speaking, most procedures are safe.  However, with any procedure there are risks, side effects, and the possibility of complications.  The risks and complications are dependent upon the sites that are lesioned, or the type of nerve block to be performed.  The closer the procedure is to the spine, the more serious the risks are.  Great care is taken when placing the radio frequency needles, block needles or lesioning probes, but sometimes complications can occur. 1. Infection: Any time there is an injection through the skin, there is a risk of infection.  This is why sterile conditions are used for these blocks.  There are four possible types of infection. 1. Localized skin infection. 2. Central Nervous System Infection-This can be in the form of Meningitis, which can be deadly. 3. Epidural Infections-This can be in the form of an epidural abscess, which can cause pressure inside of the spine, causing compression of the spinal cord with subsequent paralysis. This would require an emergency surgery to decompress, and there are no guarantees that the patient would recover from the paralysis. 4. Discitis-This is an infection of the intervertebral discs.  It occurs in about 1% of  discography procedures.  It is difficult to treat and it may lead to surgery.        2. Pain: the needles have to go through skin and soft tissues, will cause soreness.       3. Damage to internal structures:  The nerves to be lesioned may be near blood vessels or    other nerves which can be potentially damaged.       4. Bleeding: Bleeding is more common if the patient is taking blood thinners such as  aspirin, Coumadin, Ticiid, Plavix, etc., or if he/she have some genetic predisposition  such as hemophilia. Bleeding into the spinal canal can cause compression of the spinal  cord with subsequent paralysis.  This would require an emergency surgery to  decompress and there are no guarantees that the patient would recover from the  paralysis.       5. Pneumothorax:  Puncturing of a lung is a possibility, every time a needle is introduced in  the area of the chest or upper back.  Pneumothorax refers to free air around the  collapsed lung(s), inside of the thoracic cavity (chest cavity).  Another two possible  complications related to a similar event would include: Hemothorax and Chylothorax.   These are variations of the Pneumothorax, where instead of air around the collapsed  lung(s), you may have blood or chyle, respectively.       6. Spinal headaches: They may occur with any procedures in the area of the spine.       7. Persistent CSF (Cerebro-Spinal Fluid) leakage: This is a rare problem, but may occur  with prolonged intrathecal or epidural catheters either  due to the formation of a fistulous  track or a dural tear.       8. Nerve damage: By working so close to the spinal cord, there is always a possibility of  nerve damage, which could be as serious as a permanent spinal cord injury with  paralysis.       9. Death:  Although rare, severe deadly allergic reactions known as "Anaphylactic  reaction" can occur to any of the medications used.      10. Worsening of the symptoms:  We can always make thing  worse.  What are the chances of something like this happening? Chances of any of this occuring are extremely low.  By statistics, you have more of a chance of getting killed in a motor vehicle accident: while driving to the hospital than any of the above occurring .  Nevertheless, you should be aware that they are possibilities.  In general, it is similar to taking a shower.  Everybody knows that you can slip, hit your head and get killed.  Does that mean that you should not shower again?  Nevertheless always keep in mind that statistics do not mean anything if you happen to be on the wrong side of them.  Even if a procedure has a 1 (one) in a 1,000,000 (million) chance of going wrong, it you happen to be that one..Also, keep in mind that by statistics, you have more of a chance of having something go wrong when taking medications.  Who should not have this procedure? If you are on a blood thinning medication (e.g. Coumadin, Plavix, see list of "Blood Thinners"), or if you have an active infection going on, you should not have the procedure.  If you are taking any blood thinners, please inform your physician.  How should I prepare for this procedure?  Do not eat or drink anything at least six hours prior to the procedure.  Bring a driver with you .  It cannot be a taxi.  Come accompanied by an adult that can drive you back, and that is strong enough to help you if your legs get weak or numb from the local anesthetic.  Take all of your medicines the morning of the procedure with just enough water to swallow them.  If you have diabetes, make sure that you are scheduled to have your procedure done first thing in the morning, whenever possible.  If you have diabetes, take only half of your insulin dose and notify our nurse that you have done so as soon as you arrive at the clinic.  If you are diabetic, but only take blood sugar pills (oral hypoglycemic), then do not take them on the morning of your  procedure.  You may take them after you have had the procedure.  Do not take aspirin or any aspirin-containing medications, at least eleven (11) days prior to the procedure.  They may prolong bleeding.  Wear loose fitting clothing that may be easy to take off and that you would not mind if it got stained with Betadine or blood.  Do not wear any jewelry or perfume  Remove any nail coloring.  It will interfere with some of our monitoring equipment.  NOTE: Remember that this is not meant to be interpreted as a complete list of all possible complications.  Unforeseen problems may occur.  BLOOD THINNERS The following drugs contain aspirin or other products, which can cause increased bleeding during surgery and should not be taken for 2  weeks prior to and 1 week after surgery.  If you should need take something for relief of minor pain, you may take acetaminophen which is found in Tylenol,m Datril, Anacin-3 and Panadol. It is not blood thinner. The products listed below are.  Do not take any of the products listed below in addition to any listed on your instruction sheet.  A.P.C or A.P.C with Codeine Codeine Phosphate Capsules #3 Ibuprofen Ridaura  ABC compound Congesprin Imuran rimadil  Advil Cope Indocin Robaxisal  Alka-Seltzer Effervescent Pain Reliever and Antacid Coricidin or Coricidin-D  Indomethacin Rufen  Alka-Seltzer plus Cold Medicine Cosprin Ketoprofen S-A-C Tablets  Anacin Analgesic Tablets or Capsules Coumadin Korlgesic Salflex  Anacin Extra Strength Analgesic tablets or capsules CP-2 Tablets Lanoril Salicylate  Anaprox Cuprimine Capsules Levenox Salocol  Anexsia-D Dalteparin Magan Salsalate  Anodynos Darvon compound Magnesium Salicylate Sine-off  Ansaid Dasin Capsules Magsal Sodium Salicylate  Anturane Depen Capsules Marnal Soma  APF Arthritis pain formula Dewitt's Pills Measurin Stanback  Argesic Dia-Gesic Meclofenamic Sulfinpyrazone  Arthritis Bayer Timed Release Aspirin  Diclofenac Meclomen Sulindac  Arthritis pain formula Anacin Dicumarol Medipren Supac  Analgesic (Safety coated) Arthralgen Diffunasal Mefanamic Suprofen  Arthritis Strength Bufferin Dihydrocodeine Mepro Compound Suprol  Arthropan liquid Dopirydamole Methcarbomol with Aspirin Synalgos  ASA tablets/Enseals Disalcid Micrainin Tagament  Ascriptin Doan's Midol Talwin  Ascriptin A/D Dolene Mobidin Tanderil  Ascriptin Extra Strength Dolobid Moblgesic Ticlid  Ascriptin with Codeine Doloprin or Doloprin with Codeine Momentum Tolectin  Asperbuf Duoprin Mono-gesic Trendar  Aspergum Duradyne Motrin or Motrin IB Triminicin  Aspirin plain, buffered or enteric coated Durasal Myochrisine Trigesic  Aspirin Suppositories Easprin Nalfon Trillsate  Aspirin with Codeine Ecotrin Regular or Extra Strength Naprosyn Uracel  Atromid-S Efficin Naproxen Ursinus  Auranofin Capsules Elmiron Neocylate Vanquish  Axotal Emagrin Norgesic Verin  Azathioprine Empirin or Empirin with Codeine Normiflo Vitamin E  Azolid Emprazil Nuprin Voltaren  Bayer Aspirin plain, buffered or children's or timed BC Tablets or powders Encaprin Orgaran Warfarin Sodium  Buff-a-Comp Enoxaparin Orudis Zorpin  Buff-a-Comp with Codeine Equegesic Os-Cal-Gesic   Buffaprin Excedrin plain, buffered or Extra Strength Oxalid   Bufferin Arthritis Strength Feldene Oxphenbutazone   Bufferin plain or Extra Strength Feldene Capsules Oxycodone with Aspirin   Bufferin with Codeine Fenoprofen Fenoprofen Pabalate or Pabalate-SF   Buffets II Flogesic Panagesic   Buffinol plain or Extra Strength Florinal or Florinal with Codeine Panwarfarin   Buf-Tabs Flurbiprofen Penicillamine   Butalbital Compound Four-way cold tablets Penicillin   Butazolidin Fragmin Pepto-Bismol   Carbenicillin Geminisyn Percodan   Carna Arthritis Reliever Geopen Persantine   Carprofen Gold's salt Persistin   Chloramphenicol Goody's Phenylbutazone   Chloromycetin Haltrain Piroxlcam    Clmetidine heparin Plaquenil   Cllnoril Hyco-pap Ponstel   Clofibrate Hydroxy chloroquine Propoxyphen         Before stopping any of these medications, be sure to consult the physician who ordered them.  Some, such as Coumadin (Warfarin) are ordered to prevent or treat serious conditions such as "deep thrombosis", "pumonary embolisms", and other heart problems.  The amount of time that you may need off of the medication may also vary with the medication and the reason for which you were taking it.  If you are taking any of these medications, please make sure you notify your pain physician before you undergo any procedures.         Facet Joint Block The facet joints connect the bones of the spine (vertebrae). They make it possible for you to bend, twist, and make other  movements with your spine. They also prevent you from overbending, overtwisting, and making other excessive movements.  A facet joint block is a procedure where a numbing medicine (anesthetic) is injected into a facet joint. Often, a type of anti-inflammatory medicine called a steroid is also injected. A facet joint block may be done for two reasons:  2. Diagnosis. A facet joint block may be done as a test to see whether neck or back pain is caused by a worn-down or infected facet joint. If the pain gets better after a facet joint block, it means the pain is probably coming from the facet joint. If the pain does not get better, it means the pain is probably not coming from the facet joint.  3. Therapy. A facet joint block may be done to relieve neck or back pain caused by a facet joint. A facet joint block is only done as a therapy if the pain does not improve with medicine, exercise programs, physical therapy, and other forms of pain management. LET [facility] CARE PROVIDER KNOW ABOUT:   Any allergies you have.   All medicines you are taking, including vitamins, herbs, eyedrops, and over-the-counter medicines and creams.    Previous problems you or members of your family have had with the use of anesthetics.   Any blood disorders you have had.   Other health problems you have. RISKS AND COMPLICATIONS Generally, having a facet joint block is safe. However, as with any procedure, complications can occur. Possible complications associated with having a facet joint block include:   Bleeding.   Injury to a nerve near the injection site.   Pain at the injection site.   Weakness or numbness in areas controlled by nerves near the injection site.   Infection.   Temporary fluid retention.   Allergic reaction to anesthetics or medicines used during the procedure. BEFORE THE PROCEDURE   Follow your health care provider's instructions if you are taking dietary supplements or medicines. You may need to stop taking them or reduce your dosage.   Do not take any new dietary supplements or medicines without asking your health care provider first.   Follow your health care provider's instructions about eating and drinking before the procedure. You may need to stop eating and drinking several hours before the procedure.   Arrange to have an adult drive you home after the procedure. PROCEDURE 12. You may need to remove your clothing and dress in an open-back gown so that your health care provider can access your spine.  13. The procedure will be done while you are lying on an X-ray table. Most of the time you will be asked to lie on your stomach, but you may be asked to lie in a different position if an injection will be made in your neck.  14. Special machines will be used to monitor your oxygen levels, heart rate, and blood pressure.  15. If an injection will be made in your neck, an intravenous (IV) tube will be inserted into one of your veins. Fluids and medicine will flow directly into your body through the IV tube.  16. The area over the facet joint where the injection will be made will be  cleaned with an antiseptic soap. The surrounding skin will be covered with sterile drapes.  17. An anesthetic will be applied to your skin to make the injection area numb. You may feel a temporary stinging or burning sensation.  18. A video X-ray machine will be used  to locate the joint. A contrast dye may be injected into the facet joint area to help with locating the joint.  19. When the joint is located, an anesthetic medicine will be injected into the joint through the needle.  20. Your health care provider will ask you whether you feel pain relief. If you do feel relief, a steroid may be injected to provide pain relief for a longer period of time. If you do not feel relief or feel only partial relief, additional injections of an anesthetic may be made in other facet joints.  21. The needle will be removed, the skin will be cleansed, and bandages will be applied.  AFTER THE PROCEDURE   You will be observed for 15-30 minutes before being allowed to go home. Do not drive. Have an adult drive you or take a taxi or public transportation instead.   If you feel pain relief, the pain will return in several hours or days when the anesthetic wears off.   You may feel pain relief 2-14 days after the procedure. The amount of time this relief lasts varies from person to person.   It is normal to feel some tenderness over the injected area(s) for 2 days following the procedure.   If you have diabetes, you may have a temporary increase in blood sugar. Document Released: 04/24/2007 Document Revised: 04/19/2014 Document Reviewed: 09/22/2012 [facility] Patient Information 2015 [place], [place]. This information is not intended to replace advice given to you by your health care provider. Make sure you discuss any questions you have with your health care provider.

## 2015-07-26 NOTE — Progress Notes (Signed)
   Subjective:    Patient ID: [name], female    DOB: 06/29/1976, 39 y.o.   MRN: [id number]  HPI  Patient is 39 year old female [facility] for further evaluation and treatment of pain involving the region of the lower back lower extremity region and pelvic region. Patient has undergone evaluation and rested [place] by DrOrion Crook and has return appointment for her pelvic evaluation in September. Patient has significant lower back lower extremity pain aggravated by standing walking twisting turning maneuvers as well as climbing stairs patient denies any trauma change in events of daily living the call significant change in symptomatology we'll discuss patient's condition is concern regarding component of pain due to sacroiliac joint dysfunction in addition to lumbar facet syndrome. Patient has had most significantly following lumbar facet, medial branch nerve blocks. We will attempt to have patient approved for radiofrequency rhizolysis of the lumbar facet, medial branch nerves, and we'll proceed with lumbar facet, medial branch nerve blocks at time return appointment as discussed with patient. The patient was with understanding and in agreement with suggested treatment plan   Review of Systems     Objective:   Physical Exam  There was tenderness to palpation of the splenius capitis and occipitalis musculature regions palpation of which reproduced pain of mild degree. There was mild tenderness of the acromioclavicular glenohumeral joint region. Patient appeared to be with bilaterally equal grip strength. Tinel and Phalen's maneuver were without increase of pain of significant degree. There was minimal tends to palpation over the thoracic facet thoracic paraspinal musculature region. Palpation over the lower thoracic paraspinal musculature region lumbar facet region was with moderate discomfort. There was tenderness over the lumbar paraspinal musculature region lumbar  facet region of severe degree and reproduced moderate to moderately severe discomfort. Palpation over the PSIS and PII S region reproduced moderate discomfort. Straight leg raising was tolerated to approximately 20 without increased pain with dorsiflexion noted. There was negative clonus negative Homans. There was mild tinnitus of the greater trochanteric region iliotibial band region. There was negative clonus negative Homans. Abdomen was nontender with no costovertebral angle tenderness noted.       Assessment & Plan:   Degenerative disc disease lumbar spine  Lumbar facet syndrome  Sacroiliac joint dysfunction  Endometriosis    Plan   Continue present medication Neurontin and hydrocodone acetaminophen  Lumbosacral selective nerve root block to be performed at time return appointment  F/U PCP Dr. Gavin Potters for evaliation of  BP and general medical  condition  F/U surgical evaluation  F/U neurological evaluation  F/U Dr  Orion Crook as planned  May consider radiofrequency rhizolysis or intraspinal procedures pending response to present treatment and F/U evaluation   Patient to call [facility] should patient have concerns prior to scheduled return appointmen.

## 2015-07-26 NOTE — Progress Notes (Signed)
Discharged to home ambulatory with script in hand for Hycet x 2.  Pre procedure instructions given with teach back 3 done.

## 2015-07-26 NOTE — Progress Notes (Signed)
Safety precautions to be maintained throughout the outpatient stay will include: orient to surroundings, keep bed in low position, maintain call bell within reach at all times, provide assistance with transfer out of bed and ambulation.  

## 2015-08-03 ENCOUNTER — Encounter: Payer: Self-pay | Admitting: Pain Medicine

## 2015-08-03 ENCOUNTER — Ambulatory Visit: Payer: Medicare Other | Attending: Pain Medicine | Admitting: Pain Medicine

## 2015-08-03 VITALS — BP 94/51 | HR 59 | Temp 97.7°F | Resp 18 | Ht 64.0 in | Wt 250.0 lb

## 2015-08-03 DIAGNOSIS — M461 Sacroiliitis, not elsewhere classified: Secondary | ICD-10-CM

## 2015-08-03 DIAGNOSIS — M79605 Pain in left leg: Secondary | ICD-10-CM | POA: Diagnosis present

## 2015-08-03 DIAGNOSIS — M5136 Other intervertebral disc degeneration, lumbar region: Secondary | ICD-10-CM | POA: Diagnosis not present

## 2015-08-03 DIAGNOSIS — N803 Endometriosis of pelvic peritoneum, unspecified: Secondary | ICD-10-CM

## 2015-08-03 DIAGNOSIS — M47816 Spondylosis without myelopathy or radiculopathy, lumbar region: Secondary | ICD-10-CM

## 2015-08-03 DIAGNOSIS — M25559 Pain in unspecified hip: Secondary | ICD-10-CM

## 2015-08-03 DIAGNOSIS — M533 Sacrococcygeal disorders, not elsewhere classified: Secondary | ICD-10-CM

## 2015-08-03 DIAGNOSIS — M47818 Spondylosis without myelopathy or radiculopathy, sacral and sacrococcygeal region: Secondary | ICD-10-CM

## 2015-08-03 DIAGNOSIS — M79604 Pain in right leg: Secondary | ICD-10-CM | POA: Diagnosis present

## 2015-08-03 DIAGNOSIS — M545 Low back pain: Secondary | ICD-10-CM | POA: Diagnosis present

## 2015-08-03 MED ORDER — MIDAZOLAM HCL 5 MG/5ML IJ SOLN
INTRAMUSCULAR | Status: AC
  Administered 2015-08-03: 5 mg via INTRAVENOUS
  Filled 2015-08-03: qty 5

## 2015-08-03 MED ORDER — TRIAMCINOLONE ACETONIDE 40 MG/ML IJ SUSP
INTRAMUSCULAR | Status: AC
  Administered 2015-08-03: 08:00:00
  Filled 2015-08-03: qty 1

## 2015-08-03 MED ORDER — ORPHENADRINE CITRATE 30 MG/ML IJ SOLN
INTRAMUSCULAR | Status: AC
  Administered 2015-08-03: 08:00:00
  Filled 2015-08-03: qty 2

## 2015-08-03 MED ORDER — BUPIVACAINE HCL (PF) 0.25 % IJ SOLN
INTRAMUSCULAR | Status: AC
  Administered 2015-08-03: 08:00:00
  Filled 2015-08-03: qty 30

## 2015-08-03 MED ORDER — BUPIVACAINE HCL (PF) 0.5 % IJ SOLN
INTRAMUSCULAR | Status: AC
  Administered 2015-08-03: 09:00:00
  Filled 2015-08-03: qty 30

## 2015-08-03 MED ORDER — FENTANYL CITRATE (PF) 100 MCG/2ML IJ SOLN
INTRAMUSCULAR | Status: AC
  Administered 2015-08-03: 100 ug via INTRAVENOUS
  Filled 2015-08-03: qty 2

## 2015-08-03 NOTE — Progress Notes (Signed)
Subjective:    Patient ID: [name], female    DOB: 1976-01-28, 39 y.o.   MRN: [id number]  HPI  PROCEDURE PERFORMED: Lumbar facet (medial branch block)   NOTE: The patient is a 39 y.o. female who returns to [facility] for further evaluation and treatment of pain involving the lumbar and lower extremity region. MRI  revealed the patient to be with evidence of degenerative disc disease lumbar spine. There is concern regarding a significant component of patient's pain being due to facet syndrome The risks, benefits, and expectations of the procedure have been discussed and explained to the patient who was understanding and in agreement with suggested treatment plan. We will proceed with interventional treatment as discussed and as explained to the patient who was understanding and wished to proceed with procedure as planned.   DESCRIPTION OF PROCEDURE: Lumbar facet (medial branch block) with IV Versed, IV fentanyl conscious sedation, EKG, blood pressure, pulse, and pulse oximetry monitoring. The procedure was performed with the patient in the prone position. Betadine prep of proposed entry site performed.   NEEDLE PLACEMENT AT: Left L 2 lumbar facet (medial branch block). Under fluoroscopic guidance with oblique orientation of 15 degrees, a 22-gauge needle was inserted at the L 2 vertebral body level with needle placed at the targeted area of Burton's Eye or Eye of the Scotty Dog with documentation of needle placement in the superior and lateral border of targeted area of Burton's Eye or Eye of the Scotty Dog with oblique orientation of 15 degrees. Following documentation of needle placement at the L 2 vertebral body level, needle placement was then accomplished at the L 3 vertebral body level.   NEEDLE PLACEMENT AT L3, L4, and L5 VERTEBRAL BODY LEVELS ON THE LEFT SIDE The procedure was performed at the L3, L4, and L5 vertebral body levels exactly as was performed at the L 2  vertebral body level utilizing the same technique and under fluoroscopic guidance.  NEEDLE PLACEMENT AT THE SACRAL ALA with AP view of the lumbosacral spine. With the patient in the prone position, Betadine prep of proposed entry site accomplished, a 22 gauge needle was inserted in the region of the sacral ala (groove formed by the superior articulating process of S1 and the sacral wing).   Needle placement was then verified at all levels on lateral view. Following documentation of needle placement at all levels on lateral view and following negative aspiration for heme and CSF, each level was injected with 1 mL of 0.25% bupivacaine with Kenalog.     LUMBAR FACET, MEDIAL BRANCH NERVE, BLOCKS PERFORMED ON THE RIGHT SIDE   The procedure was performed on the right side exactly as was performed on the left side at the same levels and utilizing the same technique under fluoroscopic guidance.     The patient tolerated the procedure well. A total of 40 mg of Kenalog was utilized for the procedure.   PLAN:  1. Medications: The patient will continue presently prescribed medications Neurontin and hydrocodone acetaminophen. 2. May consider modification of treatment regimen at time of return appointment pending response to treatment rendered on today's visit. 3. The patient is to follow-up with primary care physician Dr. Gavin Potters for further evaluation of blood pressure and general medical condition status post steroid injection performed on today's visit. 4. Surgical follow-up evaluation and follow-up with Dr.Zolnoun as planned  5. Neurological follow-up evaluation. 6. The patient may be candidate for radiofrequency procedures pending response to treatment and follow-up  evaluation. 7. The patient has been advised to call the [facility] prior to scheduled return appointment should there be significant change in condition or should patient have other concerns regarding condition prior to  scheduled return appointment.  The patient is understanding and in agreement with suggested treatment plan.     Review of Systems     Objective:   Physical Exam        Assessment & Plan:

## 2015-08-03 NOTE — Patient Instructions (Addendum)
Continue present medications Neurontin and hydrocodone acetaminophen   F/U PCP Dr. Gavin Potters for evaliation of  BP and general medical  condition  F/U surgical evaluation  F/U neurological evaluation  F/U Dr. Orion Crook as planned  May consider radiofrequency rhizolysis or intraspinal procedures pending response to present treatment and F/U evaluation   Patient to call [facility] should patient have concerns prior to scheduled return appointmen. Pain Management Discharge Instructions  General Discharge Instructions :  If you need to reach your doctor call: Monday-Friday 8:00 am - 4:00 pm at [phone] or toll free [phone].  After clinic hours [phone] to have operator reach doctor.  Bring all of your medication bottles to all your appointments in the pain clinic.  To cancel or reschedule your appointment with Pain Management please remember to call 24 hours in advance to avoid a fee.  Refer to the educational materials which you have been given on: General Risks, I had my Procedure. Discharge Instructions, Post Sedation.  Post Procedure Instructions:  The drugs you were given will stay in your system until tomorrow, so for the next 24 hours you should not drive, make any legal decisions or drink any alcoholic beverages.  You may eat anything you prefer, but it is better to start with liquids then soups and crackers, and gradually work up to solid foods.  Please notify your doctor immediately if you have any unusual bleeding, trouble breathing or pain that is not related to your normal pain.  Depending on the type of procedure that was done, some parts of your body may feel week and/or numb.  This usually clears up by tonight or the next day.  Walk with the use of an assistive device or accompanied by an adult for the 24 hours.  You may use ice on the affected area for the first 24 hours.  Put ice in a Ziploc bag and cover with a towel and place against area 15  minutes on 15 minutes off.  You may switch to heat after 24 hours.GENERAL RISKS AND COMPLICATIONS  What are the risk, side effects and possible complications? Generally speaking, most procedures are safe.  However, with any procedure there are risks, side effects, and the possibility of complications.  The risks and complications are dependent upon the sites that are lesioned, or the type of nerve block to be performed.  The closer the procedure is to the spine, the more serious the risks are.  Great care is taken when placing the radio frequency needles, block needles or lesioning probes, but sometimes complications can occur. 1. Infection: Any time there is an injection through the skin, there is a risk of infection.  This is why sterile conditions are used for these blocks.  There are four possible types of infection. 1. Localized skin infection. 2. Central Nervous System Infection-This can be in the form of Meningitis, which can be deadly. 3. Epidural Infections-This can be in the form of an epidural abscess, which can cause pressure inside of the spine, causing compression of the spinal cord with subsequent paralysis. This would require an emergency surgery to decompress, and there are no guarantees that the patient would recover from the paralysis. 4. Discitis-This is an infection of the intervertebral discs.  It occurs in about 1% of discography procedures.  It is difficult to treat and it may lead to surgery.        2. Pain: the needles have to go through skin and soft tissues, will cause soreness.  3. Damage to internal structures:  The nerves to be lesioned may be near blood vessels or    other nerves which can be potentially damaged.       4. Bleeding: Bleeding is more common if the patient is taking blood thinners such as  aspirin, Coumadin, Ticiid, Plavix, etc., or if he/she have some genetic predisposition  such as hemophilia. Bleeding into the spinal canal can cause compression of  the spinal  cord with subsequent paralysis.  This would require an emergency surgery to  decompress and there are no guarantees that the patient would recover from the  paralysis.       5. Pneumothorax:  Puncturing of a lung is a possibility, every time a needle is introduced in  the area of the chest or upper back.  Pneumothorax refers to free air around the  collapsed lung(s), inside of the thoracic cavity (chest cavity).  Another two possible  complications related to a similar event would include: Hemothorax and Chylothorax.   These are variations of the Pneumothorax, where instead of air around the collapsed  lung(s), you may have blood or chyle, respectively.       6. Spinal headaches: They may occur with any procedures in the area of the spine.       7. Persistent CSF (Cerebro-Spinal Fluid) leakage: This is a rare problem, but may occur  with prolonged intrathecal or epidural catheters either due to the formation of a fistulous  track or a dural tear.       8. Nerve damage: By working so close to the spinal cord, there is always a possibility of  nerve damage, which could be as serious as a permanent spinal cord injury with  paralysis.       9. Death:  Although rare, severe deadly allergic reactions known as "Anaphylactic  reaction" can occur to any of the medications used.      10. Worsening of the symptoms:  We can always make thing worse.  What are the chances of something like this happening? Chances of any of this occuring are extremely low.  By statistics, you have more of a chance of getting killed in a motor vehicle accident: while driving to the hospital than any of the above occurring .  Nevertheless, you should be aware that they are possibilities.  In general, it is similar to taking a shower.  Everybody knows that you can slip, hit your head and get killed.  Does that mean that you should not shower again?  Nevertheless always keep in mind that statistics do not mean anything if you  happen to be on the wrong side of them.  Even if a procedure has a 1 (one) in a 1,000,000 (million) chance of going wrong, it you happen to be that one..Also, keep in mind that by statistics, you have more of a chance of having something go wrong when taking medications.  Who should not have this procedure? If you are on a blood thinning medication (e.g. Coumadin, Plavix, see list of "Blood Thinners"), or if you have an active infection going on, you should not have the procedure.  If you are taking any blood thinners, please inform your physician.  How should I prepare for this procedure?  Do not eat or drink anything at least six hours prior to the procedure.  Bring a driver with you .  It cannot be a taxi.  Come accompanied by an adult that can drive you back, and  that is strong enough to help you if your legs get weak or numb from the local anesthetic.  Take all of your medicines the morning of the procedure with just enough water to swallow them.  If you have diabetes, make sure that you are scheduled to have your procedure done first thing in the morning, whenever possible.  If you have diabetes, take only half of your insulin dose and notify our nurse that you have done so as soon as you arrive at the clinic.  If you are diabetic, but only take blood sugar pills (oral hypoglycemic), then do not take them on the morning of your procedure.  You may take them after you have had the procedure.  Do not take aspirin or any aspirin-containing medications, at least eleven (11) days prior to the procedure.  They may prolong bleeding.  Wear loose fitting clothing that may be easy to take off and that you would not mind if it got stained with Betadine or blood.  Do not wear any jewelry or perfume  Remove any nail coloring.  It will interfere with some of our monitoring equipment.  NOTE: Remember that this is not meant to be interpreted as a complete list of all possible complications.   Unforeseen problems may occur.  BLOOD THINNERS The following drugs contain aspirin or other products, which can cause increased bleeding during surgery and should not be taken for 2 weeks prior to and 1 week after surgery.  If you should need take something for relief of minor pain, you may take acetaminophen which is found in Tylenol,m Datril, Anacin-3 and Panadol. It is not blood thinner. The products listed below are.  Do not take any of the products listed below in addition to any listed on your instruction sheet.  A.P.C or A.P.C with Codeine Codeine Phosphate Capsules #3 Ibuprofen Ridaura  ABC compound Congesprin Imuran rimadil  Advil Cope Indocin Robaxisal  Alka-Seltzer Effervescent Pain Reliever and Antacid Coricidin or Coricidin-D  Indomethacin Rufen  Alka-Seltzer plus Cold Medicine Cosprin Ketoprofen S-A-C Tablets  Anacin Analgesic Tablets or Capsules Coumadin Korlgesic Salflex  Anacin Extra Strength Analgesic tablets or capsules CP-2 Tablets Lanoril Salicylate  Anaprox Cuprimine Capsules Levenox Salocol  Anexsia-D Dalteparin Magan Salsalate  Anodynos Darvon compound Magnesium Salicylate Sine-off  Ansaid Dasin Capsules Magsal Sodium Salicylate  Anturane Depen Capsules Marnal Soma  APF Arthritis pain formula Dewitt's Pills Measurin Stanback  Argesic Dia-Gesic Meclofenamic Sulfinpyrazone  Arthritis Bayer Timed Release Aspirin Diclofenac Meclomen Sulindac  Arthritis pain formula Anacin Dicumarol Medipren Supac  Analgesic (Safety coated) Arthralgen Diffunasal Mefanamic Suprofen  Arthritis Strength Bufferin Dihydrocodeine Mepro Compound Suprol  Arthropan liquid Dopirydamole Methcarbomol with Aspirin Synalgos  ASA tablets/Enseals Disalcid Micrainin Tagament  Ascriptin Doan's Midol Talwin  Ascriptin A/D Dolene Mobidin Tanderil  Ascriptin Extra Strength Dolobid Moblgesic Ticlid  Ascriptin with Codeine Doloprin or Doloprin with Codeine Momentum Tolectin  Asperbuf Duoprin Mono-gesic  Trendar  Aspergum Duradyne Motrin or Motrin IB Triminicin  Aspirin plain, buffered or enteric coated Durasal Myochrisine Trigesic  Aspirin Suppositories Easprin Nalfon Trillsate  Aspirin with Codeine Ecotrin Regular or Extra Strength Naprosyn Uracel  Atromid-S Efficin Naproxen Ursinus  Auranofin Capsules Elmiron Neocylate Vanquish  Axotal Emagrin Norgesic Verin  Azathioprine Empirin or Empirin with Codeine Normiflo Vitamin E  Azolid Emprazil Nuprin Voltaren  Bayer Aspirin plain, buffered or children's or timed BC Tablets or powders Encaprin Orgaran Warfarin Sodium  Buff-a-Comp Enoxaparin Orudis Zorpin  Buff-a-Comp with Codeine Equegesic Os-Cal-Gesic   Buffaprin Excedrin plain, buffered  or Extra Strength Oxalid   Bufferin Arthritis Strength Feldene Oxphenbutazone   Bufferin plain or Extra Strength Feldene Capsules Oxycodone with Aspirin   Bufferin with Codeine Fenoprofen Fenoprofen Pabalate or Pabalate-SF   Buffets II Flogesic Panagesic   Buffinol plain or Extra Strength Florinal or Florinal with Codeine Panwarfarin   Buf-Tabs Flurbiprofen Penicillamine   Butalbital Compound Four-way cold tablets Penicillin   Butazolidin Fragmin Pepto-Bismol   Carbenicillin Geminisyn Percodan   Carna Arthritis Reliever Geopen Persantine   Carprofen Gold's salt Persistin   Chloramphenicol Goody's Phenylbutazone   Chloromycetin Haltrain Piroxlcam   Clmetidine heparin Plaquenil   Cllnoril Hyco-pap Ponstel   Clofibrate Hydroxy chloroquine Propoxyphen         Before stopping any of these medications, be sure to consult the physician who ordered them.  Some, such as Coumadin (Warfarin) are ordered to prevent or treat serious conditions such as "deep thrombosis", "pumonary embolisms", and other heart problems.  The amount of time that you may need off of the medication may also vary with the medication and the reason for which you were taking it.  If you are taking any of these medications, please make sure  you notify your pain physician before you undergo any procedures.

## 2015-08-03 NOTE — Progress Notes (Signed)
Safety precautions to be maintained throughout the outpatient stay will include: orient to surroundings, keep bed in low position, maintain call bell within reach at all times, provide assistance with transfer out of bed and ambulation.  

## 2015-08-04 ENCOUNTER — Telehealth: Payer: Self-pay | Admitting: Pain Medicine

## 2015-08-04 NOTE — Telephone Encounter (Signed)
Hives over entire body after facet block yesterday.

## 2015-08-04 NOTE — Telephone Encounter (Signed)
Thank you :)

## 2015-08-04 NOTE — Telephone Encounter (Signed)
Nurses Have patient see Dr. Gavin Potters for evaluation of rash since no new medications were used that had not been previously used for her procedures

## 2015-08-04 NOTE — Telephone Encounter (Signed)
Left message with patient advising her to see pcp.

## 2015-08-04 NOTE — Telephone Encounter (Signed)
Pt has broke out hives this morning after having procedure yesterday.

## 2015-08-24 ENCOUNTER — Encounter: Payer: Self-pay | Admitting: Pain Medicine

## 2015-08-24 ENCOUNTER — Ambulatory Visit: Payer: Medicare Other | Attending: Pain Medicine | Admitting: Pain Medicine

## 2015-08-24 VITALS — BP 97/84 | HR 63 | Temp 97.7°F | Resp 15 | Ht 64.0 in | Wt 250.0 lb

## 2015-08-24 DIAGNOSIS — M461 Sacroiliitis, not elsewhere classified: Secondary | ICD-10-CM

## 2015-08-24 DIAGNOSIS — M47818 Spondylosis without myelopathy or radiculopathy, sacral and sacrococcygeal region: Secondary | ICD-10-CM

## 2015-08-24 DIAGNOSIS — M5136 Other intervertebral disc degeneration, lumbar region: Secondary | ICD-10-CM | POA: Insufficient documentation

## 2015-08-24 DIAGNOSIS — M79605 Pain in left leg: Secondary | ICD-10-CM | POA: Diagnosis present

## 2015-08-24 DIAGNOSIS — N809 Endometriosis, unspecified: Secondary | ICD-10-CM | POA: Diagnosis not present

## 2015-08-24 DIAGNOSIS — N803 Endometriosis of pelvic peritoneum, unspecified: Secondary | ICD-10-CM

## 2015-08-24 DIAGNOSIS — M47816 Spondylosis without myelopathy or radiculopathy, lumbar region: Secondary | ICD-10-CM

## 2015-08-24 DIAGNOSIS — M533 Sacrococcygeal disorders, not elsewhere classified: Secondary | ICD-10-CM

## 2015-08-24 DIAGNOSIS — M25559 Pain in unspecified hip: Secondary | ICD-10-CM

## 2015-08-24 DIAGNOSIS — M545 Low back pain: Secondary | ICD-10-CM | POA: Diagnosis present

## 2015-08-24 DIAGNOSIS — M79604 Pain in right leg: Secondary | ICD-10-CM | POA: Diagnosis present

## 2015-08-24 MED ORDER — HYDROCODONE-ACETAMINOPHEN 7.5-325 MG/15ML PO SOLN: ORAL | Status: DC

## 2015-08-24 NOTE — Progress Notes (Signed)
   Subjective:    Patient ID: [name], female    DOB: 1976-10-11, 39 y.o.   MRN: [id number]  HPI  Patient is 39 year old female returns to pain management for further evaluation and treatment of pain involving the lower back lower extremity region. Patient states that her pain improved significantly following lumbar facet, medial branch nerve blocks. Patient was with more than 50% relief of pain following the procedure. Patient states the pain is aggravated by standing walking twisting turning maneuvers. Patient denies recent trauma change in events of daily living the call significant changes of the pathology. Patient will follow-up Dr.Zolnoun for evaluation of pelvic pain as planned. We will proceed with interventional treatment for lower back lower extremity pain consisting of radiofrequency rhizolysis lumbar facet, medial branch nerves, pending insurance approval and will continue hydrocodone acetaminophen. The patient was in agreement status treatment plan. We will also request insurance approval for patient to undergo radiofrequency rhizolysis lumbar facets medial branch nerves at this time. The patient was in agreement with suggested treatment plan.     Review of Systems     Objective:   Physical Exam  There was tennis to palpations please And occipitalis muscles regions. There was mild tends of the cervical facet cervical paraspinal muscle treatment with mild tinnitus of the acromioclavicular glenohumeral joint region. Patient appeared to be with bilaterally equal grip strength. Tinel and Phalen's maneuver without increased pain of significant degree. There was tenderness to palpation over the lower thoracic paraspinal musculature region of moderate degree. Lateral bending and rotation and extension to palpation the lumbar facets reproduce moderate discomfort. Straight leg raising was tolerates approximately 30 without increased pain with dorsiflexion noted. There was negative clonus  negative Homans. There was no definite sensory deficit of dermatomal distribution detected. Palpation of the PSIS and PII S region reproduced moderate discomfort. Palpation of the gluteal and piriformis musculature region reproduced mild discomfort. Lateral bending and rotation and extension and palpation of the lumbar facets reproduce the lumbar portion patient's pain. There was negative clonus negative Homans. Abdomen nontender and no costovertebral tenderness noted.Degenerative disc disease lumbar spine  Lumbar facet syndrome  Sacroiliac joint dysfunction  Endometriosis    PLAN   Continue present medication Neurontin and hydrocodone acetaminophen  Radiofrequency rhizolysis lumbar facet, medial branch nerves, to be performed at time return appointment  F/U PCP Dr. Gavin Potters for evaliation of  BP and general medical  condition  F/U surgical evaluation. May consider pending follow-up evaluations  F/U neurological evaluation. May consider pending follow-up evaluations  F/U Dr.Zolnoun as planned   May consider radiofrequency rhizolysis . We will request radiofrequency rhizolysis lumbar facet, medial branch nerves to be approved by insurance company at this time.   Patient to call [facility] Center should patient have concerns prior to scheduled return appointment.       Assessment & Plan:

## 2015-08-24 NOTE — Progress Notes (Signed)
Safety precautions to be maintained throughout the outpatient stay will include: orient to surroundings, keep bed in low position, maintain call bell within reach at all times, provide assistance with transfer out of bed and ambulation.  

## 2015-08-24 NOTE — Patient Instructions (Addendum)
PLAN   Continue present medication Neurontin and hydrocodone acetaminophen  F/U PCP Dr. Gavin Potters for evaliation of  BP and general medical  condition  F/U surgical evaluation. May onsider further evaluation as discussed  F/U neurological evaluation. May consider pending follow-up evaluations  F/U Dr. Orion Crook as discussed  May consider radiofrequency rhizolysis or intraspinal procedures pending response to present treatment and F/U evaluation   Patient to call [facility] should patient have concerns prior to scheduled return appointment. Facet Blocks Patient Information  Description: The facets are joints in the spine between the vertebrae.  Like any joints in the body, facets can become irritated and painful.  Arthritis can also effect the facets.  By injecting steroids and local anesthetic in and around these joints, we can temporarily block the nerve supply to them.  Steroids act directly on irritated nerves and tissues to reduce selling and inflammation which often leads to decreased pain.  Facet blocks may be done anywhere along the spine from the neck to the low back depending upon the location of your pain.   After numbing the skin with local anesthetic (like Novocaine), a small needle is passed onto the facet joints under x-ray guidance.  You may experience a sensation of pressure while this is being done.  The entire block usually lasts about 15-25 minutes.   Conditions which may be treated by facet blocks:   Low back/buttock pain  Neck/shoulder pain  Certain types of headaches  Preparation for the injection:  1. Do not eat any solid food or dairy products within 6 hours of your appointment. 2. You may drink clear liquid up to 2 hours before appointment.  Clear liquids include water, black coffee, juice or soda.  No milk or cream please. 3. You may take your regular medication, including pain medications, with a sip of water before your appointment.  Diabetics  should hold regular insulin (if taken separately) and take 1/2 normal NPH dose the morning of the procedure.  Carry some sugar containing items with you to your appointment. 4. A driver must accompany you and be prepared to drive you home after your procedure. 5. Bring all your current medications with you. 6. An IV may be inserted and sedation may be given at the discretion of the physician. 7. A blood pressure cuff, EKG and other monitors will often be applied during the procedure.  Some patients may need to have extra oxygen administered for a short period. 8. You will be asked to provide medical information, including your allergies and medications, prior to the procedure.  We must know immediately if you are taking blood thinners (like Coumadin/Warfarin) or if you are allergic to IV iodine contrast (dye).  We must know if you could possible be pregnant.  Possible side-effects:   Bleeding from needle site  Infection (rare, may require surgery)  Nerve injury (rare)  Numbness & tingling (temporary)  Difficulty urinating (rare, temporary)  Spinal headache (a headache worse with upright posture)  Light-headedness (temporary)  Pain at injection site (serveral days)  Decreased blood pressure (rare, temporary)  Weakness in arm/leg (temporary)  Pressure sensation in back/neck (temporary)   Call if you experience:   Fever/chills associated with headache or increased back/neck pain  Headache worsened by an upright position  New onset, weakness or numbness of an extremity below the injection site  Hives or difficulty breathing (go to the emergency room)  Inflammation or drainage at the injection site(s)  Severe back/neck pain greater than usual  New symptoms which are concerning to you  Please note:  Although the local anesthetic injected can often make your back or neck feel good for several hours after the injection, the pain will likely return. It takes 3-7 days for  steroids to work.  You may not notice any pain relief for at least one week.  If effective, we will often do a series of 2-3 injections spaced 3-6 weeks apart to maximally decrease your pain.  After the initial series, you may be a candidate for a more permanent nerve block of the facets.  If you have any questions, please call #[phone] [facility] Clinic[facility] Les[facility]ing Radiofrequency lesioning is a procedure to relieve pain. The procedure is often used for back, neck, or arm pain. Radiofrequency lesioning uses a specialized machine that creates radio waves to make heat. The heat damages the nerve that carries the pain signal. Pain relief usually lasts 6 months to 1 year.  LET YOUR CAREGIVER KNOW ABOUT: 4. Allergies to food or medicine. 5. Medicines taken, including vitamins, herbs, eyedrops, over-the-counter medicines, and creams. 6. Use of steroids (by mouth or creams). 7. Previous problems with anesthetics or numbing medicines. 8. History of bleeding problems or blood clots. 9. Previous surgery. 10. Other health problems, including diabetes and kidney problems. 11. Possibility of pregnancy, if this applies. 12. Breathing problems and smoking history. 13. Any recent colds or infections. RISKS AND COMPLICATIONS This procedure is generally safe. The risks and complications depend on what treatment site is used. General complications may include:  Pain or soreness at the injection site.  Infection at the injection site.  Damage to nerves or blood vessels. BEFORE THE PROCEDURE 9. Ask your caregiver about changing or stopping any medicines you are on before the procedure. 10. If you take blood thinners, ask if you should stop taking them before the procedure. 11. You may be asked to wash with an antibiotic soap before the procedure. 12. Do not eat or drink for 8 hours before your procedure or as told by your caregiver. 13. Ask your caregiver what  time you need to arrive for your procedure. 14. This is an outpatient procedure. This means you will be able to go home the same day. Make plans for someone to drive you home. PROCEDURE  You will be awake during the procedure. You need to be able to talk to the surgeon during the procedure. However, you might be given medicine to help you relax (sedative).  Medicine to numb the area (local anesthetic) will be injected.  With the help of a type of X-ray (fluoroscopy), a radio frequency needle will be inserted into the area to be treated. Then, a wire that carries the radio waves (electrode) will be put through the radio frequency needle. An electrical pulse will be sent through the electrode to verify the correct nerve.  You will feel a tingling sensation similar to hitting your "funny bone." You may also have muscle twitching. The tissue around the needle tip is then heated when electric current is passed using the radio frequency machine. This numbs the nerves.  A bandage (dressing) will be put on the area after the procedure is done. AFTER THE PROCEDURE 12. You will stay in a recovery area until you are awake enough to eat and drink. 13. Once everything is back to normal, you will be able to go home. 14. You will need to arrange for someone to drive you home if you received a  sedative or pain relieving medicine during the procedure. Document Released: 08/01/2011 Document Revised: 02/25/2012 Document Reviewed: 08/01/2011 [facility] Patient Information 2015 [place], [place]. This information is not intended to replace advice given to you by your health care provider. Make sure you discuss any questions you have with your health care provider. GENERAL RISKS AND COMPLICATIONS  What are the risk, side effects and possible complications? Generally speaking, most procedures are safe.  However, with any procedure there are risks, side effects, and the possibility of complications.  The risks and  complications are dependent upon the sites that are lesioned, or the type of nerve block to be performed.  The closer the procedure is to the spine, the more serious the risks are.  Great care is taken when placing the radio frequency needles, block needles or lesioning probes, but sometimes complications can occur. 1. Infection: Any time there is an injection through the skin, there is a risk of infection.  This is why sterile conditions are used for these blocks.  There are four possible types of infection. 1. Localized skin infection. 2. Central Nervous System Infection-This can be in the form of Meningitis, which can be deadly. 3. Epidural Infections-This can be in the form of an epidural abscess, which can cause pressure inside of the spine, causing compression of the spinal cord with subsequent paralysis. This would require an emergency surgery to decompress, and there are no guarantees that the patient would recover from the paralysis. 4. Discitis-This is an infection of the intervertebral discs.  It occurs in about 1% of discography procedures.  It is difficult to treat and it may lead to surgery.        2. Pain: the needles have to go through skin and soft tissues, will cause soreness.       3. Damage to internal structures:  The nerves to be lesioned may be near blood vessels or    other nerves which can be potentially damaged.       4. Bleeding: Bleeding is more common if the patient is taking blood thinners such as  aspirin, Coumadin, Ticiid, Plavix, etc., or if he/she have some genetic predisposition  such as hemophilia. Bleeding into the spinal canal can cause compression of the spinal  cord with subsequent paralysis.  This would require an emergency surgery to  decompress and there are no guarantees that the patient would recover from the  paralysis.       5. Pneumothorax:  Puncturing of a lung is a possibility, every time a needle is introduced in  the area of the chest or upper back.   Pneumothorax refers to free air around the  collapsed lung(s), inside of the thoracic cavity (chest cavity).  Another two possible  complications related to a similar event would include: Hemothorax and Chylothorax.   These are variations of the Pneumothorax, where instead of air around the collapsed  lung(s), you may have blood or chyle, respectively.       6. Spinal headaches: They may occur with any procedures in the area of the spine.       7. Persistent CSF (Cerebro-Spinal Fluid) leakage: This is a rare problem, but may occur  with prolonged intrathecal or epidural catheters either due to the formation of a fistulous  track or a dural tear.       8. Nerve damage: By working so close to the spinal cord, there is always a possibility of  nerve damage, which could be as serious as  a permanent spinal cord injury with  paralysis.       9. Death:  Although rare, severe deadly allergic reactions known as "Anaphylactic  reaction" can occur to any of the medications used.      10. Worsening of the symptoms:  We can always make thing worse.  What are the chances of something like this happening? Chances of any of this occuring are extremely low.  By statistics, you have more of a chance of getting killed in a motor vehicle accident: while driving to the hospital than any of the above occurring .  Nevertheless, you should be aware that they are possibilities.  In general, it is similar to taking a shower.  Everybody knows that you can slip, hit your head and get killed.  Does that mean that you should not shower again?  Nevertheless always keep in mind that statistics do not mean anything if you happen to be on the wrong side of them.  Even if a procedure has a 1 (one) in a 1,000,000 (million) chance of going wrong, it you happen to be that one..Also, keep in mind that by statistics, you have more of a chance of having something go wrong when taking medications.  Who should not have this procedure? If you are  on a blood thinning medication (e.g. Coumadin, Plavix, see list of "Blood Thinners"), or if you have an active infection going on, you should not have the procedure.  If you are taking any blood thinners, please inform your physician.  How should I prepare for this procedure?  Do not eat or drink anything at least six hours prior to the procedure.  Bring a driver with you .  It cannot be a taxi.  Come accompanied by an adult that can drive you back, and that is strong enough to help you if your legs get weak or numb from the local anesthetic.  Take all of your medicines the morning of the procedure with just enough water to swallow them.  If you have diabetes, make sure that you are scheduled to have your procedure done first thing in the morning, whenever possible.  If you have diabetes, take only half of your insulin dose and notify our nurse that you have done so as soon as you arrive at the clinic.  If you are diabetic, but only take blood sugar pills (oral hypoglycemic), then do not take them on the morning of your procedure.  You may take them after you have had the procedure.  Do not take aspirin or any aspirin-containing medications, at least eleven (11) days prior to the procedure.  They may prolong bleeding.  Wear loose fitting clothing that may be easy to take off and that you would not mind if it got stained with Betadine or blood.  Do not wear any jewelry or perfume  Remove any nail coloring.  It will interfere with some of our monitoring equipment.  NOTE: Remember that this is not meant to be interpreted as a complete list of all possible complications.  Unforeseen problems may occur.  BLOOD THINNERS The following drugs contain aspirin or other products, which can cause increased bleeding during surgery and should not be taken for 2 weeks prior to and 1 week after surgery.  If you should need take something for relief of minor pain, you may take acetaminophen which is found  in Tylenol,m Datril, Anacin-3 and Panadol. It is not blood thinner. The products listed below are.  Do  not take any of the products listed below in addition to any listed on your instruction sheet.  A.P.C or A.P.C with Codeine Codeine Phosphate Capsules #3 Ibuprofen Ridaura  ABC compound Congesprin Imuran rimadil  Advil Cope Indocin Robaxisal  Alka-Seltzer Effervescent Pain Reliever and Antacid Coricidin or Coricidin-D  Indomethacin Rufen  Alka-Seltzer plus Cold Medicine Cosprin Ketoprofen S-A-C Tablets  Anacin Analgesic Tablets or Capsules Coumadin Korlgesic Salflex  Anacin Extra Strength Analgesic tablets or capsules CP-2 Tablets Lanoril Salicylate  Anaprox Cuprimine Capsules Levenox Salocol  Anexsia-D Dalteparin Magan Salsalate  Anodynos Darvon compound Magnesium Salicylate Sine-off  Ansaid Dasin Capsules Magsal Sodium Salicylate  Anturane Depen Capsules Marnal Soma  APF Arthritis pain formula Dewitt's Pills Measurin Stanback  Argesic Dia-Gesic Meclofenamic Sulfinpyrazone  Arthritis Bayer Timed Release Aspirin Diclofenac Meclomen Sulindac  Arthritis pain formula Anacin Dicumarol Medipren Supac  Analgesic (Safety coated) Arthralgen Diffunasal Mefanamic Suprofen  Arthritis Strength Bufferin Dihydrocodeine Mepro Compound Suprol  Arthropan liquid Dopirydamole Methcarbomol with Aspirin Synalgos  ASA tablets/Enseals Disalcid Micrainin Tagament  Ascriptin Doan's Midol Talwin  Ascriptin A/D Dolene Mobidin Tanderil  Ascriptin Extra Strength Dolobid Moblgesic Ticlid  Ascriptin with Codeine Doloprin or Doloprin with Codeine Momentum Tolectin  Asperbuf Duoprin Mono-gesic Trendar  Aspergum Duradyne Motrin or Motrin IB Triminicin  Aspirin plain, buffered or enteric coated Durasal Myochrisine Trigesic  Aspirin Suppositories Easprin Nalfon Trillsate  Aspirin with Codeine Ecotrin Regular or Extra Strength Naprosyn Uracel  Atromid-S Efficin Naproxen Ursinus  Auranofin Capsules Elmiron Neocylate  Vanquish  Axotal Emagrin Norgesic Verin  Azathioprine Empirin or Empirin with Codeine Normiflo Vitamin E  Azolid Emprazil Nuprin Voltaren  Bayer Aspirin plain, buffered or children's or timed BC Tablets or powders Encaprin Orgaran Warfarin Sodium  Buff-a-Comp Enoxaparin Orudis Zorpin  Buff-a-Comp with Codeine Equegesic Os-Cal-Gesic   Buffaprin Excedrin plain, buffered or Extra Strength Oxalid   Bufferin Arthritis Strength Feldene Oxphenbutazone   Bufferin plain or Extra Strength Feldene Capsules Oxycodone with Aspirin   Bufferin with Codeine Fenoprofen Fenoprofen Pabalate or Pabalate-SF   Buffets II Flogesic Panagesic   Buffinol plain or Extra Strength Florinal or Florinal with Codeine Panwarfarin   Buf-Tabs Flurbiprofen Penicillamine   Butalbital Compound Four-way cold tablets Penicillin   Butazolidin Fragmin Pepto-Bismol   Carbenicillin Geminisyn Percodan   Carna Arthritis Reliever Geopen Persantine   Carprofen Gold's salt Persistin   Chloramphenicol Goody's Phenylbutazone   Chloromycetin Haltrain Piroxlcam   Clmetidine heparin Plaquenil   Cllnoril Hyco-pap Ponstel   Clofibrate Hydroxy chloroquine Propoxyphen         Before stopping any of these medications, be sure to consult the physician who ordered them.  Some, such as Coumadin (Warfarin) are ordered to prevent or treat serious conditions such as "deep thrombosis", "pumonary embolisms", and other heart problems.  The amount of time that you may need off of the medication may also vary with the medication and the reason for which you were taking it.  If you are taking any of these medications, please make sure you notify your pain physician before you undergo any procedures.

## 2015-09-06 ENCOUNTER — Other Ambulatory Visit: Payer: Self-pay | Admitting: Pain Medicine

## 2015-09-07 ENCOUNTER — Ambulatory Visit: Payer: Medicare Other | Attending: Pain Medicine | Admitting: Pain Medicine

## 2015-09-07 ENCOUNTER — Encounter: Payer: Self-pay | Admitting: Pain Medicine

## 2015-09-07 VITALS — BP 110/61 | HR 71 | Temp 98.6°F | Resp 16 | Ht 64.0 in | Wt 250.0 lb

## 2015-09-07 DIAGNOSIS — M47818 Spondylosis without myelopathy or radiculopathy, sacral and sacrococcygeal region: Secondary | ICD-10-CM

## 2015-09-07 DIAGNOSIS — M461 Sacroiliitis, not elsewhere classified: Secondary | ICD-10-CM

## 2015-09-07 DIAGNOSIS — M47816 Spondylosis without myelopathy or radiculopathy, lumbar region: Secondary | ICD-10-CM | POA: Insufficient documentation

## 2015-09-07 DIAGNOSIS — M545 Low back pain: Secondary | ICD-10-CM | POA: Diagnosis present

## 2015-09-07 DIAGNOSIS — N803 Endometriosis of pelvic peritoneum, unspecified: Secondary | ICD-10-CM

## 2015-09-07 DIAGNOSIS — M79605 Pain in left leg: Secondary | ICD-10-CM | POA: Diagnosis present

## 2015-09-07 DIAGNOSIS — M533 Sacrococcygeal disorders, not elsewhere classified: Secondary | ICD-10-CM

## 2015-09-07 DIAGNOSIS — M79604 Pain in right leg: Secondary | ICD-10-CM | POA: Diagnosis present

## 2015-09-07 DIAGNOSIS — M5136 Other intervertebral disc degeneration, lumbar region: Secondary | ICD-10-CM

## 2015-09-07 DIAGNOSIS — M25559 Pain in unspecified hip: Secondary | ICD-10-CM

## 2015-09-07 MED ORDER — LIDOCAINE HCL (PF) 1 % IJ SOLN
INTRAMUSCULAR | Status: AC
  Administered 2015-09-07: 20 mL
  Filled 2015-09-07: qty 10

## 2015-09-07 MED ORDER — MIDAZOLAM HCL 5 MG/5ML IJ SOLN
INTRAMUSCULAR | Status: AC
  Administered 2015-09-07: 4 mg via INTRAVENOUS
  Filled 2015-09-07: qty 5

## 2015-09-07 MED ORDER — FENTANYL CITRATE (PF) 100 MCG/2ML IJ SOLN
INTRAMUSCULAR | Status: AC
  Administered 2015-09-07: 100 ug via INTRAVENOUS
  Filled 2015-09-07: qty 2

## 2015-09-07 NOTE — Progress Notes (Signed)
Subjective:    Patient ID: [name], female    DOB: 08/26/1976, 39 y.o.   MRN: [id number]  HPI  PROCEDURE PERFORMED: Radiofrequency rhizolysis (lunbar facet medial branch nerve radiofrequency rhizolysis).  The procedure was performed with the COOLIEF technique.  COOLIEF technique was performed using the Synergy cooled radiofrequency probe [id number]-17-150-4, the synergy cooled radiofrequency introducer [id number], and the synergy cooled sterile tube kit [id number].   HISTORY: The patient is a 39 y.o. female who returns to the [facility] for further evaluation of severely disabling pain involving the lumbar and lower extremity region.  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 The patient has had significant relief of pain following previous lumbar facet, medial branch nerve, blocks. Radiofrequency rhizolysis of the lumbar facets, medial branch nerves, will be performed at this time an attempt to provide longer lasting relief of the patient's pain, minimize progression of the patient's symptoms, and avoid the need for more involved treatment. The risks, benefits and expectations of the procedure have been discussed and explained to the patient who was with understanding and wished to proceed with interventional treatment in an attempt to decrease severely  disabling pain of the lumbar and lower extremity region.  We will proceed with what is felt to be a medically necessary procedure, radiofrequency rhizolysis (lumber facet medial branch nerve radiofrequency rhizolysis) using the COOLIEF technique.  DESCRIPTION OF PROCEDURE: COOLIEF Technique Radiofrequency Rhizolysis of Lumbar Facets (Medial Branch Nerves Radiofrequency Rhizolysis) with IV Versed, IV Fentanyl conscious sedation, EKG, blood pressure, pulse, and pulse oximetry monitoring.  The procedure  was performed with the patient in the prone position under fluoroscopic guidance.  Following Betadine prep of the proposed entry site, a 1.5% lidocaine plain skin wheal of the proposed needle entry site was  prepared in oblique orientation.   Left, L5 Radiofrequency Rhizolysis L5 Facet  (Medial Branch Nerve): Under fluoroscopic guidance, the radiofrequency needle was inserted at the L5 vertebral body level after a local anesthetic skin wheal of 1.5% lidocaine plain and Betadine prep of the proposed needle entry site was performed.  The needle was inserted under fluoroscopic guidance in the area known as Burton's eye or eye of the Scotty dog with needle placed at the superior and lateral border of the targeted area with oblique orientation utilizing the tunnel (gun  barrel approach) technique to the targeted area.  Left Radiofrequency Rhizolysis at L3 and L4  Lumbar Facets (Medial Branch Nerves) The procedure was performed at L3 and L4 exactly as was performed at the L5 and under fluoroscopic guidance utilizing the identical technique as utilized at the L5 vertebral body level.  Needle was then verified on lateral view and following needle placement verification on lateral view, radiofrequency testing was then carried out with motor testing at 2 Hz stimulation without evidence of stimulation of the lower extremity. Following radiofrequency testing for motor testing, each radiofrequency probe was then prepared with 2 mL of preservative-free lidocaine and following 1 minute of anesthetizing each proposed radiofrequency lesioning  Level, the radiofrequency probe was then inserted in the left, L5 vertebral body level needle with radiofrequency lesioning carried out for 2-1/2 minutes with temperature of the tissue being maintained at 80 degrees centigrade for 2-1/2 minutes.  Radiofrequency probe was then inserted in the left, L4 vertebral body level needle and radiofrequency lesioning was performed at 80 degrees  centigrade tissue  temperature for 2-1/2 minutes.  Radiofrequency probe was then inserted in the left,L3 vertebral body level needle and radiofrequency lesioning was performed at 80 degrees centigrade tissue temperature for 2-1/2 minutes  The patient tolerated the procedure well.   PLAN: 1. Medications: The patient will continue the present medications.Neurontin and hydrocodone acetaminophen 2. The patient will follow up with Dr. Hoy Morn regarding blood pressure and general medical condition as discussed with the patient.  3. Surgical evaluation as discussed. . Patient will follow-up with Dr.Zolnoun as planned 4. Neurological evaluation as discussed.  5. The patient has been advised to call the Pain Management Center prior to scheduled return appointment should there be significant change in the patient's condition or should the patient have other concerns regarding condition prior to scheduled return appointment.  The patient is with understanding and is in agreement with suggested treatment plan.              Review of Systems     Objective:   Physical Exam        Assessment & Plan:

## 2015-09-07 NOTE — Patient Instructions (Addendum)
Pain Management Discharge Instructions PLAN   Continue present medication Neurontin and hydrocodone acetaminophen  F/U PCP Dr. Gavin Potters for evaliation of  BP and general medical  condition  F/U surgical evaluation. May consider pending follow-up evaluations  F/U neurological evaluation. May consider pending follow-up evaluations  F/U Dr.Zolnoun as planned   May consider radiofrequency rhizolysis or intraspinal procedures pending response to present treatment and F/U evaluation   Patient to call [facility] should patient have concerns prior to scheduled return appointment. General Discharge Instructions :  If you need to reach your doctor call: Monday-Friday 8:00 am - 4:00 pm at [phone] or toll free [phone].  After clinic hours [phone] to have operator reach doctor.  Bring all of your medication bottles to all your appointments in the pain clinic.  To cancel or reschedule your appointment with Pain Management please remember to call 24 hours in advance to avoid a fee.  Refer to the educational materials which you have been given on: General Risks, I had my Procedure. Discharge Instructions, Post Sedation.  Post Procedure Instructions:  The drugs you were given will stay in your system until tomorrow, so for the next 24 hours you should not drive, make any legal decisions or drink any alcoholic beverages.  You may eat anything you prefer, but it is better to start with liquids then soups and crackers, and gradually work up to solid foods.  Please notify your doctor immediately if you have any unusual bleeding, trouble breathing or pain that is not related to your normal pain.  Depending on the type of procedure that was done, some parts of your body may feel week and/or numb.  This usually clears up by tonight or the next day.  Walk with the use of an assistive device or accompanied by an adult for the 24 hours.  You may use ice on the affected area  for the first 24 hours.  Put ice in a Ziploc bag and cover with a towel and place against area 15 minutes on 15 minutes off.  You may switch to heat after 24 hours.Radiofrequency Lesioning Radiofrequency lesioning is a procedure to relieve pain. The procedure is often used for back, neck, or arm pain. Radiofrequency lesioning uses a specialized machine that creates radio waves to make heat. The heat damages the nerve that carries the pain signal. Pain relief usually lasts 6 months to 1 year.  LET YOUR CAREGIVER KNOW ABOUT:  Allergies to food or medicine.  Medicines taken, including vitamins, herbs, eyedrops, over-the-counter medicines, and creams.  Use of steroids (by mouth or creams).  Previous problems with anesthetics or numbing medicines.  History of bleeding problems or blood clots.  Previous surgery.  Other health problems, including diabetes and kidney problems.  Possibility of pregnancy, if this applies.  Breathing problems and smoking history.  Any recent colds or infections. RISKS AND COMPLICATIONS This procedure is generally safe. The risks and complications depend on what treatment site is used. General complications may include:  Pain or soreness at the injection site.  Infection at the injection site.  Damage to nerves or blood vessels. BEFORE THE PROCEDURE  Ask your caregiver about changing or stopping any medicines you are on before the procedure.  If you take blood thinners, ask if you should stop taking them before the procedure.  You may be asked to wash with an antibiotic soap before the procedure.  Do not eat or drink for 8 hours before your procedure or as told by  your caregiver.  Ask your caregiver what time you need to arrive for your procedure.  This is an outpatient procedure. This means you will be able to go home the same day. Make plans for someone to drive you home. PROCEDURE  You will be awake during the procedure. You need to be able to  talk to the surgeon during the procedure. However, you might be given medicine to help you relax (sedative).  Medicine to numb the area (local anesthetic) will be injected.  With the help of a type of X-ray (fluoroscopy), a radio frequency needle will be inserted into the area to be treated. Then, a wire that carries the radio waves (electrode) will be put through the radio frequency needle. An electrical pulse will be sent through the electrode to verify the correct nerve.  You will feel a tingling sensation similar to hitting your "funny bone." You may also have muscle twitching. The tissue around the needle tip is then heated when electric current is passed using the radio frequency machine. This numbs the nerves.  A bandage (dressing) will be put on the area after the procedure is done. AFTER THE PROCEDURE  You will stay in a recovery area until you are awake enough to eat and drink.  Once everything is back to normal, you will be able to go home.  You will need to arrange for someone to drive you home if you received a sedative or pain relieving medicine during the procedure. Document Released: 08/01/2011 Document Revised: 02/25/2012 Document Reviewed: 08/01/2011 [facility] Patient Information 2015 [place], [place]. This information is not intended to replace advice given to you by your health care Carlyon Nolasco. Make sure you discuss any questions you have with your health care Ajane Novella.

## 2015-09-07 NOTE — Progress Notes (Signed)
Safety precautions to be maintained throughout the outpatient stay will include: orient to surroundings, keep bed in low position, maintain call bell within reach at all times, provide assistance with transfer out of bed and ambulation.  

## 2015-09-08 ENCOUNTER — Telehealth: Payer: Self-pay

## 2015-09-08 NOTE — Telephone Encounter (Signed)
No problems from procedure yesterday

## 2015-09-21 ENCOUNTER — Ambulatory Visit: Payer: Medicare Other | Attending: Pain Medicine | Admitting: Pain Medicine

## 2015-09-21 ENCOUNTER — Encounter: Payer: Self-pay | Admitting: Pain Medicine

## 2015-09-21 VITALS — BP 95/53 | HR 63 | Temp 98.7°F | Resp 14 | Ht 64.0 in | Wt 253.0 lb

## 2015-09-21 DIAGNOSIS — N803 Endometriosis of pelvic peritoneum, unspecified: Secondary | ICD-10-CM

## 2015-09-21 DIAGNOSIS — N809 Endometriosis, unspecified: Secondary | ICD-10-CM | POA: Diagnosis not present

## 2015-09-21 DIAGNOSIS — R109 Unspecified abdominal pain: Secondary | ICD-10-CM | POA: Insufficient documentation

## 2015-09-21 DIAGNOSIS — M47816 Spondylosis without myelopathy or radiculopathy, lumbar region: Secondary | ICD-10-CM

## 2015-09-21 DIAGNOSIS — R102 Pelvic and perineal pain: Secondary | ICD-10-CM | POA: Insufficient documentation

## 2015-09-21 DIAGNOSIS — M25559 Pain in unspecified hip: Secondary | ICD-10-CM

## 2015-09-21 DIAGNOSIS — M5136 Other intervertebral disc degeneration, lumbar region: Secondary | ICD-10-CM | POA: Insufficient documentation

## 2015-09-21 DIAGNOSIS — M533 Sacrococcygeal disorders, not elsewhere classified: Secondary | ICD-10-CM

## 2015-09-21 DIAGNOSIS — M47818 Spondylosis without myelopathy or radiculopathy, sacral and sacrococcygeal region: Secondary | ICD-10-CM

## 2015-09-21 DIAGNOSIS — M461 Sacroiliitis, not elsewhere classified: Secondary | ICD-10-CM

## 2015-09-21 DIAGNOSIS — M545 Low back pain: Secondary | ICD-10-CM | POA: Diagnosis present

## 2015-09-21 DIAGNOSIS — M51369 Other intervertebral disc degeneration, lumbar region without mention of lumbar back pain or lower extremity pain: Secondary | ICD-10-CM

## 2015-09-21 MED ORDER — CYCLOBENZAPRINE HCL 10 MG PO TABS: ORAL_TABLET | ORAL | Status: DC

## 2015-09-21 MED ORDER — HYDROCODONE-ACETAMINOPHEN 7.5-325 MG/15ML PO SOLN: ORAL | Status: DC

## 2015-09-21 MED ORDER — GABAPENTIN 100 MG PO CAPS: 100.0000 mg | ORAL_CAPSULE | Freq: Three times a day (TID) | ORAL | Status: DC

## 2015-09-21 NOTE — Progress Notes (Signed)
Safety precautions to be maintained throughout the outpatient stay will include: orient to surroundings, keep bed in low position, maintain call bell within reach at all times, provide assistance with transfer out of bed and ambulation.  

## 2015-09-21 NOTE — Patient Instructions (Addendum)
PLAN   Continue present medication Neurontin Flexeril and hydrocodone acetaminophen  Radiofrequency rhizolysis lumbar facet, medial branch nerves, to be performed at time return appointment  F/U PCP for evaliation of  BP and general medical  condition  F/U surgical evaluation. May consider pending follow-up evaluations  F/U neurological evaluation. May consider pending follow-up evaluations  F/U Dr Orion Crook as planned  May consider radiofrequency rhizolysis or intraspinal procedures pending response to present treatment and F/U evaluation   Patient to call Pain Management Center should patient have concerns prior to scheduled return appointment. GENERAL RISKS AND COMPLICATIONS  What are the risk, side effects and possible complications? Generally speaking, most procedures are safe.  However, with any procedure there are risks, side effects, and the possibility of complications.  The risks and complications are dependent upon the sites that are lesioned, or the type of nerve block to be performed.  The closer the procedure is to the spine, the more serious the risks are.  Great care is taken when placing the radio frequency needles, block needles or lesioning probes, but sometimes complications can occur. 1. Infection: Any time there is an injection through the skin, there is a risk of infection.  This is why sterile conditions are used for these blocks.  There are four possible types of infection. 1. Localized skin infection. 2. Central Nervous System Infection-This can be in the form of Meningitis, which can be deadly. 3. Epidural Infections-This can be in the form of an epidural abscess, which can cause pressure inside of the spine, causing compression of the spinal cord with subsequent paralysis. This would require an emergency surgery to decompress, and there are no guarantees that the patient would recover from the paralysis. 4. Discitis-This is an infection of the intervertebral discs.   It occurs in about 1% of discography procedures.  It is difficult to treat and it may lead to surgery.        2. Pain: the needles have to go through skin and soft tissues, will cause soreness.       3. Damage to internal structures:  The nerves to be lesioned may be near blood vessels or    other nerves which can be potentially damaged.       4. Bleeding: Bleeding is more common if the patient is taking blood thinners such as  aspirin, Coumadin, Ticiid, Plavix, etc., or if he/she have some genetic predisposition  such as hemophilia. Bleeding into the spinal canal can cause compression of the spinal  cord with subsequent paralysis.  This would require an emergency surgery to  decompress and there are no guarantees that the patient would recover from the  paralysis.       5. Pneumothorax:  Puncturing of a lung is a possibility, every time a needle is introduced in  the area of the chest or upper back.  Pneumothorax refers to free air around the  collapsed lung(s), inside of the thoracic cavity (chest cavity).  Another two possible  complications related to a similar event would include: Hemothorax and Chylothorax.   These are variations of the Pneumothorax, where instead of air around the collapsed  lung(s), you may have blood or chyle, respectively.       6. Spinal headaches: They may occur with any procedures in the area of the spine.       7. Persistent CSF (Cerebro-Spinal Fluid) leakage: This is a rare problem, but may occur  with prolonged intrathecal or epidural catheters either due to  the formation of a fistulous  track or a dural tear.       8. Nerve damage: By working so close to the spinal cord, there is always a possibility of  nerve damage, which could be as serious as a permanent spinal cord injury with  paralysis.       9. Death:  Although rare, severe deadly allergic reactions known as "Anaphylactic  reaction" can occur to any of the medications used.      10. Worsening of the symptoms:  We  can always make thing worse.  What are the chances of something like this happening? Chances of any of this occuring are extremely low.  By statistics, you have more of a chance of getting killed in a motor vehicle accident: while driving to the hospital than any of the above occurring .  Nevertheless, you should be aware that they are possibilities.  In general, it is similar to taking a shower.  Everybody knows that you can slip, hit your head and get killed.  Does that mean that you should not shower again?  Nevertheless always keep in mind that statistics do not mean anything if you happen to be on the wrong side of them.  Even if a procedure has a 1 (one) in a 1,000,000 (million) chance of going wrong, it you happen to be that one..Also, keep in mind that by statistics, you have more of a chance of having something go wrong when taking medications.  Who should not have this procedure? If you are on a blood thinning medication (e.g. Coumadin, Plavix, see list of "Blood Thinners"), or if you have an active infection going on, you should not have the procedure.  If you are taking any blood thinners, please inform your physician.  How should I prepare for this procedure?  Do not eat or drink anything at least six hours prior to the procedure.  Bring a driver with you .  It cannot be a taxi.  Come accompanied by an adult that can drive you back, and that is strong enough to help you if your legs get weak or numb from the local anesthetic.  Take all of your medicines the morning of the procedure with just enough water to swallow them.  If you have diabetes, make sure that you are scheduled to have your procedure done first thing in the morning, whenever possible.  If you have diabetes, take only half of your insulin dose and notify our nurse that you have done so as soon as you arrive at the clinic.  If you are diabetic, but only take blood sugar pills (oral hypoglycemic), then do not take them  on the morning of your procedure.  You may take them after you have had the procedure.  Do not take aspirin or any aspirin-containing medications, at least eleven (11) days prior to the procedure.  They may prolong bleeding.  Wear loose fitting clothing that may be easy to take off and that you would not mind if it got stained with Betadine or blood.  Do not wear any jewelry or perfume  Remove any nail coloring.  It will interfere with some of our monitoring equipment.  NOTE: Remember that this is not meant to be interpreted as a complete list of all possible complications.  Unforeseen problems may occur.  BLOOD THINNERS The following drugs contain aspirin or other products, which can cause increased bleeding during surgery and should not be taken for 2 weeks prior  to and 1 week after surgery.  If you should need take something for relief of minor pain, you may take acetaminophen which is found in Tylenol,m Datril, Anacin-3 and Panadol. It is not blood thinner. The products listed below are.  Do not take any of the products listed below in addition to any listed on your instruction sheet.  A.P.C or A.P.C with Codeine Codeine Phosphate Capsules #3 Ibuprofen Ridaura  ABC compound Congesprin Imuran rimadil  Advil Cope Indocin Robaxisal  Alka-Seltzer Effervescent Pain Reliever and Antacid Coricidin or Coricidin-D  Indomethacin Rufen  Alka-Seltzer plus Cold Medicine Cosprin Ketoprofen S-A-C Tablets  Anacin Analgesic Tablets or Capsules Coumadin Korlgesic Salflex  Anacin Extra Strength Analgesic tablets or capsules CP-2 Tablets Lanoril Salicylate  Anaprox Cuprimine Capsules Levenox Salocol  Anexsia-D Dalteparin Magan Salsalate  Anodynos Darvon compound Magnesium Salicylate Sine-off  Ansaid Dasin Capsules Magsal Sodium Salicylate  Anturane Depen Capsules Marnal Soma  APF Arthritis pain formula Dewitt's Pills Measurin Stanback  Argesic Dia-Gesic Meclofenamic Sulfinpyrazone  Arthritis Bayer  Timed Release Aspirin Diclofenac Meclomen Sulindac  Arthritis pain formula Anacin Dicumarol Medipren Supac  Analgesic (Safety coated) Arthralgen Diffunasal Mefanamic Suprofen  Arthritis Strength Bufferin Dihydrocodeine Mepro Compound Suprol  Arthropan liquid Dopirydamole Methcarbomol with Aspirin Synalgos  ASA tablets/Enseals Disalcid Micrainin Tagament  Ascriptin Doan's Midol Talwin  Ascriptin A/D Dolene Mobidin Tanderil  Ascriptin Extra Strength Dolobid Moblgesic Ticlid  Ascriptin with Codeine Doloprin or Doloprin with Codeine Momentum Tolectin  Asperbuf Duoprin Mono-gesic Trendar  Aspergum Duradyne Motrin or Motrin IB Triminicin  Aspirin plain, buffered or enteric coated Durasal Myochrisine Trigesic  Aspirin Suppositories Easprin Nalfon Trillsate  Aspirin with Codeine Ecotrin Regular or Extra Strength Naprosyn Uracel  Atromid-S Efficin Naproxen Ursinus  Auranofin Capsules Elmiron Neocylate Vanquish  Axotal Emagrin Norgesic Verin  Azathioprine Empirin or Empirin with Codeine Normiflo Vitamin E  Azolid Emprazil Nuprin Voltaren  Bayer Aspirin plain, buffered or children's or timed BC Tablets or powders Encaprin Orgaran Warfarin Sodium  Buff-a-Comp Enoxaparin Orudis Zorpin  Buff-a-Comp with Codeine Equegesic Os-Cal-Gesic   Buffaprin Excedrin plain, buffered or Extra Strength Oxalid   Bufferin Arthritis Strength Feldene Oxphenbutazone   Bufferin plain or Extra Strength Feldene Capsules Oxycodone with Aspirin   Bufferin with Codeine Fenoprofen Fenoprofen Pabalate or Pabalate-SF   Buffets II Flogesic Panagesic   Buffinol plain or Extra Strength Florinal or Florinal with Codeine Panwarfarin   Buf-Tabs Flurbiprofen Penicillamine   Butalbital Compound Four-way cold tablets Penicillin   Butazolidin Fragmin Pepto-Bismol   Carbenicillin Geminisyn Percodan   Carna Arthritis Reliever Geopen Persantine   Carprofen Gold's salt Persistin   Chloramphenicol Goody's Phenylbutazone   Chloromycetin  Haltrain Piroxlcam   Clmetidine heparin Plaquenil   Cllnoril Hyco-pap Ponstel   Clofibrate Hydroxy chloroquine Propoxyphen         Before stopping any of these medications, be sure to consult the physician who ordered them.  Some, such as Coumadin (Warfarin) are ordered to prevent or treat serious conditions such as "deep thrombosis", "pumonary embolisms", and other heart problems.  The amount of time that you may need off of the medication may also vary with the medication and the reason for which you were taking it.  If you are taking any of these medications, please make sure you notify your pain physician before you undergo any procedures.         Radiofrequency Lesioning Radiofrequency lesioning is a procedure that is performed to relieve pain. The procedure is often used for back, neck, or arm pain. Radiofrequency lesioning involves  the use of a machine that creates radio waves to make heat. During the procedure, the heat is applied to the nerve that carries the pain signal. The heat damages the nerve and interferes with the pain signal. Pain relief usually lasts for 6 months to 1 year. LET [facility] CARE PROVIDER KNOW ABOUT: 2. Any allergies you have. 3. All medicines you are taking, including vitamins, herbs, eye drops, creams, and over-the-counter medicines. 4. Previous problems you or members of your family have had with the use of anesthetics. 5. Any blood disorders you have. 6. Previous surgeries you have had. 7. Any medical conditions you have. 8. Whether you are pregnant or may be pregnant. RISKS AND COMPLICATIONS Generally, this is a safe procedure. However, problems may occur, including:  Pain or soreness at the injection site.  Infection at the injection site.  Damage to nerves or blood vessels. BEFORE THE PROCEDURE  Ask your health care provider about:  Changing or stopping your regular medicines. This is especially important if you are taking diabetes  medicines or blood thinners.  Taking medicines such as aspirin and ibuprofen. These medicines can thin your blood. Do not take these medicines before your procedure if your health care provider instructs you not to.  Follow instructions from your health care provider about eating or drinking restrictions.  Plan to have someone take you home after the procedure.  If you go home right after the procedure, plan to have someone with you for 24 hours. PROCEDURE  You will be given one or more of the following:  A medicine to help you relax (sedative).  A medicine to numb the area (local anesthetic).  You will be awake during the procedure. You will need to be able to talk with the health care provider during the procedure.  With the help of a type of X-ray (fluoroscopy), the health care provider will insert a radiofrequency needle into the area to be treated.  Next, a wire that carries the radio waves (electrode) will be put through the radiofrequency needle. An electrical pulse will be sent through the electrode to verify the correct nerve. You will feel a tingling sensation, and you may have muscle twitching.  Then, the tissue that is around the needle tip will be heated by an electric current that is passed using the radiofrequency machine. This will numb the nerves.  A bandage (dressing) will be put on the insertion area after the procedure is done. The procedure may vary among health care providers and hospitals. AFTER THE PROCEDURE 12. Your blood pressure, heart rate, breathing rate, and blood oxygen level will be monitored often until the medicines you were given have worn off. 13. Return to your normal activities as directed by your health care provider.   This information is not intended to replace advice given to you by your health care provider. Make sure you discuss any questions you have with your health care provider.   Document Released: 08/01/2011 Document Revised:  08/24/2015 Document Reviewed: 01/10/2015 Elsevier Interactive Patient Education [employer]

## 2015-09-21 NOTE — Progress Notes (Signed)
   Subjective:    Patient ID: [name], female    DOB: 11-Mar-1976, 39 y.o.   MRN: [id number]  HPI Patient is 39 year old female returns to [facility] for further evaluation and treatment of pain involving the lower back lower extremity region abdominal and pelvic regions. Patient has had improvement of her lower back pain with greater than 50% relief of pain following radiofrequency rhizolysis lumbar facet, medial branch nerves on the left side. Patient is of abdominal pain and pelvic pain and will follow-up with Dr. Orion Crook and her gynecologist for follow-up evaluation of her symptoms. Patient is with diagnosis of endometriosis which may be contributing to the abdominal and pelvic pain. We discussed additional treatment of patient and we will proceed with radiofrequency rhizolysis lumbar facet to be performed to complete treatment on the opposite side. The patient is in hopes of being able to undergo radiofrequency rhizolysis on the opposite side to allow patient to have more complete relief of her pain of the patient may perform activities of daily living including completing chores around her house. We will continue medications as prescribed. The patient was in agreement with suggested treatment plan      Review of Systems     Objective:   Physical Exam There was tenderness over the splenius capitis and occipitalis musculature regions of mild degree with mild tennis over the cervical facet cervical paraspinal musculature region. There was mild tenderness over the region of the thoracic facet thoracic paraspinal musculature regions. There was mild tenderness over the acromioclavicular and glenohumeral joint regions and there was unremarkable Spurling's maneuver. Tinel and Phalen's maneuver were without increased pain of significant degree. Palpation over the lumbar paraspinal musculature region lumbar facet region was with tenderness to palpation of mild to moderate degree.  Lateral bending and rotation and extension and palpation of the lumbar facets reproduced moderate discomfort. The patient was with more significant pain on the right compared to the left when palpation over the lumbar facet was performed There was tenderness over the PSIS and PII S region of moderate degree. There was mild tinnitus of the greater trochanteric region and iliotibial band region. Straight leg raising tolerated to proper 30 without increased pain with dorsiflexion noted. No sensory deficit of dermatomal distribution was detected. EHL strength appeared to be decreased slightly there was negative clonus negative Homans. Abdomen was mild lower quadrant tenderness to palpation with no costovertebral angle tenderness noted           Assessment & Plan:    Degenerative disc disease lumbar spine  Lumbar facet syndrome  Sacroiliac joint dysfunction  Endometriosis    PLAN   Continue present medication Neurontin Flexeril and hydrocodone acetaminophen  Radiofrequency rhizolysis lumbar facet, medial branch nerve to be performed at time return appointment on the right side. Patient was with greater than 50% relief of pain following radiofrequency rhizolysis lumbar facets on the left side  F/U PCP for evaliation of  BP and general medical  condition  F/U surgical evaluation. May consider pending follow-up evaluations  F/U Dr.Zolnoun as discussed   F/U neurological evaluation. May consider pending follow-up evaluations  May consider radiofrequency rhizolysis or intraspinal procedures pending response to present treatment and F/U evaluation   Patient to call Pain Management Center should patient have concerns prior to scheduled return appointment.

## 2015-10-05 ENCOUNTER — Ambulatory Visit: Payer: Medicare Other | Attending: Pain Medicine | Admitting: Pain Medicine

## 2015-10-05 ENCOUNTER — Encounter: Payer: Self-pay | Admitting: Pain Medicine

## 2015-10-05 VITALS — BP 120/77 | HR 60 | Temp 98.2°F | Resp 14

## 2015-10-05 DIAGNOSIS — N803 Endometriosis of pelvic peritoneum, unspecified: Secondary | ICD-10-CM

## 2015-10-05 DIAGNOSIS — M545 Low back pain: Secondary | ICD-10-CM | POA: Diagnosis present

## 2015-10-05 DIAGNOSIS — M533 Sacrococcygeal disorders, not elsewhere classified: Secondary | ICD-10-CM

## 2015-10-05 DIAGNOSIS — M79604 Pain in right leg: Secondary | ICD-10-CM | POA: Diagnosis present

## 2015-10-05 DIAGNOSIS — M5136 Other intervertebral disc degeneration, lumbar region: Secondary | ICD-10-CM | POA: Diagnosis not present

## 2015-10-05 DIAGNOSIS — M79605 Pain in left leg: Secondary | ICD-10-CM | POA: Diagnosis present

## 2015-10-05 DIAGNOSIS — M25559 Pain in unspecified hip: Secondary | ICD-10-CM

## 2015-10-05 DIAGNOSIS — M461 Sacroiliitis, not elsewhere classified: Secondary | ICD-10-CM

## 2015-10-05 DIAGNOSIS — M47816 Spondylosis without myelopathy or radiculopathy, lumbar region: Secondary | ICD-10-CM

## 2015-10-05 DIAGNOSIS — M47818 Spondylosis without myelopathy or radiculopathy, sacral and sacrococcygeal region: Secondary | ICD-10-CM

## 2015-10-05 MED ORDER — ORPHENADRINE CITRATE 30 MG/ML IJ SOLN: 60.0000 mg | Freq: Once | INTRAMUSCULAR | Status: DC

## 2015-10-05 MED ORDER — FENTANYL CITRATE (PF) 100 MCG/2ML IJ SOLN
100.0000 ug | Freq: Once | INTRAMUSCULAR | Status: AC
  Administered 2015-10-05: 100 ug via INTRAVENOUS

## 2015-10-05 MED ORDER — LIDOCAINE HCL (PF) 1 % IJ SOLN
INTRAMUSCULAR | Status: AC
  Filled 2015-10-05: qty 10

## 2015-10-05 MED ORDER — LIDOCAINE HCL (PF) 1 % IJ SOLN
10.0000 mL | Freq: Once | INTRAMUSCULAR | Status: AC
  Administered 2015-10-05: 08:00:00 via SUBCUTANEOUS

## 2015-10-05 MED ORDER — FENTANYL CITRATE (PF) 100 MCG/2ML IJ SOLN
INTRAMUSCULAR | Status: AC
  Administered 2015-10-05: 100 ug via INTRAVENOUS
  Filled 2015-10-05: qty 2

## 2015-10-05 MED ORDER — BUPIVACAINE HCL (PF) 0.25 % IJ SOLN: 30.0000 mL | Freq: Once | INTRAMUSCULAR | Status: DC

## 2015-10-05 MED ORDER — SODIUM CHLORIDE 0.9 % IJ SOLN: 20.0000 mL | Freq: Once | INTRAMUSCULAR | Status: DC

## 2015-10-05 MED ORDER — LACTATED RINGERS IV SOLN: 1000.0000 mL | INTRAVENOUS | Status: DC

## 2015-10-05 MED ORDER — MIDAZOLAM HCL 5 MG/5ML IJ SOLN
5.0000 mg | Freq: Once | INTRAMUSCULAR | Status: AC
  Administered 2015-10-05: 5 mg via INTRAVENOUS

## 2015-10-05 MED ORDER — MIDAZOLAM HCL 5 MG/5ML IJ SOLN
INTRAMUSCULAR | Status: AC
  Administered 2015-10-05: 5 mg via INTRAVENOUS
  Filled 2015-10-05: qty 5

## 2015-10-05 NOTE — Progress Notes (Signed)
Subjective:    Patient ID: [name], female    DOB: 05-24-1976, 39 y.o.   MRN: [id number]  HPI PROCEDURE PERFORMED: Radiofrequency rhizolysis (lunbar facet medial branch nerve radiofrequency rhizolysis).  The procedure was performed with the COOLIEF technique.  COOLIEF technique was performed using the Synergy cooled radiofrequency probe [id number]-17-150-4, the synergy cooled radiofrequency introducer SII-17-150, and the synergy cooled sterile tube kit [id number].   HISTORY: The patient is a 39 y.o. female who returns to the [facility] for further evaluation of severely disabling pain involving the lumbar and lower extremity region.  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 The patient has had significant relief of pain following previous lumbar facet, medial branch nerve, blocks. Radiofrequency rhizolysis of the lumbar facets, medial branch nerves, will be performed at this time an attempt to provide longer lasting relief of the patient's pain, minimize progression of the patient's symptoms, and avoid the need for more involved treatment. The risks, benefits and expectations of the procedure have been discussed and explained to the patient who was with understanding and wished to proceed with interventional treatment in an attempt to decrease severely  disabling pain of the lumbar and lower extremity region.  We will proceed with what is felt to be a medically necessary procedure, radiofrequency rhizolysis (lumbar facet medial branch nerve radiofrequency rhizolysis) using the COOLIEF technique.  DESCRIPTION OF PROCEDURE: COOLIEF Technique Radiofrequency Rhizolysis of Lumbar Facets (Medial Branch Nerves Radiofrequency Rhizolysis) with IV Versed, IV Fentanyl conscious sedation, EKG, blood pressure, pulse, and pulse oximetry monitoring.  The procedure was performed  with the patient in the prone position under fluoroscopic guidance.  Following Betadine prep of the proposed entry site, a 1.5% lidocaine plain skin wheal of the proposed needle entry site was  prepared in oblique orientation.   Right L3, right Radiofrequency Rhizolysis L3 Facet  (Medial Branch Nerve): Under fluoroscopic guidance, the radiofrequency needle was inserted at the L3 vertebral body level after a local anesthetic skin wheal of 1.5% lidocaine plain and Betadine prep of the proposed needle entry site was performed.  The needle was inserted under fluoroscopic guidance in the area known as Burton's eye or eye of the Scotty dog with needle placed at the superior and lateral border of the targeted area with oblique orientation utilizing the tunnel (gun  barrel approach) technique to the targeted area.  Right Radiofrequency Rhizolysis at L4 and L5  Lumbar Facets (Medial Branch Nerves) The procedure was performed at L4 and L5 exactly as was performed at the L3 and under fluoroscopic guidance utilizing the identical technique as utilized at the L3 vertebral body level.     Needle was then verified on lateral view and following needle placement verification on lateral view, radiofrequency testing was then carried out with motor testing at 2 Hz stimulation without evidence of stimulation of the lower extremity. Following radiofrequency testing for motor testing, each radiofrequency probe was then prepared with 2 mL of preservative-free lidocaine and following 1 minute of anesthetizing each proposed radiofrequency lesioning  Level, the radiofrequency probe was then inserted in the right, L5 vertebral body level needle with radiofrequency lesioning carried out for 2-1/2 minutes with temperature of the tissue being maintained at 80 degrees centigrade for 2-1/2 minutes.  Radiofrequency probe was then inserted in the right, L4 vertebral body level needle and radiofrequency lesioning was performed at 80  degrees centigrade  tissue temperature for 2-1/2 minutes.  Radiofrequency probe was then inserted in the right L3 vertebral body level needle and radiofrequency lesioning was performed at 80 degrees centigrade tissue temperature for 2-1/2 minutes  The patient tolerated the procedure well.   PLAN: 1. Medications: The patient will continue the present medications. 2. The patient will follow up with Dr. Hoy Morn regarding blood pressure and general medical condition as discussed with the patient.  3. Surgical evaluation as discussed. 4. Neurological evaluation as discussed and follow-up evaluation with Dr.Zolnoun as planned   5. The patient has been advised to call the Pain Management Center prior to scheduled return appointment should there be significant change in the patient's condition or should the patient have other concerns regarding condition prior to scheduled return appointment.  The patient is with understanding and is in agreement with suggested treatment plan.     Review of Systems     Objective:   Physical Exam        Assessment & Plan:

## 2015-10-05 NOTE — Progress Notes (Signed)
Safety precautions to be maintained throughout the outpatient stay will include: orient to surroundings, keep bed in low position, maintain call bell within reach at all times, provide assistance with transfer out of bed and ambulation.  

## 2015-10-05 NOTE — Patient Instructions (Addendum)
PLAN   Continue present medication Neurontin and hydrocodone acetaminophen  F/U PCP for evaliation of  BP and general medical  condition  F/U surgical evaluation. May consider pending follow-up evaluations  F/U neurological evaluation. May consider pending follow-up evaluations  May consider radiofrequency rhizolysis or intraspinal procedures pending response to present treatment and F/U evaluation   Patient to call Pain Management Center should patient have concerns prior to scheduled return appointment.  Pain Management Discharge Instructions  General Discharge Instructions :  If you need to reach your doctor call: Monday-Friday 8:00 am - 4:00 pm at [phone] or toll free [phone].  After clinic hours [phone] to have operator reach doctor.  Bring all of your medication bottles to all your appointments in the pain clinic.  To cancel or reschedule your appointment with Pain Management please remember to call 24 hours in advance to avoid a fee.  Refer to the educational materials which you have been given on: General Risks, I had my Procedure. Discharge Instructions, Post Sedation.  Post Procedure Instructions:  The drugs you were given will stay in your system until tomorrow, so for the next 24 hours you should not drive, make any legal decisions or drink any alcoholic beverages.  You may eat anything you prefer, but it is better to start with liquids then soups and crackers, and gradually work up to solid foods.  Please notify your doctor immediately if you have any unusual bleeding, trouble breathing or pain that is not related to your normal pain.  Depending on the type of procedure that was done, some parts of your body may feel week and/or numb.  This usually clears up by tonight or the next day.  Walk with the use of an assistive device or accompanied by an adult for the 24 hours.  You may use ice on the affected area for the first 24 hours.  Put ice in a  Ziploc bag and cover with a towel and place against area 15 minutes on 15 minutes off.  You may switch to heat after 24 hours.

## 2015-10-06 ENCOUNTER — Telehealth: Payer: Self-pay | Admitting: *Deleted

## 2015-10-06 NOTE — Telephone Encounter (Signed)
Message left

## 2015-10-20 ENCOUNTER — Ambulatory Visit: Payer: Medicare Other | Attending: Pain Medicine | Admitting: Pain Medicine

## 2015-10-20 ENCOUNTER — Encounter: Payer: Self-pay | Admitting: Pain Medicine

## 2015-10-20 VITALS — BP 102/57 | HR 69 | Temp 99.0°F | Resp 15 | Ht 64.0 in | Wt 255.0 lb

## 2015-10-20 DIAGNOSIS — M79605 Pain in left leg: Secondary | ICD-10-CM | POA: Diagnosis present

## 2015-10-20 DIAGNOSIS — N809 Endometriosis, unspecified: Secondary | ICD-10-CM | POA: Diagnosis not present

## 2015-10-20 DIAGNOSIS — N803 Endometriosis of pelvic peritoneum, unspecified: Secondary | ICD-10-CM

## 2015-10-20 DIAGNOSIS — M47818 Spondylosis without myelopathy or radiculopathy, sacral and sacrococcygeal region: Secondary | ICD-10-CM

## 2015-10-20 DIAGNOSIS — M461 Sacroiliitis, not elsewhere classified: Secondary | ICD-10-CM

## 2015-10-20 DIAGNOSIS — M5136 Other intervertebral disc degeneration, lumbar region: Secondary | ICD-10-CM | POA: Insufficient documentation

## 2015-10-20 DIAGNOSIS — M545 Low back pain: Secondary | ICD-10-CM | POA: Diagnosis present

## 2015-10-20 DIAGNOSIS — M47816 Spondylosis without myelopathy or radiculopathy, lumbar region: Secondary | ICD-10-CM

## 2015-10-20 DIAGNOSIS — M533 Sacrococcygeal disorders, not elsewhere classified: Secondary | ICD-10-CM

## 2015-10-20 DIAGNOSIS — M79604 Pain in right leg: Secondary | ICD-10-CM | POA: Diagnosis present

## 2015-10-20 DIAGNOSIS — M25559 Pain in unspecified hip: Secondary | ICD-10-CM

## 2015-10-20 MED ORDER — HYDROCODONE-ACETAMINOPHEN 7.5-325 MG/15ML PO SOLN: ORAL | Status: DC

## 2015-10-20 NOTE — Progress Notes (Signed)
   Subjective:    Patient ID: [name], female    DOB: 12/20/1975, 39 y.o.   MRN: [id number]  HPI   The patient is a 39 year old female who returns to pain management Center for further evaluation and treatment of pain involving the lower back and lower extremity region with some mid back pain as well of lesser degree. Patient had improvement of pain following prior interventional treatment and pain medicine Center. Patient admits to pain occurring the region of the buttocks the pain becoming more intense with standing and walking. Patient denies any trauma and states that pain persists despite his present medications. We will proceed with block of the nerves to the sacroiliac joint at time of return appointment in attempt to decrease severity of symptoms, minimize progression of symptoms and avoid the need for more involved treatment. The patient was in agreement with suggested treatment plan.      Review of Systems     Objective:   Physical Exam   Palpation of the splenius capitis of the talus muscles reproduce mild discomfort. There was mild tenderness of the right, equivocal and glenohumeral joint region and patient appeared to be bilaterally equal grip strength. Tinel and Phalen's maneuvers were without increased pain of significant degree. There appeared to be unremarkable Spurling's maneuver. There was tenderness over the thoracic facet thoracic paraspinal musculature region with muscle spasms of the lower thoracic region a moderate degree. Palpation over the lumbar paraspinal musculature and the lumbosacral region was tender to palpation a moderate degree. There was moderate to moderately severe tenderness to palpation of the PSIS and PSIS regions. There was increase of pain with pressure applied to ileum with patient in lateral decubitus position with palpation of the PSIS and PSIS regions reproduces severe pain. Straight leg  raising was tolerated to approximately 20 without  increased pain with dorsiflexion noted. He appeared to be negative clonus negative Homans. No definite sensory deficit or dermatomal description was detected. There was mild tenderness of the greater trochanteric region and iliotibial band region  Abdomen was nontender and no costovertebral angle tenderness was noted    Assessment & Plan:      Degenerative disc disease lumbar spine  Lumbar facet syndrome  Sacroiliac joint dysfunction  Endometriosis     PLAN    Continue present medication Neurontin Flexeril and hydrocodone acetaminophen  Block of nerves to the sacroiliac joint to be performed at time of return appointment  F/U PCP Dr. Gavin PottersGrandis for evaliation of  BP and general medical  condition  F/U surgical evaluation. May consider pending follow-up evaluations  F/U Dr.Zolnoun as discussed   F/U neurological evaluation. May consider pending follow-up evaluations  May consider radiofrequency rhizolysis or intraspinal procedures pending response to present treatment and F/U evaluation

## 2015-10-20 NOTE — Patient Instructions (Addendum)
PLAN   Continue present medication Neurontin Flexeril and hydrocodone acetaminophen  Block of nerves to sacroiliac joint to be performed next appointment  F/U PCP for evaliation of  BP and general medical  condition  F/U surgical evaluation. May consider pending follow-up evaluations  F/U neurological evaluation. May consider pending follow-up evaluations  F/U Dr Orion CrookZolnoun as planned  May consider radiofrequency rhizolysis or intraspinal procedures pending response to present treatment and F/U evaluation   Patient to call Pain Management Center should patient have concerns prior to scheduled return appointment.Sacroiliac (SI) Joint Injection Patient Information  Description: The sacroiliac joint connects the scrum (very low back and tailbone) to the ilium (a pelvic bone which also forms half of the hip joint).  Normally this joint experiences very little motion.  When this joint becomes inflamed or unstable low back and or hip and pelvis pain may result.  Injection of this joint with local anesthetics (numbing medicines) and steroids can provide diagnostic information and reduce pain.  This injection is performed with the aid of x-ray guidance into the tailbone area while you are lying on your stomach.   You may experience an electrical sensation down the leg while this is being done.  You may also experience numbness.  We also may ask if we are reproducing your normal pain during the injection.  Conditions which may be treated SI injection:   Low back, buttock, hip or leg pain  Preparation for the Injection:  1. Do not eat any solid food or dairy products within 6 hours of your appointment.  2. You may drink clear liquids up to 2 hours before appointment.  Clear liquids include water, black coffee, juice or soda.  No milk or cream please. 3. You may take your regular medications, including pain medications with a sip of water before your appointment.  Diabetics should hold regular  insulin (if take separately) and take 1/2 normal NPH dose the morning of the procedure.  Carry some sugar containing items with you to your appointment. 4. A driver must accompany you and be prepared to drive you home after your procedure. 5. Bring all of your current medications with you. 6. An IV may be inserted and sedation may be given at the discretion of the physician. 7. A blood pressure cuff, EKG and other monitors will often be applied during the procedure.  Some patients may need to have extra oxygen administered for a short period.  8. You will be asked to provide medical information, including your allergies, prior to the procedure.  We must know immediately if you are taking blood thinners (like Coumadin/Warfarin) or if you are allergic to IV iodine contrast (dye).  We must know if you could possible be pregnant.  Possible side effects:   Bleeding from needle site  Infection (rare, may require surgery)  Nerve injury (rare)  Numbness & tingling (temporary)  A brief convulsion or seizure  Light-headedness (temporary)  Pain at injection site (several days)  Decreased blood pressure (temporary)  Weakness in the leg (temporary)   Call if you experience:   New onset weakness or numbness of an extremity below the injection site that last more than 8 hours.  Hives or difficulty breathing ( go to the emergency room)  Inflammation or drainage at the injection site  Any new symptoms which are concerning to you  Please note:  Although the local anesthetic injected can often make your back/ hip/ buttock/ leg feel good for several hours after the injections,  the pain will likely return.  It takes 3-7 days for steroids to work in the sacroiliac area.  You may not notice any pain relief for at least that one week.  If effective, we will often do a series of three injections spaced 3-6 weeks apart to maximally decrease your pain.  After the initial series, we generally will  wait some months before a repeat injection of the same type.  If you have any questions, please call [phone] [facility]  GENERAL RISKS AND COMPLICATIONS  What are the risk, side effects and possible complications? Generally speaking, most procedures are safe.  However, with any procedure there are risks, side effects, and the possibility of complications.  The risks and complications are dependent upon the sites that are lesioned, or the type of nerve block to be performed.  The closer the procedure is to the spine, the more serious the risks are.  Great care is taken when placing the radio frequency needles, block needles or lesioning probes, but sometimes complications can occur. 1. Infection: Any time there is an injection through the skin, there is a risk of infection.  This is why sterile conditions are used for these blocks.  There are four possible types of infection. 1. Localized skin infection. 2. Central Nervous System Infection-This can be in the form of Meningitis, which can be deadly. 3. Epidural Infections-This can be in the form of an epidural abscess, which can cause pressure inside of the spine, causing compression of the spinal cord with subsequent paralysis. This would require an emergency surgery to decompress, and there are no guarantees that the patient would recover from the paralysis. 4. Discitis-This is an infection of the intervertebral discs.  It occurs in about 1% of discography procedures.  It is difficult to treat and it may lead to surgery.        2. Pain: the needles have to go through skin and soft tissues, will cause soreness.       3. Damage to internal structures:  The nerves to be lesioned may be near blood vessels or    other nerves which can be potentially damaged.       4. Bleeding: Bleeding is more common if the patient is taking blood thinners such as  aspirin, Coumadin, Ticiid, Plavix, etc., or if he/she have some  genetic predisposition  such as hemophilia. Bleeding into the spinal canal can cause compression of the spinal  cord with subsequent paralysis.  This would require an emergency surgery to  decompress and there are no guarantees that the patient would recover from the  paralysis.       5. Pneumothorax:  Puncturing of a lung is a possibility, every time a needle is introduced in  the area of the chest or upper back.  Pneumothorax refers to free air around the  collapsed lung(s), inside of the thoracic cavity (chest cavity).  Another two possible  complications related to a similar event would include: Hemothorax and Chylothorax.   These are variations of the Pneumothorax, where instead of air around the collapsed  lung(s), you may have blood or chyle, respectively.       6. Spinal headaches: They may occur with any procedures in the area of the spine.       7. Persistent CSF (Cerebro-Spinal Fluid) leakage: This is a rare problem, but may occur  with prolonged intrathecal or epidural catheters either due to the formation of a fistulous  track or a dural tear.       8. Nerve damage: By working so close to the spinal cord, there is always a possibility of  nerve damage, which could be as serious as a permanent spinal cord injury with  paralysis.       9. Death:  Although rare, severe deadly allergic reactions known as "Anaphylactic  reaction" can occur to any of the medications used.      10. Worsening of the symptoms:  We can always make thing worse.  What are the chances of something like this happening? Chances of any of this occuring are extremely low.  By statistics, you have more of a chance of getting killed in a motor vehicle accident: while driving to the hospital than any of the above occurring .  Nevertheless, you should be aware that they are possibilities.  In general, it is similar to taking a shower.  Everybody knows that you can slip, hit your head and get killed.  Does that mean that you should  not shower again?  Nevertheless always keep in mind that statistics do not mean anything if you happen to be on the wrong side of them.  Even if a procedure has a 1 (one) in a 1,000,000 (million) chance of going wrong, it you happen to be that one..Also, keep in mind that by statistics, you have more of a chance of having something go wrong when taking medications.  Who should not have this procedure? If you are on a blood thinning medication (e.g. Coumadin, Plavix, see list of "Blood Thinners"), or if you have an active infection going on, you should not have the procedure.  If you are taking any blood thinners, please inform your physician.  How should I prepare for this procedure?  Do not eat or drink anything at least six hours prior to the procedure.  Bring a driver with you .  It cannot be a taxi.  Come accompanied by an adult that can drive you back, and that is strong enough to help you if your legs get weak or numb from the local anesthetic.  Take all of your medicines the morning of the procedure with just enough water to swallow them.  If you have diabetes, make sure that you are scheduled to have your procedure done first thing in the morning, whenever possible.  If you have diabetes, take only half of your insulin dose and notify our nurse that you have done so as soon as you arrive at the clinic.  If you are diabetic, but only take blood sugar pills (oral hypoglycemic), then do not take them on the morning of your procedure.  You may take them after you have had the procedure.  Do not take aspirin or any aspirin-containing medications, at least eleven (11) days prior to the procedure.  They may prolong bleeding.  Wear loose fitting clothing that may be easy to take off and that you would not mind if it got stained with Betadine or blood.  Do not wear any jewelry or perfume  Remove any nail coloring.  It will interfere with some of our monitoring equipment.  NOTE: Remember  that this is not meant to be interpreted as a complete list of all possible complications.  Unforeseen problems may occur.  BLOOD THINNERS The following drugs contain aspirin or other products, which can cause increased bleeding during surgery and should not be taken for 2 weeks prior to and 1 week after surgery.  If you should need take something for relief of minor pain, you may take acetaminophen which is found in Tylenol,m Datril, Anacin-3 and Panadol. It is not blood thinner. The products listed below are.  Do not take any of the products listed below in addition to any listed on your instruction sheet.  A.P.C or A.P.C with Codeine Codeine Phosphate Capsules #3 Ibuprofen Ridaura  ABC compound Congesprin Imuran rimadil  Advil Cope Indocin Robaxisal  Alka-Seltzer Effervescent Pain Reliever and Antacid Coricidin or Coricidin-D  Indomethacin Rufen  Alka-Seltzer plus Cold Medicine Cosprin Ketoprofen S-A-C Tablets  Anacin Analgesic Tablets or Capsules Coumadin Korlgesic Salflex  Anacin Extra Strength Analgesic tablets or capsules CP-2 Tablets Lanoril Salicylate  Anaprox Cuprimine Capsules Levenox Salocol  Anexsia-D Dalteparin Magan Salsalate  Anodynos Darvon compound Magnesium Salicylate Sine-off  Ansaid Dasin Capsules Magsal Sodium Salicylate  Anturane Depen Capsules Marnal Soma  APF Arthritis pain formula Dewitt's Pills Measurin Stanback  Argesic Dia-Gesic Meclofenamic Sulfinpyrazone  Arthritis Bayer Timed Release Aspirin Diclofenac Meclomen Sulindac  Arthritis pain formula Anacin Dicumarol Medipren Supac  Analgesic (Safety coated) Arthralgen Diffunasal Mefanamic Suprofen  Arthritis Strength Bufferin Dihydrocodeine Mepro Compound Suprol  Arthropan liquid Dopirydamole Methcarbomol with Aspirin Synalgos  ASA tablets/Enseals Disalcid Micrainin Tagament  Ascriptin Doan's Midol Talwin  Ascriptin A/D Dolene Mobidin Tanderil  Ascriptin Extra Strength Dolobid Moblgesic Ticlid  Ascriptin with  Codeine Doloprin or Doloprin with Codeine Momentum Tolectin  Asperbuf Duoprin Mono-gesic Trendar  Aspergum Duradyne Motrin or Motrin IB Triminicin  Aspirin plain, buffered or enteric coated Durasal Myochrisine Trigesic  Aspirin Suppositories Easprin Nalfon Trillsate  Aspirin with Codeine Ecotrin Regular or Extra Strength Naprosyn Uracel  Atromid-S Efficin Naproxen Ursinus  Auranofin Capsules Elmiron Neocylate Vanquish  Axotal Emagrin Norgesic Verin  Azathioprine Empirin or Empirin with Codeine Normiflo Vitamin E  Azolid Emprazil Nuprin Voltaren  Bayer Aspirin plain, buffered or children's or timed BC Tablets or powders Encaprin Orgaran Warfarin Sodium  Buff-a-Comp Enoxaparin Orudis Zorpin  Buff-a-Comp with Codeine Equegesic Os-Cal-Gesic   Buffaprin Excedrin plain, buffered or Extra Strength Oxalid   Bufferin Arthritis Strength Feldene Oxphenbutazone   Bufferin plain or Extra Strength Feldene Capsules Oxycodone with Aspirin   Bufferin with Codeine Fenoprofen Fenoprofen Pabalate or Pabalate-SF   Buffets II Flogesic Panagesic   Buffinol plain or Extra Strength Florinal or Florinal with Codeine Panwarfarin   Buf-Tabs Flurbiprofen Penicillamine   Butalbital Compound Four-way cold tablets Penicillin   Butazolidin Fragmin Pepto-Bismol   Carbenicillin Geminisyn Percodan   Carna Arthritis Reliever Geopen Persantine   Carprofen Gold's salt Persistin   Chloramphenicol Goody's Phenylbutazone   Chloromycetin Haltrain Piroxlcam   Clmetidine heparin Plaquenil   Cllnoril Hyco-pap Ponstel   Clofibrate Hydroxy chloroquine Propoxyphen         Before stopping any of these medications, be sure to consult the physician who ordered them.  Some, such as Coumadin (Warfarin) are ordered to prevent or treat serious conditions such as "deep thrombosis", "pumonary embolisms", and other heart problems.  The amount of time that you may need off of the medication may also vary with the medication and the reason for  which you were taking it.  If you are taking any of these medications, please make sure you notify your pain physician before you undergo any procedures.

## 2015-10-20 NOTE — Progress Notes (Signed)
Safety precautions to be maintained throughout the outpatient stay will include: orient to surroundings, keep bed in low position, maintain call bell within reach at all times, provide assistance with transfer out of bed and ambulation.  

## 2015-10-26 ENCOUNTER — Ambulatory Visit: Payer: Medicare Other | Attending: Pain Medicine | Admitting: Pain Medicine

## 2015-10-26 ENCOUNTER — Encounter: Payer: Self-pay | Admitting: Pain Medicine

## 2015-10-26 VITALS — BP 96/55 | HR 61 | Temp 98.7°F | Resp 18 | Ht 64.0 in | Wt 255.0 lb

## 2015-10-26 DIAGNOSIS — M545 Low back pain: Secondary | ICD-10-CM | POA: Diagnosis present

## 2015-10-26 DIAGNOSIS — N803 Endometriosis of pelvic peritoneum, unspecified: Secondary | ICD-10-CM

## 2015-10-26 DIAGNOSIS — M47818 Spondylosis without myelopathy or radiculopathy, sacral and sacrococcygeal region: Secondary | ICD-10-CM

## 2015-10-26 DIAGNOSIS — M461 Sacroiliitis, not elsewhere classified: Secondary | ICD-10-CM

## 2015-10-26 DIAGNOSIS — M25559 Pain in unspecified hip: Secondary | ICD-10-CM

## 2015-10-26 DIAGNOSIS — M533 Sacrococcygeal disorders, not elsewhere classified: Secondary | ICD-10-CM

## 2015-10-26 DIAGNOSIS — M79605 Pain in left leg: Secondary | ICD-10-CM | POA: Diagnosis present

## 2015-10-26 DIAGNOSIS — M79604 Pain in right leg: Secondary | ICD-10-CM | POA: Diagnosis present

## 2015-10-26 DIAGNOSIS — M5136 Other intervertebral disc degeneration, lumbar region: Secondary | ICD-10-CM

## 2015-10-26 DIAGNOSIS — M47816 Spondylosis without myelopathy or radiculopathy, lumbar region: Secondary | ICD-10-CM

## 2015-10-26 MED ORDER — TRIAMCINOLONE ACETONIDE 40 MG/ML IJ SUSP
INTRAMUSCULAR | Status: AC
  Filled 2015-10-26: qty 1

## 2015-10-26 MED ORDER — ORPHENADRINE CITRATE 30 MG/ML IJ SOLN
60.0000 mg | Freq: Once | INTRAMUSCULAR | Status: AC
  Administered 2015-10-26: 09:00:00 via INTRAMUSCULAR

## 2015-10-26 MED ORDER — BUPIVACAINE HCL (PF) 0.25 % IJ SOLN
30.0000 mL | Freq: Once | INTRAMUSCULAR | Status: AC
  Administered 2015-10-26: 09:00:00

## 2015-10-26 MED ORDER — ORPHENADRINE CITRATE 30 MG/ML IJ SOLN
INTRAMUSCULAR | Status: AC
  Filled 2015-10-26: qty 2

## 2015-10-26 MED ORDER — FENTANYL CITRATE (PF) 100 MCG/2ML IJ SOLN
INTRAMUSCULAR | Status: AC
  Administered 2015-10-26: 100 ug via INTRAVENOUS
  Filled 2015-10-26: qty 2

## 2015-10-26 MED ORDER — LACTATED RINGERS IV SOLN
1000.0000 mL | INTRAVENOUS | Status: DC
Start: 2015-10-26 — End: 2019-01-27

## 2015-10-26 MED ORDER — TRIAMCINOLONE ACETONIDE 40 MG/ML IJ SUSP
40.0000 mg | Freq: Once | INTRAMUSCULAR | Status: AC
  Administered 2015-10-26: 09:00:00

## 2015-10-26 MED ORDER — MIDAZOLAM HCL 5 MG/5ML IJ SOLN
INTRAMUSCULAR | Status: AC
  Administered 2015-10-26: 5 mg via INTRAVENOUS
  Filled 2015-10-26: qty 5

## 2015-10-26 MED ORDER — BUPIVACAINE HCL (PF) 0.25 % IJ SOLN
INTRAMUSCULAR | Status: AC
  Filled 2015-10-26: qty 30

## 2015-10-26 MED ORDER — FENTANYL CITRATE (PF) 100 MCG/2ML IJ SOLN
100.0000 ug | Freq: Once | INTRAMUSCULAR | Status: AC
  Administered 2015-10-26: 100 ug via INTRAVENOUS

## 2015-10-26 NOTE — Progress Notes (Signed)
Subjective:    Patient ID: Zachery DakinsShannon D Murin, female    DOB: 10/19/1976, 39 y.o.   MRN: [id number]  HPI PROCEDURE:  Block of nerves to the sacroiliac joint.   NOTE:  The patient is a 39 y.o. female who returns to the [facility] for further evaluation and treatment of pain involving the lower back and lower extremity region with pain in the region of the buttocks as well.The patient is in severe pain with palpation of the PSIS and PI IS regions and is a positive Patrick's maneuver. There is concern regarding significant component of patient's pain being due to sacroiliac joint dysfunction  The risks, benefits, expectations of the procedure have been discussed and explained to the patient who is understanding and willing to proceed with interventional treatment in attempt to decrease severity of patient's symptoms, minimize the risk of medication escalation and  hopefully retard the progression of the patient's symptoms. We will proceed with what is felt to be a medically necessary procedure, block of nerves to the sacroiliac joint.   DESCRIPTION OF PROCEDURE:  Block of nerves to the sacroiliac joint.   The patient was taken to the fluoroscopy suite. With the patient in the prone position with EKG, blood pressure, pulse and pulse oximetry monitoring, IV Versed, IV fentanyl conscious sedation, Betadine prep of proposed entry site was performed.   Block of nerves at the L5 vertebral body level.   With the patient in prone position, under fluoroscopic guidance, a 22 -gauge needle was inserted at the L5 vertebral body level on the   side. With 15 degrees oblique orientation a 22 -gauge needle was inserted in the region known as Burton's eye or eye of the Scotty dog. Following documentation of needle placement in the area of Burton's eye or eye of the Scotty dog under fluoroscopic guidance, needle placement was then accomplished at the sacral ala level on the left side.   Needle placement at  the sacral ala.   With the patient in prone position under fluoroscopic guidance with AP view of the lumbosacral spine, a 22 -gauge needle was inserted in the region known as the sacral ala on the left side. Following documentation of needle placement on the left side under fluoroscopic guidance needle placement was then accomplished at the S1 foramen level.   Needle placement at the S1 foramen level.   With the patient in prone position under fluoroscopic guidance with AP view of the lumbosacral spine and cephalad orientation, a 22 -gauge needle was inserted at the superior and lateral border of the S1 foramen on the left side. Following documentation of needle placement at the S1 foramen level on the left side, needle placement was then accomplished at the S2 foramen level on the left side.   Needle placement at the S2 foramen level.   With the patient in prone position with AP view of the lumbosacral spine with cephalad orientation, a 22 - gauge needle was inserted at the superior and lateral border of the S2 foramen under fluoroscopic guidance on the left side. Following needle placement at the L5 vertebral body level, sacral ala, S1 foramen and S2 foramen on the left side, needle placement was verified on lateral view under fluoroscopic guidance.  Following needle placement documentation on lateral view, each needle was injected with 1 mL of 0.25% bupivacaine and Kenalog.   BLOCK OF THE NERVES TO SACROILIAC JOINT ON THE RIGHT SIDE The procedure was performed on the right side at the  same levels as was performed on the left side and utilizing the same technique as on the left side and was performed under fluoroscopic guidance as on the left side   Myoneural block injections of the cervical region Following Betadine prep of proposed entry site a 22-gauge needle was inserted in the cervical para spinal musculature region and following negative aspiration 2 cc of 0.25% bupivacainewas injected from  our nerve block injection of the cervical region 2   The patient tolerated the procedure well   A total of  of Kenalog was utilized for the procedure.   PLAN:  1. Medications: The patient will continue presently prescribed medication Neurontin and hydrocodone acetaminophen  2. The patient will be considered for modification of treatment regimen pending response to the procedure performed on today's visit.  3. The patient is to follow-up with primary care physician Dr. Gavin Potters for evaluation of blood pressure and general medical condition following the procedure performed on today's visit.  4. Surgical evaluation as discussed. Follow-up with Dr.Zolnoun 5. Neurological evaluation as discussed.  6. The patient may be a candidate for radiofrequency procedures, implantation devices and other treatment pending response to treatment performed on today's visit and follow-up evaluation.  7. The patient has been advised to adhere to proper body mechanics and to avoid activities which may exacerbate the patient's symptoms.   Return appointment to [facility] as scheduled.      Review of Systems     Objective:   Physical Exam        Assessment & Plan:

## 2015-10-26 NOTE — Patient Instructions (Addendum)
PLAN   Continue present medication Neurontin Flexeril and hydrocodone acetaminophen  F/U PCP for evaliation of  BP and general medical  condition  F/U surgical evaluation. May consider pending follow-up evaluations  F/U neurological evaluation. May consider pending follow-up evaluations  F/U Dr Orion Crook as planned  May consider radiofrequency rhizolysis or intraspinal procedures pending response to present treatment and F/U evaluation   Patient to call Pain Management Center should patient have concerns prior to scheduled return appointment.Sacroiliac (SI) Joint Injection Patient Information  Description: The sacroiliac joint connects the scrum (very low back and tailbone) to the ilium (a pelvic bone which also forms half of the hip joint).  Normally this joint experiences very little motion.  When this joint becomes inflamed or unstable low back and or hip and pelvis pain may result.  Injection of this joint with local anesthetics (numbing medicines) and steroids can provide diagnostic information and reduce pain.  This injection is performed with the aid of x-ray guidance into the tailbone area while you are lying on your stomach.   You may experience an electrical sensation down the leg while this is being done.  You may also experience numbness.  We also may ask if we are reproducing your normal pain during the injection.  Conditions which may be treated SI injection:   Low back, buttock, hip or leg pain  Preparation for the Injection:  1. Do not eat any solid food or dairy products within 6 hours of your appointment.  2. You may drink clear liquids up to 2 hours before appointment.  Clear liquids include water, black coffee, juice or soda.  No milk or cream please. 3. You may take your regular medications, including pain medications with a sip of water before your appointment.  Diabetics should hold regular insulin (if take separately) and take 1/2 normal NPH dose the morning of the  procedure.  Carry some sugar containing items with you to your appointment. 4. A driver must accompany you and be prepared to drive you home after your procedure. 5. Bring all of your current medications with you. 6. An IV may be inserted and sedation may be given at the discretion of the physician. 7. A blood pressure cuff, EKG and other monitors will often be applied during the procedure.  Some patients may need to have extra oxygen administered for a short period.  8. You will be asked to provide medical information, including your allergies, prior to the procedure.  We must know immediately if you are taking blood thinners (like Coumadin/Warfarin) or if you are allergic to IV iodine contrast (dye).  We must know if you could possible be pregnant.  Possible side effects:   Bleeding from needle site  Infection (rare, may require surgery)  Nerve injury (rare)  Numbness & tingling (temporary)  A brief convulsion or seizure  Light-headedness (temporary)  Pain at injection site (several days)  Decreased blood pressure (temporary)  Weakness in the leg (temporary)   Call if you experience:   New onset weakness or numbness of an extremity below the injection site that last more than 8 hours.  Hives or difficulty breathing ( go to the emergency room)  Inflammation or drainage at the injection site  Any new symptoms which are concerning to you  Please note:  Although the local anesthetic injected can often make your back/ hip/ buttock/ leg feel good for several hours after the injections, the pain will likely return.  It takes 3-7 days for steroids  to work in the sacroiliac area.  You may not notice any pain relief for at least that one week.  If effective, we will often do a series of three injections spaced 3-6 weeks apart to maximally decrease your pain.  After the initial series, we generally will wait some months before a repeat injection of the same type.  If you have any  questions, please call [phone] [facility]  Pain Management Discharge Instructions  General Discharge Instructions :  If you need to reach your doctor call: Monday-Friday 8:00 am - 4:00 pm at [phone] or toll free [phone].  After clinic hours [phone] to have operator reach doctor.  Bring all of your medication bottles to all your appointments in the pain clinic.  To cancel or reschedule your appointment with Pain Management please remember to call 24 hours in advance to avoid a fee.  Refer to the educational materials which you have been given on: General Risks, I had my Procedure. Discharge Instructions, Post Sedation.  Post Procedure Instructions:  The drugs you were given will stay in your system until tomorrow, so for the next 24 hours you should not drive, make any legal decisions or drink any alcoholic beverages.  You may eat anything you prefer, but it is better to start with liquids then soups and crackers, and gradually work up to solid foods.  Please notify your doctor immediately if you have any unusual bleeding, trouble breathing or pain that is not related to your normal pain.  Depending on the type of procedure that was done, some parts of your body may feel week and/or numb.  This usually clears up by tonight or the next day.  Walk with the use of an assistive device or accompanied by an adult for the 24 hours.  You may use ice on the affected area for the first 24 hours.  Put ice in a Ziploc bag and cover with a towel and place against area 15 minutes on 15 minutes off.  You may switch to heat after 24 hours.

## 2015-10-26 NOTE — Progress Notes (Signed)
Safety precautions to be maintained throughout the outpatient stay will include: orient to surroundings, keep bed in low position, maintain call bell within reach at all times, provide assistance with transfer out of bed and ambulation.  

## 2015-10-27 ENCOUNTER — Telehealth: Payer: Self-pay | Admitting: *Deleted

## 2015-10-27 NOTE — Telephone Encounter (Signed)
Patient verbalizes no problems from procedure.

## 2015-11-22 ENCOUNTER — Ambulatory Visit: Payer: Medicare Other | Attending: Pain Medicine | Admitting: Pain Medicine

## 2015-11-22 ENCOUNTER — Encounter: Payer: Self-pay | Admitting: Pain Medicine

## 2015-11-22 VITALS — BP 95/41 | HR 60 | Temp 97.6°F | Resp 16 | Ht 64.0 in | Wt 250.0 lb

## 2015-11-22 DIAGNOSIS — N809 Endometriosis, unspecified: Secondary | ICD-10-CM | POA: Diagnosis not present

## 2015-11-22 DIAGNOSIS — M5136 Other intervertebral disc degeneration, lumbar region: Secondary | ICD-10-CM | POA: Insufficient documentation

## 2015-11-22 DIAGNOSIS — M47816 Spondylosis without myelopathy or radiculopathy, lumbar region: Secondary | ICD-10-CM

## 2015-11-22 DIAGNOSIS — M25559 Pain in unspecified hip: Secondary | ICD-10-CM

## 2015-11-22 DIAGNOSIS — M461 Sacroiliitis, not elsewhere classified: Secondary | ICD-10-CM

## 2015-11-22 DIAGNOSIS — M79605 Pain in left leg: Secondary | ICD-10-CM | POA: Diagnosis present

## 2015-11-22 DIAGNOSIS — N803 Endometriosis of pelvic peritoneum, unspecified: Secondary | ICD-10-CM

## 2015-11-22 DIAGNOSIS — M533 Sacrococcygeal disorders, not elsewhere classified: Secondary | ICD-10-CM | POA: Diagnosis not present

## 2015-11-22 DIAGNOSIS — M545 Low back pain: Secondary | ICD-10-CM | POA: Diagnosis present

## 2015-11-22 DIAGNOSIS — M47818 Spondylosis without myelopathy or radiculopathy, sacral and sacrococcygeal region: Secondary | ICD-10-CM

## 2015-11-22 DIAGNOSIS — M79604 Pain in right leg: Secondary | ICD-10-CM | POA: Diagnosis present

## 2015-11-22 MED ORDER — GABAPENTIN 100 MG PO CAPS: 100.0000 mg | ORAL_CAPSULE | Freq: Three times a day (TID) | ORAL | Status: DC

## 2015-11-22 MED ORDER — CYCLOBENZAPRINE HCL 10 MG PO TABS: ORAL_TABLET | ORAL | Status: DC

## 2015-11-22 MED ORDER — HYDROCODONE-ACETAMINOPHEN 7.5-325 MG/15ML PO SOLN: ORAL | Status: DC

## 2015-11-22 NOTE — Progress Notes (Signed)
   Subjective:    Patient ID: [name], female    DOB: 09/24/1976, 39 y.o.   MRN: [id number]  HPI  Patient is a 39 year old female who returns to pain management for follow-up evaluation and treatment of pain involving the lower back and lower extremity region. The patient states the pain involves the lower abdominal region as well as pelvic region and lower extremity regions. The patient is with pain that radiates from the lumbar region toward the buttocks on the left as well as on the right the pain becomes more intense with standing twisting and turning maneuvers. Patient has had difficulty performing sleeping mopping and similar activities. The patient states that she has had significant improvement following lumbar facet, medial branch nerve blocks. We will consider patient for lumbar facet, medial branch nerve blocks at time return appointment in attempt to decrease severity of symptoms, minimize progression of symptoms, and avoid need for more involved treatment.      Review of Systems     Objective:   Physical Exam  There was tenderness to palpation of the paraspinal musculature region the cervical region cervical facet region a mild degree. There was mild tenderness of the splenius capitate and occipitalis regions. There appeared to be unremarkable Spurling's maneuver palpation over the thoracic facet thoracic paraspinal musculature region was attends to palpation of mild to moderate degree with no crepitus of the thoracic region noted. Palpation over the lumbar paraspinal musculatures and lumbar facet region was attends to palpation of moderately severe degree with lateral bending rotation extension and palpation of the lumbar facets reproducing moderate to moderately severe discomfort. There was tenderness to palpation over the PSIS and PII S region a moderate degree there was mild to moderate increase of pain with Patrick's maneuver. Straight leg raising was tolerates  approximately 20 without increased pain with dorsiflexion noted. There was mild tenderness to palpation over the greater trochanteric region and iliotibial band region. No sensory deficit or dermatomal distribution detected. Negative clonus negative Homans. Abdomen was with mild lower quadrant abdominal tenderness to palpation without rebound tenderness fluid wave or shifting dullness. No costovertebral tenderness was noted      Assessment & Plan:    Degenerative disc disease lumbar spine  Lumbar facet syndrome  Sacroiliac joint dysfunction  Endometriosis     PLAN    Continue present medication Neurontin Flexeril and hydrocodone acetaminophen  Lumbar facet, U branch nerve block to be performed at time return appoint  F/U PCP for evaliation of  BP and general medical  condition  F/U surgical evaluation. May consider pending follow-up evaluations  F/U neurological evaluation. May consider pending follow-up evaluations  F/U Dr Orion CrookZolnoun as planned  May consider radiofrequency rhizolysis or intraspinal procedures pending response to present treatment and F/U evaluation   Patient to call [facility] Management Center should patient have concerns prior to scheduled return appointment.

## 2015-11-22 NOTE — Progress Notes (Signed)
Safety precautions to be maintained throughout the outpatient stay will include: orient to surroundings, keep bed in low position, maintain call bell within reach at all times, provide assistance with transfer out of bed and ambulation.  

## 2015-11-22 NOTE — Patient Instructions (Addendum)
PLAN   Continue present medication Neurontin Flexeril and hydrocodone acetaminophen  Lumbar facet, U branch nerve block to be performed at time return appoint  F/U PCP for evaliation of  BP and general medical  condition  F/U surgical evaluation. May consider pending follow-up evaluations  F/U neurological evaluation. May consider pending follow-up evaluations  F/U Dr Orion CrookZolnoun as planned  May consider radiofrequency rhizolysis or intraspinal procedures pending response to present treatment and F/U evaluation   Patient to call [facility] Center should patient have concerns prior to scheduled return appointment.  Facet Joint Block The facet joints connect the bones of the spine (vertebrae). They make it possible for you to bend, twist, and make other movements with your spine. They also prevent you from overbending, overtwisting, and making other excessive movements.  A facet joint block is a procedure where a numbing medicine (anesthetic) is injected into a facet joint. Often, a type of anti-inflammatory medicine called a steroid is also injected. A facet joint block may be done for two reasons:   Diagnosis. A facet joint block may be done as a test to see whether neck or back pain is caused by a worn-down or infected facet joint. If the pain gets better after a facet joint block, it means the pain is probably coming from the facet joint. If the pain does not get better, it means the pain is probably not coming from the facet joint.   Therapy. A facet joint block may be done to relieve neck or back pain caused by a facet joint. A facet joint block is only done as a therapy if the pain does not improve with medicine, exercise programs, physical therapy, and other forms of pain management. LET [facility]OUR [facility]ALTH CARE PROVIDER KNOW ABOUT:   Any allergies you have.   All medicines you are taking, including vitamins, herbs, eyedrops, and over-the-counter medicines and creams.   Previous  problems you or members of your family have had with the use of anesthetics.   Any blood disorders you have had.   Other health problems you have. RISKS AND COMPLICATIONS Generally, having a facet joint block is safe. However, as with any procedure, complications can occur. Possible complications associated with having a facet joint block include:   Bleeding.   Injury to a nerve near the injection site.   Pain at the injection site.   Weakness or numbness in areas controlled by nerves near the injection site.   Infection.   Temporary fluid retention.   Allergic reaction to anesthetics or medicines used during the procedure. BEFORE THE PROCEDURE   Follow your health care provider's instructions if you are taking dietary supplements or medicines. You may need to stop taking them or reduce your dosage.   Do not take any new dietary supplements or medicines without asking your health care provider first.   Follow your health care provider's instructions about eating and drinking before the procedure. You may need to stop eating and drinking several hours before the procedure.   Arrange to have an adult drive you home after the procedure. PROCEDURE  You may need to remove your clothing and dress in an open-back gown so that your health care provider can access your spine.   The procedure will be done while you are lying on an X-ray table. Most of the time you will be asked to lie on your stomach, but you may be asked to lie in a different position if an injection will be made  in your neck.   Special machines will be used to monitor your oxygen levels, heart rate, and blood pressure.   If an injection will be made in your neck, an intravenous (IV) tube will be inserted into one of your veins. Fluids and medicine will flow directly into your body through the IV tube.   The area over the facet joint where the injection will be made will be cleaned with an antiseptic  soap. The surrounding skin will be covered with sterile drapes.   An anesthetic will be applied to your skin to make the injection area numb. You may feel a temporary stinging or burning sensation.   A video X-ray machine will be used to locate the joint. A contrast dye may be injected into the facet joint area to help with locating the joint.   When the joint is located, an anesthetic medicine will be injected into the joint through the needle.   Your health care provider will ask you whether you feel pain relief. If you do feel relief, a steroid may be injected to provide pain relief for a longer period of time. If you do not feel relief or feel only partial relief, additional injections of an anesthetic may be made in other facet joints.   The needle will be removed, the skin will be cleansed, and bandages will be applied.  AFTER THE PROCEDURE   You will be observed for 15-30 minutes before being allowed to go home. Do not drive. Have an adult drive you or take a taxi or public transportation instead.   If you feel pain relief, the pain will return in several hours or days when the anesthetic wears off.   You may feel pain relief 2-14 days after the procedure. The amount of time this relief lasts varies from person to person.   It is normal to feel some tenderness over the injected area(s) for 2 days following the procedure.   If you have diabetes, you may have a temporary increase in blood sugar.   This information is not intended to replace advice given to you by your health care provider. Make sure you discuss any questions you have with your health care provider.   Document Released: 04/24/2007 Document Revised: 12/24/2014 Document Reviewed: 09/22/2012 Elsevier Interactive Patient Education [employer]

## 2015-12-02 ENCOUNTER — Other Ambulatory Visit: Payer: Self-pay | Admitting: Pain Medicine

## 2015-12-07 ENCOUNTER — Encounter: Payer: Self-pay | Admitting: Pain Medicine

## 2015-12-07 ENCOUNTER — Ambulatory Visit: Payer: Medicare Other | Attending: Pain Medicine | Admitting: Pain Medicine

## 2015-12-07 VITALS — BP 112/56 | HR 66 | Temp 98.2°F | Resp 16 | Ht 64.0 in | Wt 260.0 lb

## 2015-12-07 DIAGNOSIS — N803 Endometriosis of pelvic peritoneum, unspecified: Secondary | ICD-10-CM

## 2015-12-07 DIAGNOSIS — M545 Low back pain: Secondary | ICD-10-CM | POA: Diagnosis present

## 2015-12-07 DIAGNOSIS — M5136 Other intervertebral disc degeneration, lumbar region: Secondary | ICD-10-CM

## 2015-12-07 DIAGNOSIS — M79605 Pain in left leg: Secondary | ICD-10-CM | POA: Diagnosis present

## 2015-12-07 DIAGNOSIS — M47818 Spondylosis without myelopathy or radiculopathy, sacral and sacrococcygeal region: Secondary | ICD-10-CM

## 2015-12-07 DIAGNOSIS — M461 Sacroiliitis, not elsewhere classified: Secondary | ICD-10-CM

## 2015-12-07 DIAGNOSIS — M79604 Pain in right leg: Secondary | ICD-10-CM | POA: Diagnosis present

## 2015-12-07 DIAGNOSIS — M47816 Spondylosis without myelopathy or radiculopathy, lumbar region: Secondary | ICD-10-CM | POA: Diagnosis not present

## 2015-12-07 DIAGNOSIS — M533 Sacrococcygeal disorders, not elsewhere classified: Secondary | ICD-10-CM

## 2015-12-07 DIAGNOSIS — M25559 Pain in unspecified hip: Secondary | ICD-10-CM

## 2015-12-07 MED ORDER — ORPHENADRINE CITRATE 30 MG/ML IJ SOLN: 60.0000 mg | Freq: Once | INTRAMUSCULAR | Status: DC

## 2015-12-07 MED ORDER — FENTANYL CITRATE (PF) 100 MCG/2ML IJ SOLN
INTRAMUSCULAR | Status: AC
  Administered 2015-12-07: 100 ug via INTRAVENOUS
  Filled 2015-12-07: qty 2

## 2015-12-07 MED ORDER — MIDAZOLAM HCL 5 MG/5ML IJ SOLN: 5.0000 mg | Freq: Once | INTRAMUSCULAR | Status: DC

## 2015-12-07 MED ORDER — TRIAMCINOLONE ACETONIDE 40 MG/ML IJ SUSP: 40.0000 mg | Freq: Once | INTRAMUSCULAR | Status: DC

## 2015-12-07 MED ORDER — FENTANYL CITRATE (PF) 100 MCG/2ML IJ SOLN: 100.0000 ug | Freq: Once | INTRAMUSCULAR | Status: DC

## 2015-12-07 MED ORDER — LACTATED RINGERS IV SOLN
1000.0000 mL | INTRAVENOUS | Status: DC
Start: 2015-12-07 — End: 2019-01-27

## 2015-12-07 MED ORDER — BUPIVACAINE HCL (PF) 0.25 % IJ SOLN
INTRAMUSCULAR | Status: AC
  Administered 2015-12-07: 09:00:00
  Filled 2015-12-07: qty 30

## 2015-12-07 MED ORDER — TRIAMCINOLONE ACETONIDE 40 MG/ML IJ SUSP
INTRAMUSCULAR | Status: AC
  Administered 2015-12-07: 09:00:00
  Filled 2015-12-07: qty 1

## 2015-12-07 MED ORDER — ORPHENADRINE CITRATE 30 MG/ML IJ SOLN
INTRAMUSCULAR | Status: AC
  Administered 2015-12-07: 09:00:00
  Filled 2015-12-07: qty 2

## 2015-12-07 MED ORDER — BUPIVACAINE HCL (PF) 0.25 % IJ SOLN: 30.0000 mL | Freq: Once | INTRAMUSCULAR | Status: DC

## 2015-12-07 MED ORDER — MIDAZOLAM HCL 5 MG/5ML IJ SOLN
INTRAMUSCULAR | Status: AC
  Administered 2015-12-07: 5 mg via INTRAVENOUS
  Filled 2015-12-07: qty 5

## 2015-12-07 NOTE — Progress Notes (Signed)
Safety precautions to be maintained throughout the outpatient stay will include: orient to surroundings, keep bed in low position, maintain call bell within reach at all times, provide assistance with transfer out of bed and ambulation.  

## 2015-12-07 NOTE — Progress Notes (Signed)
Subjective:    Patient ID: [name], female    DOB: [birth year], 40 y.o.   MRN: [id number]  HPI  PROCEDURE PERFORMED: Lumbar facet (medial branch block)   NOTE: The patient is a 39 y.o. female who returns to [facility] for further evaluation and treatment of pain involving the lumbar and lower extremity region.  MRI  revealed the patient to be with evidence of  multilevel degenerative changes of the lumbar spine. . The patient is with pain felt to be due to lumbar facet syndromeThe risks, benefits, and expectations of the procedure have been discussed and explained to the patient who was understanding and in agreement with suggested treatment plan. We will proceed with interventional treatment as discussed and as explained to the patient who was understanding and wished to proceed with procedure as planned.   DESCRIPTION OF PROCEDURE: Lumbar facet (medial branch block) with IV Versed, IV fentanyl conscious sedation, EKG, blood pressure, pulse, and pulse oximetry monitoring. The procedure was performed with the patient in the prone position. Betadine prep of proposed entry site performed.   NEEDLE PLACEMENT AT:  Left L  2 lumbar facet (medial branch block). Under fluoroscopic guidance with oblique orientation of 15 degrees, a 22-gauge needle was inserted at the L  2 vertebral body level with needle placed at the targeted area of Burton's Eye or Eye of the Scotty Dog with documentation of needle placement in the superior and lateral border of targeted area of Burton's Eye or Eye of the Scotty Dog with oblique orientation of 15 degrees. Following documentation of needle placement at the L  2 vertebral body level, needle placement was then accomplished at the L  3 vertebral body level.   NEEDLE PLACEMENT AT  L3, L4, and L5 VERTEBRAL BODY LEVELS ON THE LEFT SIDE The procedure was performed at the  L3, L4, and L5 vertebral body levels exactly as was performed at the L  L2 vertebral body  level utilizing the same technique and under fluoroscopic guidance.  NEEDLE PLACEMENT AT THE SACRAL ALA with AP view of the lumbosacral spine. With the patient in the prone position, Betadine prep of proposed entry site accomplished, a 22 gauge needle was inserted in the region of the sacral ala (groove formed by the superior articulating process of S1 and the sacral wing). Following documentation of needle placement at the sacral ala,  needle placement was then accomplished at the S1 foramen level.     Needle placement was then verified at all levels on lateral view. Following documentation of needle placement at all levels on lateral view and following negative aspiration for heme and CSF, each level was injected with 1 mL of 0.25% bupivacaine with Kenalog.     LUMBAR FACET, MEDIAL BRANCH NERVE, BLOCKS PERFORMED ON THE RIGHT SIDE   The procedure was performed on the right side exactly as was performed on the left side at the same levels and utilizing the same technique under fluoroscopic guidance.     The patient tolerated the procedure well. A total of 40 mg of Kenalog was utilized for the procedure.   PLAN:  1. Medications: The patient will continue presently prescribed medications. Neurontin and hydrocodone acetaminophen and Flexeril 2. May consider modification of treatment regimen at time of return appointment pending response to treatment rendered on today's visit. 3. The patient is to follow-up with primary care physician  Dr. Gavin Potters for further evaluation of blood pressure and general medical condition status post steroid  injection performed on today's visit. 4. Surgical follow-up evaluation. Has been addressed 5. Neurological follow-up evaluation. Has been addressed 6.  Appointment with Dr.Zolnoun  As discussed 7. The patient may be candidate for radiofrequency procedures, implantation type procedures, and other treatment pending response to treatment and follow-up  evaluation. 8. The patient has been advised to call the Pain Management Center prior to scheduled return appointment should there be significant change in condition or should patient have other concerns regarding condition prior to scheduled return appointment.  The patient is understanding and in agreement with suggested treatment plan.   Review of Systems     Objective:   Physical Exam        Assessment & Plan:

## 2015-12-07 NOTE — Patient Instructions (Addendum)
PLAN   Continue present medication Neurontin Flexeril and hydrocodone acetaminophen  F/U PCP for evaliation of  BP and general medical  condition  F/U surgical evaluation. May consider pending follow-up evaluations  F/U neurological evaluation. May consider pending follow-up evaluations  F/U Dr Orion CrookZolnoun as planned  May consider radiofrequency rhizolysis or intraspinal procedures pending response to present treatment and F/U evaluation   Patient to call [facility] Management Center should patient have concerns prior to scheduled return appointment.  Pain Management Discharge Instructions  General Discharge Instructions :  If you need to reach your doctor call: Monday-Friday 8:00 am - 4:00 pm at [phone] or toll free [phone].  After clinic hours [phone] to have operator reach doctor.  Bring all of your medication bottles to all your appointments in the pain clinic.  To cancel or reschedule your appointment with Pain Management please remember to call 24 hours in advance to avoid a fee.  Refer to the educational materials which you have been given on: General Risks, I had my Procedure. Discharge Instructions, Post Sedation.  Post Procedure Instructions:  The drugs you were given will stay in your system until tomorrow, so for the next 24 hours you should not drive, make any legal decisions or drink any alcoholic beverages.  You may eat anything you prefer, but it is better to start with liquids then soups and crackers, and gradually work up to solid foods.  Please notify your doctor immediately if you have any unusual bleeding, trouble breathing or pain that is not related to your normal pain.  Depending on the type of procedure that was done, some parts of your body may feel week and/or numb.  This usually clears up by tonight or the next day.  Walk with the use of an assistive device or accompanied by an adult for the 24 hours.  You may use ice on the affected area  for the first 24 hours.  Put ice in a Ziploc bag and cover with a towel and place against area 15 minutes on 15 minutes off.  You may switch to heat after 24 hours.

## 2015-12-08 ENCOUNTER — Telehealth: Payer: Self-pay

## 2015-12-08 NOTE — Telephone Encounter (Signed)
Post procedure call.  Patient states she is doing OK.

## 2015-12-22 ENCOUNTER — Ambulatory Visit: Payer: Medicare Other | Attending: Pain Medicine | Admitting: Pain Medicine

## 2015-12-22 ENCOUNTER — Encounter: Payer: Self-pay | Admitting: Pain Medicine

## 2015-12-22 VITALS — BP 105/60 | HR 73 | Temp 98.2°F | Resp 16 | Ht 64.0 in | Wt 255.0 lb

## 2015-12-22 DIAGNOSIS — M545 Low back pain: Secondary | ICD-10-CM | POA: Insufficient documentation

## 2015-12-22 DIAGNOSIS — M461 Sacroiliitis, not elsewhere classified: Secondary | ICD-10-CM

## 2015-12-22 DIAGNOSIS — N803 Endometriosis of pelvic peritoneum, unspecified: Secondary | ICD-10-CM

## 2015-12-22 DIAGNOSIS — M533 Sacrococcygeal disorders, not elsewhere classified: Secondary | ICD-10-CM | POA: Insufficient documentation

## 2015-12-22 DIAGNOSIS — M47818 Spondylosis without myelopathy or radiculopathy, sacral and sacrococcygeal region: Secondary | ICD-10-CM

## 2015-12-22 DIAGNOSIS — M5136 Other intervertebral disc degeneration, lumbar region: Secondary | ICD-10-CM | POA: Insufficient documentation

## 2015-12-22 DIAGNOSIS — N809 Endometriosis, unspecified: Secondary | ICD-10-CM | POA: Insufficient documentation

## 2015-12-22 DIAGNOSIS — M25559 Pain in unspecified hip: Secondary | ICD-10-CM

## 2015-12-22 DIAGNOSIS — M79606 Pain in leg, unspecified: Secondary | ICD-10-CM | POA: Diagnosis present

## 2015-12-22 DIAGNOSIS — M47816 Spondylosis without myelopathy or radiculopathy, lumbar region: Secondary | ICD-10-CM

## 2015-12-22 MED ORDER — HYDROCODONE-ACETAMINOPHEN 7.5-325 MG/15ML PO SOLN: ORAL | Status: DC

## 2015-12-22 NOTE — Progress Notes (Signed)
   Subjective:    Patient ID: [name], female    DOB: 04/27/1976, 40 y.o.   MRN: [id number]  HPI  The patient is a 40 year old female who returns to pain management for further evaluation and treatment of pain involving the lower back and lower extremity regions predominantly. The patient has had significant improvement of pain with previous lumbar facet, medial branch nerve, blocks. We will proceed with radiofrequency rhizolysis lumbar facet, medial branch nerves pending insurance approval. The patient will continue Neurontin Flexeril and hydrocodone acetaminophen medications at this time. The patient's pain increases and becomes more intense as the day progresses. Patient's pain interferes with patient ability to perform sweeping and mopping and other chores around the house. The pain also interferes with activities of daily living outside of the house. We discussed patient's condition and will await insurance approval for radiofrequency rhizolysis lumbar facet, medial branch nerves. The patient was with understanding and in agreement with suggested treatment plan     Review of Systems     Objective:   Physical Exam  There was tenderness to palpation of the splenius capitis and occipitalis muscles regions of mild degree. There was mild tenderness to palpation of the acromioclavicular and glenohumeral joint regions. Palpation of the cervical facet cervical paraspinal musculature region and the thoracic facet thoracic paraspinal musculature region was attends to palpation of moderate degree. There appeared to be unremarkable Spurling's maneuver with minimal tenderness to palpation of the acromioclavicular and glenohumeral joint regions. Tinel and Phalen's maneuver were without increased pain of significant degree and patient appeared to be with bilaterally equal grip strength. Patient over the lumbar paraspinal muscular treat lumbar facet region was attends to palpation of moderate degree  with moderate muscle spasms of the lower thoracic paraspinal musculature region and the lumbar paraspinal musculature region. There was tends to palpation of the PSIS and PII S region of mild to moderate degree. Palpation of the greater trochanteric region iliotibial band region reproduced mild to moderate discomfort as well. Straight leg raising was tolerates approximately 30 without increased pain with dorsiflexion noted. There was mild to moderate tends to palpation of the anterior superior iliac spine region and inguinal region. No masses are noted in the area of the inguinal region. EHL strength appeared to be decreased. There was no definite sensory deficit dermatomal dystrophy lower extremities noted. Rotation lateral bending and extension and palpation of the lumbar facets reproduce predominant portion of patient's pain. Was negative clonus negative Homans. Abdomen was nontender with no costovertebral tenderness noted.                Assessment & Plan:     Degenerative disc disease lumbar spine  Lumbar facet syndrome  Sacroiliac joint dysfunction  Endometriosis    PLAN   Continue present medication Neurontin Flexeril and hydrocodone acetaminophen  F/U PCP Dr.Grandis  for evaliation of  BP and general medical  condition  F/U surgical evaluation. May consider pending follow-up evaluations  F/U neurological evaluation. May consider pending follow-up evaluations  Ask receptionist if you are already approved for radiofrequency of the lumbar facets  F/U Dr Orion CrookZolnoun as planned  May consider radiofrequency rhizolysis or intraspinal procedures pending response to present treatment and F/U evaluation   Patient to call Pain Management Center should patient have concerns prior to scheduled return appointment.

## 2015-12-22 NOTE — Patient Instructions (Addendum)
PLAN   Continue present medication Neurontin Flexeril and hydrocodone acetaminophen  F/U PCP for evaliation of  BP and general medical  condition  F/U surgical evaluation. May consider pending follow-up evaluations  F/U neurological evaluation. May consider pending follow-up evaluations  Ask receptionist if you are already approved for radiofrequency of the lumbar facets  F/U Dr Orion CrookZolnoun as planned  May consider radiofrequency rhizolysis or intraspinal procedures pending response to present treatment and F/U evaluation   Patient to call Pain Management Center should patient have concerns prior to scheduled return appointment.

## 2015-12-22 NOTE — Progress Notes (Signed)
Safety precautions to be maintained throughout the outpatient stay will include: orient to surroundings, keep bed in low position, maintain call bell within reach at all times, provide assistance with transfer out of bed and ambulation.  

## 2016-01-17 ENCOUNTER — Ambulatory Visit: Payer: Medicare Other | Attending: Pain Medicine | Admitting: Pain Medicine

## 2016-01-17 ENCOUNTER — Encounter: Payer: Self-pay | Admitting: Pain Medicine

## 2016-01-17 VITALS — BP 103/65 | HR 73 | Temp 98.2°F | Resp 16 | Wt 260.0 lb

## 2016-01-17 DIAGNOSIS — N809 Endometriosis, unspecified: Secondary | ICD-10-CM | POA: Diagnosis not present

## 2016-01-17 DIAGNOSIS — M51369 Other intervertebral disc degeneration, lumbar region without mention of lumbar back pain or lower extremity pain: Secondary | ICD-10-CM

## 2016-01-17 DIAGNOSIS — M545 Low back pain: Secondary | ICD-10-CM | POA: Diagnosis present

## 2016-01-17 DIAGNOSIS — M461 Sacroiliitis, not elsewhere classified: Secondary | ICD-10-CM

## 2016-01-17 DIAGNOSIS — R102 Pelvic and perineal pain: Secondary | ICD-10-CM | POA: Insufficient documentation

## 2016-01-17 DIAGNOSIS — M533 Sacrococcygeal disorders, not elsewhere classified: Secondary | ICD-10-CM

## 2016-01-17 DIAGNOSIS — N803 Endometriosis of pelvic peritoneum, unspecified: Secondary | ICD-10-CM

## 2016-01-17 DIAGNOSIS — M25559 Pain in unspecified hip: Secondary | ICD-10-CM

## 2016-01-17 DIAGNOSIS — M5136 Other intervertebral disc degeneration, lumbar region: Secondary | ICD-10-CM | POA: Diagnosis not present

## 2016-01-17 DIAGNOSIS — M79606 Pain in leg, unspecified: Secondary | ICD-10-CM | POA: Diagnosis present

## 2016-01-17 DIAGNOSIS — M47816 Spondylosis without myelopathy or radiculopathy, lumbar region: Secondary | ICD-10-CM

## 2016-01-17 DIAGNOSIS — M47818 Spondylosis without myelopathy or radiculopathy, sacral and sacrococcygeal region: Secondary | ICD-10-CM

## 2016-01-17 MED ORDER — HYDROCODONE-ACETAMINOPHEN 7.5-325 MG/15ML PO SOLN: ORAL | Status: DC

## 2016-01-17 NOTE — Progress Notes (Signed)
   Subjective:    Patient ID: [name], female    DOB: 04-18-1976, 40 y.o.   MRN: [id number]  HPI The patient is a 40 year old female who returns to pain management for further evaluation and treatment of pain involving the region of the lower back and lower extremity region with pelvic pain is well. The patient states that her pelvic pain appears to be related to ovarian dysfunction. The patient will follow-up with Dr.Zolnoun in this regard. The patient denies any recent trauma change in events of daily living the call significant change in symptomatology. We discussed patient's condition and will continue medication hydrocodone acetaminophen as prescribed. The patient states the pain occurs across the region of the back with pain occurring in the region of the lower back lower extremity region and across the region of the buttocks of significant degree. We will consider patient for block of nerves to the sacroiliac joint to be performed at time return appointment and consider additional modifications of treatment pending follow-up evaluation for further assessment of patient including follow-up evaluation with Dr. Orion Crook . The patient was in agreement with suggested treatment plan.   Review of Systems     Objective:   Physical Exam  there was tenderness over the splenius capitis and occipitalis muscles regions. Palpation of these regions reproduce mild discomfort. There was mild tenderness of the region cervical facet cervical paraspinal must reason as well as the thoracic facet thoracic paraspinal musculature region. There was mild tenderness of the acromial clavicular and glenohumeral joint regions. There appeared to be unremarkable Spurling's maneuver. The patient was with bilaterally equal grip strength and Tinel and Phalen's maneuver were without increase of pain of significant degree. Palpation over the thoracic facet thoracic paraspinal must reason was evidence of muscle spasms in the  lower thoracic region with palpation over the lumbar paraspinal must reason lumbar facet region with evidence of severe muscle spasms and with severe tends to palpation over the lumbar facets on the left as well as on the right with the lower lumbar facets being more sensitive to palpation. There was significant tenderness to palpation of the PSIS and PII S region as well as the gluteal and piriformis musculature regions. Straight leg raising was tolerates approximately 30 without a definite increased pain with dorsiflexion noted. DTRs were difficult to elicit patient had difficulty relaxing. There was negative clonus negative Homans. Abdomen was with slight lower quadrant tenderness to palpation and no costovertebral tenderness noted.       Assessment & Plan:    Degenerative disc disease lumbar spine  Lumbar facet syndrome  Sacroiliac joint dysfunction  Endometriosis     PLAN   Continue present medication Neurontin Flexeril and hydrocodone acetaminophen  Block of nerves to the sacroiliac joint to be performed at time of return appointment  F/U PCP for evaliation of  BP and general medical  condition  F/U surgical evaluation. May consider pending follow-up evaluations  F/U neurological evaluation. May consider pending follow-up evaluations  Ask receptionist if you are already approved for radiofrequency of the lumbar facets  F/U Dr Orion Crook as planned. Discuss ovarian function and pelvic pain at time of return appointment  May consider radiofrequency rhizolysis or intraspinal procedures pending response to present treatment and F/U evaluation   Patient to call Pain Management Center should patient have concerns prior to scheduled return appointment.

## 2016-01-17 NOTE — Progress Notes (Signed)
Safety precautions to be maintained throughout the outpatient stay will include: orient to surroundings, keep bed in low position, maintain call bell within reach at all times, provide assistance with transfer out of bed and ambulation.  

## 2016-01-17 NOTE — Patient Instructions (Addendum)
PLAN   Continue present medication Neurontin Flexeril and hydrocodone acetaminophen  Block of nerves to the sacroiliac joint to be performed at time of return appointment  F/U PCP for evaliation of  BP and general medical  condition  F/U surgical evaluation. May consider pending follow-up evaluations  F/U neurological evaluation. May consider pending follow-up evaluations  Ask receptionist if you are already approved for radiofrequency of the lumbar facets  F/U Dr Orion Crook as planned. Discuss ovarian function and pelvic pain at time of return appointment  May consider radiofrequency rhizolysis or intraspinal procedures pending response to present treatment and F/U evaluation   Patient to call [facility] should patient have concerns prior to scheduled return appointment.Pain Management Discharge Instructions  General Discharge Instructions :  If you need to reach your doctor call: Monday-Friday 8:00 am - 4:00 pm at [phone] or toll free [phone].  After clinic hours [phone] to have operator reach doctor.  Bring all of your medication bottles to all your appointments in the pain clinic.  To cancel or reschedule your appointment with Pain Management please remember to call 24 hours in advance to avoid a fee.  Refer to the educational materials which you have been given on: General Risks, I had my Procedure. Discharge Instructions, Post Sedation.  Post Procedure Instructions:  The drugs you were given will stay in your system until tomorrow, so for the next 24 hours you should not drive, make any legal decisions or drink any alcoholic beverages.  You may eat anything you prefer, but it is better to start with liquids then soups and crackers, and gradually work up to solid foods.  Please notify your doctor immediately if you have any unusual bleeding, trouble breathing or pain that is not related to your normal pain.  Depending on the type of procedure that  was done, some parts of your body may feel week and/or numb.  This usually clears up by tonight or the next day.  Walk with the use of an assistive device or accompanied by an adult for the 24 hours.  You may use ice on the affected area for the first 24 hours.  Put ice in a Ziploc bag and cover with a towel and place against area 15 minutes on 15 minutes off.  You may switch to heat after 24 hours.Sacroiliac (SI) Joint Injection Patient Information  Description: The sacroiliac joint connects the scrum (very low back and tailbone) to the ilium (a pelvic bone which also forms half of the hip joint).  Normally this joint experiences very little motion.  When this joint becomes inflamed or unstable low back and or hip and pelvis pain may result.  Injection of this joint with local anesthetics (numbing medicines) and steroids can provide diagnostic information and reduce pain.  This injection is performed with the aid of x-ray guidance into the tailbone area while you are lying on your stomach.   You may experience an electrical sensation down the leg while this is being done.  You may also experience numbness.  We also may ask if we are reproducing your normal pain during the injection.  Conditions which may be treated SI injection:   Low back, buttock, hip or leg pain  Preparation for the Injection:  1. Do not eat any solid food or dairy products within 6 hours of your appointment.  2. You may drink clear liquids up to 2 hours before appointment.  Clear liquids include water, black coffee, juice or soda.  No milk or cream please. 3. You may take your regular medications, including pain medications with a sip of water before your appointment.  Diabetics should hold regular insulin (if take separately) and take 1/2 normal NPH dose the morning of the procedure.  Carry some sugar containing items with you to your appointment. 4. A driver must accompany you and be prepared to drive you home after your  procedure. 5. Bring all of your current medications with you. 6. An IV may be inserted and sedation may be given at the discretion of the physician. 7. A blood pressure cuff, EKG and other monitors will often be applied during the procedure.  Some patients may need to have extra oxygen administered for a short period.  8. You will be asked to provide medical information, including your allergies, prior to the procedure.  We must know immediately if you are taking blood thinners (like Coumadin/Warfarin) or if you are allergic to IV iodine contrast (dye).  We must know if you could possible be pregnant.  Possible side effects:   Bleeding from needle site  Infection (rare, may require surgery)  Nerve injury (rare)  Numbness & tingling (temporary)  A brief convulsion or seizure  Light-headedness (temporary)  Pain at injection site (several days)  Decreased blood pressure (temporary)  Weakness in the leg (temporary)   Call if you experience:   New onset weakness or numbness of an extremity below the injection site that last more than 8 hours.  Hives or difficulty breathing ( go to the emergency room)  Inflammation or drainage at the injection site  Any new symptoms which are concerning to you  Please note:  Although the local anesthetic injected can often make your back/ hip/ buttock/ leg feel good for several hours after the injections, the pain will likely return.  It takes 3-7 days for steroids to work in the sacroiliac area.  You may not notice any pain relief for at least that one week.  If effective, we will often do a series of three injections spaced 3-6 weeks apart to maximally decrease your pain.  After the initial series, we generally will wait some months before a repeat injection of the same type.  If you have any questions, please call [phone] [facility]  GENERAL RISKS AND COMPLICATIONS  What are the risk, side  effects and possible complications? Generally speaking, most procedures are safe.  However, with any procedure there are risks, side effects, and the possibility of complications.  The risks and complications are dependent upon the sites that are lesioned, or the type of nerve block to be performed.  The closer the procedure is to the spine, the more serious the risks are.  Great care is taken when placing the radio frequency needles, block needles or lesioning probes, but sometimes complications can occur. 1. Infection: Any time there is an injection through the skin, there is a risk of infection.  This is why sterile conditions are used for these blocks.  There are four possible types of infection. 1. Localized skin infection. 2. Central Nervous System Infection-This can be in the form of Meningitis, which can be deadly. 3. Epidural Infections-This can be in the form of an epidural abscess, which can cause pressure inside of the spine, causing compression of the spinal cord with subsequent paralysis. This would require an emergency surgery to decompress, and there are no guarantees that the patient would recover from the paralysis. 4. Discitis-This is  an infection of the intervertebral discs.  It occurs in about 1% of discography procedures.  It is difficult to treat and it may lead to surgery.        2. Pain: the needles have to go through skin and soft tissues, will cause soreness.       3. Damage to internal structures:  The nerves to be lesioned may be near blood vessels or    other nerves which can be potentially damaged.       4. Bleeding: Bleeding is more common if the patient is taking blood thinners such as  aspirin, Coumadin, Ticiid, Plavix, etc., or if he/she have some genetic predisposition  such as hemophilia. Bleeding into the spinal canal can cause compression of the spinal  cord with subsequent paralysis.  This would require an emergency surgery to  decompress and there are no guarantees  that the patient would recover from the  paralysis.       5. Pneumothorax:  Puncturing of a lung is a possibility, every time a needle is introduced in  the area of the chest or upper back.  Pneumothorax refers to free air around the  collapsed lung(s), inside of the thoracic cavity (chest cavity).  Another two possible  complications related to a similar event would include: Hemothorax and Chylothorax.   These are variations of the Pneumothorax, where instead of air around the collapsed  lung(s), you may have blood or chyle, respectively.       6. Spinal headaches: They may occur with any procedures in the area of the spine.       7. Persistent CSF (Cerebro-Spinal Fluid) leakage: This is a rare problem, but may occur  with prolonged intrathecal or epidural catheters either due to the formation of a fistulous  track or a dural tear.       8. Nerve damage: By working so close to the spinal cord, there is always a possibility of  nerve damage, which could be as serious as a permanent spinal cord injury with  paralysis.       9. Death:  Although rare, severe deadly allergic reactions known as "Anaphylactic  reaction" can occur to any of the medications used.      10. Worsening of the symptoms:  We can always make thing worse.  What are the chances of something like this happening? Chances of any of this occuring are extremely low.  By statistics, you have more of a chance of getting killed in a motor vehicle accident: while driving to the hospital than any of the above occurring .  Nevertheless, you should be aware that they are possibilities.  In general, it is similar to taking a shower.  Everybody knows that you can slip, hit your head and get killed.  Does that mean that you should not shower again?  Nevertheless always keep in mind that statistics do not mean anything if you happen to be on the wrong side of them.  Even if a procedure has a 1 (one) in a 1,000,000 (million) chance of going wrong, it you  happen to be that one..Also, keep in mind that by statistics, you have more of a chance of having something go wrong when taking medications.  Who should not have this procedure? If you are on a blood thinning medication (e.g. Coumadin, Plavix, see list of "Blood Thinners"), or if you have an active infection going on, you should not have the procedure.  If you are taking  any blood thinners, please inform your physician.  How should I prepare for this procedure?  Do not eat or drink anything at least six hours prior to the procedure.  Bring a driver with you .  It cannot be a taxi.  Come accompanied by an adult that can drive you back, and that is strong enough to help you if your legs get weak or numb from the local anesthetic.  Take all of your medicines the morning of the procedure with just enough water to swallow them.  If you have diabetes, make sure that you are scheduled to have your procedure done first thing in the morning, whenever possible.  If you have diabetes, take only half of your insulin dose and notify our nurse that you have done so as soon as you arrive at the clinic.  If you are diabetic, but only take blood sugar pills (oral hypoglycemic), then do not take them on the morning of your procedure.  You may take them after you have had the procedure.  Do not take aspirin or any aspirin-containing medications, at least eleven (11) days prior to the procedure.  They may prolong bleeding.  Wear loose fitting clothing that may be easy to take off and that you would not mind if it got stained with Betadine or blood.  Do not wear any jewelry or perfume  Remove any nail coloring.  It will interfere with some of our monitoring equipment.  NOTE: Remember that this is not meant to be interpreted as a complete list of all possible complications.  Unforeseen problems may occur.  BLOOD THINNERS The following drugs contain aspirin or other products, which can cause increased  bleeding during surgery and should not be taken for 2 weeks prior to and 1 week after surgery.  If you should need take something for relief of minor pain, you may take acetaminophen which is found in Tylenol,m Datril, Anacin-3 and Panadol. It is not blood thinner. The products listed below are.  Do not take any of the products listed below in addition to any listed on your instruction sheet.  A.P.C or A.P.C with Codeine Codeine Phosphate Capsules #3 Ibuprofen Ridaura  ABC compound Congesprin Imuran rimadil  Advil Cope Indocin Robaxisal  Alka-Seltzer Effervescent Pain Reliever and Antacid Coricidin or Coricidin-D  Indomethacin Rufen  Alka-Seltzer plus Cold Medicine Cosprin Ketoprofen S-A-C Tablets  Anacin Analgesic Tablets or Capsules Coumadin Korlgesic Salflex  Anacin Extra Strength Analgesic tablets or capsules CP-2 Tablets Lanoril Salicylate  Anaprox Cuprimine Capsules Levenox Salocol  Anexsia-D Dalteparin Magan Salsalate  Anodynos Darvon compound Magnesium Salicylate Sine-off  Ansaid Dasin Capsules Magsal Sodium Salicylate  Anturane Depen Capsules Marnal Soma  APF Arthritis pain formula Dewitt's Pills Measurin Stanback  Argesic Dia-Gesic Meclofenamic Sulfinpyrazone  Arthritis Bayer Timed Release Aspirin Diclofenac Meclomen Sulindac  Arthritis pain formula Anacin Dicumarol Medipren Supac  Analgesic (Safety coated) Arthralgen Diffunasal Mefanamic Suprofen  Arthritis Strength Bufferin Dihydrocodeine Mepro Compound Suprol  Arthropan liquid Dopirydamole Methcarbomol with Aspirin Synalgos  ASA tablets/Enseals Disalcid Micrainin Tagament  Ascriptin Doan's Midol Talwin  Ascriptin A/D Dolene Mobidin Tanderil  Ascriptin Extra Strength Dolobid Moblgesic Ticlid  Ascriptin with Codeine Doloprin or Doloprin with Codeine Momentum Tolectin  Asperbuf Duoprin Mono-gesic Trendar  Aspergum Duradyne Motrin or Motrin IB Triminicin  Aspirin plain, buffered or enteric coated Durasal Myochrisine Trigesic   Aspirin Suppositories Easprin Nalfon Trillsate  Aspirin with Codeine Ecotrin Regular or Extra Strength Naprosyn Uracel  Atromid-S Efficin Naproxen Ursinus  Auranofin Capsules Elmiron Neocylate  Vanquish  Axotal Emagrin Norgesic Verin  Azathioprine Empirin or Empirin with Codeine Normiflo Vitamin E  Azolid Emprazil Nuprin Voltaren  Bayer Aspirin plain, buffered or children's or timed BC Tablets or powders Encaprin Orgaran Warfarin Sodium  Buff-a-Comp Enoxaparin Orudis Zorpin  Buff-a-Comp with Codeine Equegesic Os-Cal-Gesic   Buffaprin Excedrin plain, buffered or Extra Strength Oxalid   Bufferin Arthritis Strength Feldene Oxphenbutazone   Bufferin plain or Extra Strength Feldene Capsules Oxycodone with Aspirin   Bufferin with Codeine Fenoprofen Fenoprofen Pabalate or Pabalate-SF   Buffets II Flogesic Panagesic   Buffinol plain or Extra Strength Florinal or Florinal with Codeine Panwarfarin   Buf-Tabs Flurbiprofen Penicillamine   Butalbital Compound Four-way cold tablets Penicillin   Butazolidin Fragmin Pepto-Bismol   Carbenicillin Geminisyn Percodan   Carna Arthritis Reliever Geopen Persantine   Carprofen Gold's salt Persistin   Chloramphenicol Goody's Phenylbutazone   Chloromycetin Haltrain Piroxlcam   Clmetidine heparin Plaquenil   Cllnoril Hyco-pap Ponstel   Clofibrate Hydroxy chloroquine Propoxyphen         Before stopping any of these medications, be sure to consult the physician who ordered them.  Some, such as Coumadin (Warfarin) are ordered to prevent or treat serious conditions such as "deep thrombosis", "pumonary embolisms", and other heart problems.  The amount of time that you may need off of the medication may also vary with the medication and the reason for which you were taking it.  If you are taking any of these medications, please make sure you notify your pain physician before you undergo any procedures.

## 2016-02-06 ENCOUNTER — Ambulatory Visit: Payer: Medicare Other | Admitting: Pain Medicine

## 2016-02-08 ENCOUNTER — Encounter: Payer: Self-pay | Admitting: Pain Medicine

## 2016-02-08 ENCOUNTER — Ambulatory Visit: Payer: Medicare Other | Attending: Pain Medicine | Admitting: Pain Medicine

## 2016-02-08 VITALS — BP 104/58 | HR 64 | Temp 97.6°F | Resp 14 | Ht 64.0 in | Wt 255.0 lb

## 2016-02-08 DIAGNOSIS — M533 Sacrococcygeal disorders, not elsewhere classified: Secondary | ICD-10-CM

## 2016-02-08 DIAGNOSIS — M461 Sacroiliitis, not elsewhere classified: Secondary | ICD-10-CM

## 2016-02-08 DIAGNOSIS — M5136 Other intervertebral disc degeneration, lumbar region: Secondary | ICD-10-CM | POA: Diagnosis not present

## 2016-02-08 DIAGNOSIS — M79606 Pain in leg, unspecified: Secondary | ICD-10-CM | POA: Diagnosis present

## 2016-02-08 DIAGNOSIS — N803 Endometriosis of pelvic peritoneum, unspecified: Secondary | ICD-10-CM

## 2016-02-08 DIAGNOSIS — M47818 Spondylosis without myelopathy or radiculopathy, sacral and sacrococcygeal region: Secondary | ICD-10-CM

## 2016-02-08 DIAGNOSIS — M25559 Pain in unspecified hip: Secondary | ICD-10-CM

## 2016-02-08 DIAGNOSIS — M47816 Spondylosis without myelopathy or radiculopathy, lumbar region: Secondary | ICD-10-CM

## 2016-02-08 DIAGNOSIS — M545 Low back pain: Secondary | ICD-10-CM | POA: Diagnosis present

## 2016-02-08 MED ORDER — BUPIVACAINE HCL (PF) 0.25 % IJ SOLN: 30.0000 mL | Freq: Once | INTRAMUSCULAR | Status: DC

## 2016-02-08 MED ORDER — TRIAMCINOLONE ACETONIDE 40 MG/ML IJ SUSP
INTRAMUSCULAR | Status: AC
  Administered 2016-02-08: 09:00:00
  Filled 2016-02-08: qty 1

## 2016-02-08 MED ORDER — MIDAZOLAM HCL 5 MG/5ML IJ SOLN
INTRAMUSCULAR | Status: AC
  Administered 2016-02-08: 5 mg via INTRAVENOUS
  Filled 2016-02-08: qty 5

## 2016-02-08 MED ORDER — HYDROCODONE-ACETAMINOPHEN 7.5-325 MG/15ML PO SOLN: ORAL | Status: DC

## 2016-02-08 MED ORDER — MIDAZOLAM HCL 5 MG/5ML IJ SOLN: 5.0000 mg | Freq: Once | INTRAMUSCULAR | Status: DC

## 2016-02-08 MED ORDER — ORPHENADRINE CITRATE 30 MG/ML IJ SOLN: 60.0000 mg | Freq: Once | INTRAMUSCULAR | Status: DC

## 2016-02-08 MED ORDER — TRIAMCINOLONE ACETONIDE 40 MG/ML IJ SUSP: 40.0000 mg | Freq: Once | INTRAMUSCULAR | Status: DC

## 2016-02-08 MED ORDER — BUPIVACAINE HCL (PF) 0.25 % IJ SOLN
INTRAMUSCULAR | Status: AC
  Administered 2016-02-08: 09:00:00
  Filled 2016-02-08: qty 30

## 2016-02-08 MED ORDER — ORPHENADRINE CITRATE 30 MG/ML IJ SOLN
INTRAMUSCULAR | Status: AC
  Administered 2016-02-08: 09:00:00
  Filled 2016-02-08: qty 2

## 2016-02-08 MED ORDER — FENTANYL CITRATE (PF) 100 MCG/2ML IJ SOLN
INTRAMUSCULAR | Status: AC
  Administered 2016-02-08: 100 ug
  Filled 2016-02-08: qty 2

## 2016-02-08 MED ORDER — LACTATED RINGERS IV SOLN: 1000.0000 mL | INTRAVENOUS | Status: DC

## 2016-02-08 MED ORDER — FENTANYL CITRATE (PF) 100 MCG/2ML IJ SOLN: 100.0000 ug | Freq: Once | INTRAMUSCULAR | Status: DC

## 2016-02-08 NOTE — Progress Notes (Signed)
Safety precautions to be maintained throughout the outpatient stay will include: orient to surroundings, keep bed in low position, maintain call bell within reach at all times, provide assistance with transfer out of bed and ambulation.  

## 2016-02-08 NOTE — Patient Instructions (Addendum)
PLAN   Continue present medication Neurontin Flexeril and hydrocodone acetaminophen  F/U PCP for evaliation of  BP and general medical  condition as discussed  F/U surgical evaluation. May consider pending follow-up evaluations  F/U neurological evaluation. May consider PNCV/EMG studies and other studies pending follow-up evaluations  F/U Dr Orion Crook as planned  May consider radiofrequency rhizolysis or intraspinal procedures pending response to present treatment and F/U evaluation   Patient to call [facility] should patient have concerns prior to scheduled return appointment.GENERAL RISKS AND COMPLICATIONS  What are the risk, side effects and possible complications? Generally speaking, most procedures are safe.  However, with any procedure there are risks, side effects, and the possibility of complications.  The risks and complications are dependent upon the sites that are lesioned, or the type of nerve block to be performed.  The closer the procedure is to the spine, the more serious the risks are.  Great care is taken when placing the radio frequency needles, block needles or lesioning probes, but sometimes complications can occur. 1. Infection: Any time there is an injection through the skin, there is a risk of infection.  This is why sterile conditions are used for these blocks.  There are four possible types of infection. 1. Localized skin infection. 2. Central Nervous System Infection-This can be in the form of Meningitis, which can be deadly. 3. Epidural Infections-This can be in the form of an epidural abscess, which can cause pressure inside of the spine, causing compression of the spinal cord with subsequent paralysis. This would require an emergency surgery to decompress, and there are no guarantees that the patient would recover from the paralysis. 4. Discitis-This is an infection of the intervertebral discs.  It occurs in about 1% of discography procedures.  It is  difficult to treat and it may lead to surgery.        2. Pain: the needles have to go through skin and soft tissues, will cause soreness.       3. Damage to internal structures:  The nerves to be lesioned may be near blood vessels or    other nerves which can be potentially damaged.       4. Bleeding: Bleeding is more common if the patient is taking blood thinners such as  aspirin, Coumadin, Ticiid, Plavix, etc., or if he/she have some genetic predisposition  such as hemophilia. Bleeding into the spinal canal can cause compression of the spinal  cord with subsequent paralysis.  This would require an emergency surgery to  decompress and there are no guarantees that the patient would recover from the  paralysis.       5. Pneumothorax:  Puncturing of a lung is a possibility, every time a needle is introduced in  the area of the chest or upper back.  Pneumothorax refers to free air around the  collapsed lung(s), inside of the thoracic cavity (chest cavity).  Another two possible  complications related to a similar event would include: Hemothorax and Chylothorax.   These are variations of the Pneumothorax, where instead of air around the collapsed  lung(s), you may have blood or chyle, respectively.       6. Spinal headaches: They may occur with any procedures in the area of the spine.       7. Persistent CSF (Cerebro-Spinal Fluid) leakage: This is a rare problem, but may occur  with prolonged intrathecal or epidural catheters either due to the formation of a fistulous  track or a  dural tear.       8. Nerve damage: By working so close to the spinal cord, there is always a possibility of  nerve damage, which could be as serious as a permanent spinal cord injury with  paralysis.       9. Death:  Although rare, severe deadly allergic reactions known as "Anaphylactic  reaction" can occur to any of the medications used.      10. Worsening of the symptoms:  We can always make thing worse.  What are the chances of  something like this happening? Chances of any of this occuring are extremely low.  By statistics, you have more of a chance of getting killed in a motor vehicle accident: while driving to the hospital than any of the above occurring .  Nevertheless, you should be aware that they are possibilities.  In general, it is similar to taking a shower.  Everybody knows that you can slip, hit your head and get killed.  Does that mean that you should not shower again?  Nevertheless always keep in mind that statistics do not mean anything if you happen to be on the wrong side of them.  Even if a procedure has a 1 (one) in a 1,000,000 (million) chance of going wrong, it you happen to be that one..Also, keep in mind that by statistics, you have more of a chance of having something go wrong when taking medications.  Who should not have this procedure? If you are on a blood thinning medication (e.g. Coumadin, Plavix, see list of "Blood Thinners"), or if you have an active infection going on, you should not have the procedure.  If you are taking any blood thinners, please inform your physician.  How should I prepare for this procedure?  Do not eat or drink anything at least six hours prior to the procedure.  Bring a driver with you .  It cannot be a taxi.  Come accompanied by an adult that can drive you back, and that is strong enough to help you if your legs get weak or numb from the local anesthetic.  Take all of your medicines the morning of the procedure with just enough water to swallow them.  If you have diabetes, make sure that you are scheduled to have your procedure done first thing in the morning, whenever possible.  If you have diabetes, take only half of your insulin dose and notify our nurse that you have done so as soon as you arrive at the clinic.  If you are diabetic, but only take blood sugar pills (oral hypoglycemic), then do not take them on the morning of your procedure.  You may take them  after you have had the procedure.  Do not take aspirin or any aspirin-containing medications, at least eleven (11) days prior to the procedure.  They may prolong bleeding.  Wear loose fitting clothing that may be easy to take off and that you would not mind if it got stained with Betadine or blood.  Do not wear any jewelry or perfume  Remove any nail coloring.  It will interfere with some of our monitoring equipment.  NOTE: Remember that this is not meant to be interpreted as a complete list of all possible complications.  Unforeseen problems may occur.  BLOOD THINNERS The following drugs contain aspirin or other products, which can cause increased bleeding during surgery and should not be taken for 2 weeks prior to and 1 week after surgery.  If you  should need take something for relief of minor pain, you may take acetaminophen which is found in Tylenol,m Datril, Anacin-3 and Panadol. It is not blood thinner. The products listed below are.  Do not take any of the products listed below in addition to any listed on your instruction sheet.  A.P.C or A.P.C with Codeine Codeine Phosphate Capsules #3 Ibuprofen Ridaura  ABC compound Congesprin Imuran rimadil  Advil Cope Indocin Robaxisal  Alka-Seltzer Effervescent Pain Reliever and Antacid Coricidin or Coricidin-D  Indomethacin Rufen  Alka-Seltzer plus Cold Medicine Cosprin Ketoprofen S-A-C Tablets  Anacin Analgesic Tablets or Capsules Coumadin Korlgesic Salflex  Anacin Extra Strength Analgesic tablets or capsules CP-2 Tablets Lanoril Salicylate  Anaprox Cuprimine Capsules Levenox Salocol  Anexsia-D Dalteparin Magan Salsalate  Anodynos Darvon compound Magnesium Salicylate Sine-off  Ansaid Dasin Capsules Magsal Sodium Salicylate  Anturane Depen Capsules Marnal Soma  APF Arthritis pain formula Dewitt's Pills Measurin Stanback  Argesic Dia-Gesic Meclofenamic Sulfinpyrazone  Arthritis Bayer Timed Release Aspirin Diclofenac Meclomen Sulindac   Arthritis pain formula Anacin Dicumarol Medipren Supac  Analgesic (Safety coated) Arthralgen Diffunasal Mefanamic Suprofen  Arthritis Strength Bufferin Dihydrocodeine Mepro Compound Suprol  Arthropan liquid Dopirydamole Methcarbomol with Aspirin Synalgos  ASA tablets/Enseals Disalcid Micrainin Tagament  Ascriptin Doan's Midol Talwin  Ascriptin A/D Dolene Mobidin Tanderil  Ascriptin Extra Strength Dolobid Moblgesic Ticlid  Ascriptin with Codeine Doloprin or Doloprin with Codeine Momentum Tolectin  Asperbuf Duoprin Mono-gesic Trendar  Aspergum Duradyne Motrin or Motrin IB Triminicin  Aspirin plain, buffered or enteric coated Durasal Myochrisine Trigesic  Aspirin Suppositories Easprin Nalfon Trillsate  Aspirin with Codeine Ecotrin Regular or Extra Strength Naprosyn Uracel  Atromid-S Efficin Naproxen Ursinus  Auranofin Capsules Elmiron Neocylate Vanquish  Axotal Emagrin Norgesic Verin  Azathioprine Empirin or Empirin with Codeine Normiflo Vitamin E  Azolid Emprazil Nuprin Voltaren  Bayer Aspirin plain, buffered or children's or timed BC Tablets or powders Encaprin Orgaran Warfarin Sodium  Buff-a-Comp Enoxaparin Orudis Zorpin  Buff-a-Comp with Codeine Equegesic Os-Cal-Gesic   Buffaprin Excedrin plain, buffered or Extra Strength Oxalid   Bufferin Arthritis Strength Feldene Oxphenbutazone   Bufferin plain or Extra Strength Feldene Capsules Oxycodone with Aspirin   Bufferin with Codeine Fenoprofen Fenoprofen Pabalate or Pabalate-SF   Buffets II Flogesic Panagesic   Buffinol plain or Extra Strength Florinal or Florinal with Codeine Panwarfarin   Buf-Tabs Flurbiprofen Penicillamine   Butalbital Compound Four-way cold tablets Penicillin   Butazolidin Fragmin Pepto-Bismol   Carbenicillin Geminisyn Percodan   Carna Arthritis Reliever Geopen Persantine   Carprofen Gold's salt Persistin   Chloramphenicol Goody's Phenylbutazone   Chloromycetin Haltrain Piroxlcam   Clmetidine heparin Plaquenil    Cllnoril Hyco-pap Ponstel   Clofibrate Hydroxy chloroquine Propoxyphen         Before stopping any of these medications, be sure to consult the physician who ordered them.  Some, such as Coumadin (Warfarin) are ordered to prevent or treat serious conditions such as "deep thrombosis", "pumonary embolisms", and other heart problems.  The amount of time that you may need off of the medication may also vary with the medication and the reason for which you were taking it.  If you are taking any of these medications, please make sure you notify your pain physician before you undergo any procedures.

## 2016-02-08 NOTE — Progress Notes (Signed)
Subjective:    Patient ID: [name], female    DOB: 05-Dec-1976, 40 y.o.   MRN: [id number]  HPI  PROCEDURE:  Block of nerves to the sacroiliac joint.   NOTE:  The patient is a 40 y.o. female who returns to the [facility] for further evaluation and treatment of pain involving the lower back and lower extremity region with pain in the region of the buttocks as well. Prior MRI revealed degenerative disc disease lumbar spine. . The patient is with reproduction of severe pain with palpation over the PSIS and PII S regions  There is concern regarding a significant component of the patient's pain being due to sacroiliac joint dysfunction The risks, benefits, expectations of the procedure have been discussed and explained to the patient who is understanding and willing to proceed with interventional treatment in attempt to decrease severity of patient's symptoms, minimize the risk of medication escalation and  hopefully retard the progression of the patient's symptoms. We will proceed with what is felt to be a medically necessary procedure, block of nerves to the sacroiliac joint.   DESCRIPTION OF PROCEDURE:  Block of nerves to the sacroiliac joint.   The patient was taken to the fluoroscopy suite. With the patient in the prone position with EKG, blood pressure, pulse and pulse oximetry monitoring, IV Versed, IV fentanyl conscious sedation, Betadine prep of proposed entry site was performed.   Block of nerves at the L5 vertebral body level.   With the patient in prone position, under fluoroscopic guidance, a 22 -gauge needle was inserted at the L5 vertebral body level on the left side. With 15 degrees oblique orientation a 22 -gauge needle was inserted in the region known as Burton's eye or eye of the Scotty dog. Following documentation of needle placement in the area of Burton's eye or eye of the Scotty dog under fluoroscopic guidance, needle placement was then accomplished at the sacral  ala level on the left side.   Needle placement at the sacral ala.   With the patient in prone position under fluoroscopic guidance with AP view of the lumbosacral spine, a 22 -gauge needle was inserted in the region known as the sacral ala on the left side. Following documentation of needle placement on the left side under fluoroscopic guidance needle placement was then accomplished at the S1 foramen level.   Needle placement at the S1 foramen level.   With the patient in prone position under fluoroscopic guidance with AP view of the lumbosacral spine and cephalad orientation, a 22 -gauge needle was inserted at the superior and lateral border of the S1 foramen on the left side. Following documentation of needle placement at the S1 foramen level on the left side, needle placement was then accomplished at the S2 foramen level on the left side.   Needle placement at the S2 foramen level.   With the patient in prone position with AP view of the lumbosacral spine with cephalad orientation, a 22 - gauge needle was inserted at the superior and lateral border of the S2 foramen under fluoroscopic guidance on the left side. Following needle placement at the L5 vertebral body level, sacral ala, S1 foramen and S2 foramen on the left left side, needle placement was verified on lateral view under fluoroscopic guidance.  Following needle placement documentation on lateral view, each needle was injected with 1 mL of 0.25% bupivacaine and Kenalog.   BLOCK OF THE NERVES TO SACROILIAC JOINT ON THE RIGHT SIDE The  procedure was performed on the right side at the same levels as was performed on the left side and utilizing the same technique as on the left side and was performed under fluoroscopic guidance as on the left side   A total of  of Kenalog was utilized for the procedure.   PLAN:  1. Medications: The patient will continue presently prescribed medications Neurontin and hydrocodone acetaminophen  2. The  patient will be considered for modification of treatment regimen pending response to the procedure performed on today's visit.  3. The patient is to follow-up with primary care physician Dr. Rolm Gala for evaluation of blood pressure and general medical condition following the procedure performed on today's visit.  4. Surgical evaluation as discussed. Has been addressed. Patient will follow-up with Dr.Zolnoun at [facility] as planned  5. Neurological evaluation as discussed. . May consider PNCV EMG studies 6. The patient may be a candidate for radiofrequency procedures, implantation devices and other treatment pending response to treatment performed on today's visit and follow-up evaluation.  7. The patient has been advised to adhere to proper body mechanics and to avoid activities which may exacerbate the patient's symptoms.   Return appointment to [facility] as scheduled.    Review of Systems     Objective:   Physical Exam        Assessment & Plan:

## 2016-02-09 ENCOUNTER — Telehealth: Payer: Self-pay | Admitting: *Deleted

## 2016-02-09 NOTE — Telephone Encounter (Signed)
Patient verbalizes no c/o from procedure on yesterday. 

## 2016-02-14 ENCOUNTER — Encounter: Payer: Medicare Other | Admitting: Pain Medicine

## 2016-03-07 ENCOUNTER — Ambulatory Visit: Payer: Medicare Other | Attending: Pain Medicine | Admitting: Pain Medicine

## 2016-03-07 ENCOUNTER — Encounter: Payer: Self-pay | Admitting: Pain Medicine

## 2016-03-07 VITALS — BP 107/50 | HR 65 | Temp 97.8°F | Resp 16 | Ht 64.0 in | Wt 265.0 lb

## 2016-03-07 DIAGNOSIS — N809 Endometriosis, unspecified: Secondary | ICD-10-CM | POA: Insufficient documentation

## 2016-03-07 DIAGNOSIS — M461 Sacroiliitis, not elsewhere classified: Secondary | ICD-10-CM

## 2016-03-07 DIAGNOSIS — M25559 Pain in unspecified hip: Secondary | ICD-10-CM

## 2016-03-07 DIAGNOSIS — M47816 Spondylosis without myelopathy or radiculopathy, lumbar region: Secondary | ICD-10-CM

## 2016-03-07 DIAGNOSIS — M533 Sacrococcygeal disorders, not elsewhere classified: Secondary | ICD-10-CM | POA: Insufficient documentation

## 2016-03-07 DIAGNOSIS — G588 Other specified mononeuropathies: Secondary | ICD-10-CM | POA: Insufficient documentation

## 2016-03-07 DIAGNOSIS — M5136 Other intervertebral disc degeneration, lumbar region: Secondary | ICD-10-CM | POA: Diagnosis not present

## 2016-03-07 DIAGNOSIS — M545 Low back pain: Secondary | ICD-10-CM | POA: Diagnosis present

## 2016-03-07 DIAGNOSIS — M47818 Spondylosis without myelopathy or radiculopathy, sacral and sacrococcygeal region: Secondary | ICD-10-CM

## 2016-03-07 DIAGNOSIS — R102 Pelvic and perineal pain: Secondary | ICD-10-CM | POA: Insufficient documentation

## 2016-03-07 DIAGNOSIS — M79606 Pain in leg, unspecified: Secondary | ICD-10-CM | POA: Diagnosis present

## 2016-03-07 DIAGNOSIS — N803 Endometriosis of pelvic peritoneum, unspecified: Secondary | ICD-10-CM

## 2016-03-07 MED ORDER — HYDROCODONE-ACETAMINOPHEN 7.5-325 MG/15ML PO SOLN: ORAL | Status: DC

## 2016-03-07 MED ORDER — GABAPENTIN 100 MG PO CAPS: 100.0000 mg | ORAL_CAPSULE | Freq: Three times a day (TID) | ORAL | Status: DC

## 2016-03-07 MED ORDER — CYCLOBENZAPRINE HCL 10 MG PO TABS: ORAL_TABLET | ORAL | Status: DC

## 2016-03-07 NOTE — Progress Notes (Signed)
   Subjective:    Patient ID: [name] DakinsShannon D Frenkel, female    DOB: 10/30/1976, 40 y.o.   MRN: [id number]  HPI The patient is a 40 year old female who returns to pain management for further evaluation and treatment of pain involving the lower back and lower extremity region with history of pelvic pain is well. The patient is to follow-up with Dr.Zolnoun regarding her pelvic pain as discussed. The patient states that she tripped over her dog and that she has had exacerbation of pain involving the lower back and lower extremity region. Patient denies trauma to the head. The patient states that his been an increase in the lower back and lower extremity pain of significant degree. We will proceed with interventional treatment at time of return appointment and will consider patient for further studies and evaluations pending response to treatment and follow-up evaluation. The patient will continue Neurontin Flexeril and hydrocodone acetaminophen as prescribed. The patient was with understanding and agreed with suggested treatment plan.     Review of Systems     Objective:   Physical Exam  There was tenderness of the splenius capitis and occipitalis musculature regions palpation which reproduces pain of mild to moderate degree. There was mild tenderness over the region of the acromioclavicular and glenohumeral joint region. Palpation over the thoracic facet thoracic paraspinal musculature region was with mild to moderate discomfort. There was mild to moderate muscle spasms noted in the mid and lower thoracic paraspinal musculature region. The patient appeared to be with bilaterally equal grip strength and Tinel and Phalen's maneuver were without increase of pain of significant degree. Palpation over the lumbar paraspinal musculatures and lumbar facet region was associated with moderate discomfort with lateral bending rotation extension and palpation over the lumbar facets reproducing moderate discomfort. There  was moderate to moderately severe tenderness of the PSIS and PII S region as well as the gluteal and piriformis musculature regions. There was mild to moderate tenderness of the greater trochanteric region and iliotibial band region. Straight leg raise was tolerates approximately 20 without a definite increased pain with dorsiflexion noted. EHL strength appeared to be question decreased without sensory deficit or dermatomal dystrophy detected. There was negative clonus negative Homans. Reevaluation revealed patient to be with moderate to moderately severe tenderness of the PSIS and PII S region. Abdomen was nontender with no costovertebral tenderness noted       Assessment & Plan:      Degenerative disc disease lumbar spine  Lumbar facet syndrome  Ilioinguinal iliohypogastric genitofemoral neuralgia   Sacroiliac joint dysfunction  Endometriosis      PLAN   Continue present medication Neurontin Flexeril and hydrocodone acetaminophen  Block of nerves to the sacroiliac joint to be performed at time of return appointment  F/U PCP for evaliation of  BP and general medical  condition  F/U surgical evaluation. May consider pending follow-up evaluations  F/U neurological evaluation. May consider pending follow-up evaluations  Ask receptionist if you are already approved for radiofrequency of the lumbar facets  F/U Dr Orion CrookZolnoun as planned. Discuss ovarian function and pelvic pain at time of return appointment

## 2016-03-07 NOTE — Patient Instructions (Addendum)
PLAN   Continue present medication Neurontin Flexeril and hydrocodone acetaminophen  Block of nerves to the sacroiliac joint to be performed at time of return appointment  F/U PCP for evaliation of  BP and general medical  condition  F/U surgical evaluation. May consider pending follow-up evaluations  F/U neurological evaluation. May consider pending follow-up evaluations  Ask receptionist if you are already approved for radiofrequency of the lumbar facets  F/U Dr Orion CrookZolnoun as planned. Discuss ovarian function and pelvic pain at time of return appointmentSelective Nerve Root Block Patient Information  Description: Specific nerve roots exit the spinal canal and these nerves can be compressed and inflamed by a bulging disc and bone spurs.  By injecting steroids on the nerve root, we can potentially decrease the inflammation surrounding these nerves, which often leads to decreased pain.  Also, by injecting local anesthesia on the nerve root, this can provide us helpful information to give to your referring doctor if it decreases your pain.  Selective nerve root blocks can be done along the spine from the neck to the low back depending on the location of your pain.   After numbing the skin with local anesthesia, a small needle is passed to the nerve root and the position of the needle is verified using x-ray pictures.  After the needle is in correct position, we then deposit the medication.  You may experience a pressure sensation while this is being done.  The entire block usually lasts less than 15 minutes.  Conditions that may be treated with selective nerve root blocks:  Low back and leg pain  Spinal stenosis  Diagnostic block prior to potential surgery  Neck and arm pain  Post laminectomy syndrome  Preparation for the injection:  1. Do not eat any solid food or dairy products within 8 hours of your appointment. 2. You may drink clear liquids up to 3 hours before an appointment.   Clear liquids include water, black coffee, juice or soda.  No milk or cream please. 3. You may take your regular medications, including pain medications, with a sip of water before your appointment.  Diabetics should hold regular insulin (if taken separately) and take 1/2 normal NPH dose the morning of the procedure.  Carry some sugar containing items with you to your appointment. 4. A driver must accompany you and be prepared to drive you home after your procedure. 5. Bring all your current medications with you. 6. An IV may be inserted and sedation may be given at the discretion of the physician. 7. A blood pressure cuff, EKG, and other monitors will often be applied during the procedure.  Some patients may need to have extra oxygen administered for a short period. 8. You will be asked to provide medical information, including allergies, prior to the procedure.  We must know immediately if you are taking blood  Thinners (like Coumadin) or if you are allergic to IV iodine contrast (dye).  Possible side-effects: All are usually temporary  Bleeding from needle site  Light headedness  Numbness and tingling  Decreased blood pressure  Weakness in arms/legs  Pressure sensation in back/neck  Pain at injection site (several days)  Possible complications: All are extremely rare  Infection  Nerve injury  Spinal headache (a headache wore with upright position)  Call if you experience:  Fever/chills associated with headache or increased back/neck pain  Headache worsened by an upright position  New onset weakness or numbness of an extremity below the injection site  Hives  or difficulty breathing (go to the emergency room)  Inflammation or drainage at the injection site(s)  Severe back/neck pain greater than usual  New symptoms which are concerning to you  Please note:  Although the local anesthetic injected can often make your back or neck feel good for several hours after  the injection the pain will likely return.  It takes 3-5 days for steroids to work on the nerve root. You may not notice any pain relief for at least one week.  If effective, we will often do a series of 3 injections spaced 3-6 weeks apart to maximally decrease your pain.    If you have any questions, please call [phone] [facility] Dis[facility]  General Discharge Instructions :  If you need to reach your doctor call: Monday-Friday 8:00 am - 4:00 pm at [phone] or toll free [phone].  After clinic hours [phone] to have operator reach doctor.  Bring all of your medication bottles to all your appointments in the pain clinic.  To cancel or reschedule your appointment with Pain Management please remember to call 24 hours in advance to avoid a fee.  Refer to the educational materials which you have been given on: General Risks, I had my Procedure. Discharge Instructions, Post Sedation.  Post Procedure Instructions:  The drugs you were given will stay in your system until tomorrow, so for the next 24 hours you should not drive, make any legal decisions or drink any alcoholic beverages.  You may eat anything you prefer, but it is better to start with liquids then soups and crackers, and gradually work up to solid foods.  Please notify your doctor immediately if you have any unusual bleeding, trouble breathing or pain that is not related to your normal pain.  Depending on the type of procedure that was done, some parts of your body may feel week and/or numb.  This usually clears up by tonight or the next day.  Walk with the use of an assistive device or accompanied by an adult for the 24 hours.  You may use ice on the affected area for the first 24 hours.  Put ice in a Ziploc bag and cover with a towel and place against area 15 minutes on 15 minutes off.  You may switch to heat after 24 hours.

## 2016-03-07 NOTE — Progress Notes (Signed)
Medication management.   Safety precautions to be maintained throughout the outpatient stay will include: orient to surroundings, keep bed in low position, maintain call bell within reach at all times, provide assistance with transfer out of bed and ambulation.

## 2016-03-12 ENCOUNTER — Ambulatory Visit: Payer: Medicare Other | Attending: Pain Medicine | Admitting: Pain Medicine

## 2016-03-12 ENCOUNTER — Encounter: Payer: Self-pay | Admitting: Pain Medicine

## 2016-03-12 VITALS — BP 105/64 | HR 65 | Temp 97.1°F | Resp 17 | Ht 64.0 in | Wt 265.0 lb

## 2016-03-12 DIAGNOSIS — M47816 Spondylosis without myelopathy or radiculopathy, lumbar region: Secondary | ICD-10-CM | POA: Insufficient documentation

## 2016-03-12 DIAGNOSIS — M79606 Pain in leg, unspecified: Secondary | ICD-10-CM | POA: Diagnosis present

## 2016-03-12 DIAGNOSIS — M533 Sacrococcygeal disorders, not elsewhere classified: Secondary | ICD-10-CM

## 2016-03-12 DIAGNOSIS — M5136 Other intervertebral disc degeneration, lumbar region: Secondary | ICD-10-CM

## 2016-03-12 DIAGNOSIS — M545 Low back pain: Secondary | ICD-10-CM | POA: Diagnosis present

## 2016-03-12 DIAGNOSIS — N803 Endometriosis of pelvic peritoneum, unspecified: Secondary | ICD-10-CM

## 2016-03-12 DIAGNOSIS — M25559 Pain in unspecified hip: Secondary | ICD-10-CM

## 2016-03-12 DIAGNOSIS — M461 Sacroiliitis, not elsewhere classified: Secondary | ICD-10-CM

## 2016-03-12 DIAGNOSIS — M47818 Spondylosis without myelopathy or radiculopathy, sacral and sacrococcygeal region: Secondary | ICD-10-CM

## 2016-03-12 MED ORDER — ORPHENADRINE CITRATE 30 MG/ML IJ SOLN: 60.0000 mg | Freq: Once | INTRAMUSCULAR | Status: DC

## 2016-03-12 MED ORDER — MIDAZOLAM HCL 5 MG/5ML IJ SOLN
INTRAMUSCULAR | Status: AC
  Administered 2016-03-12: 5 mg via INTRAVENOUS
  Filled 2016-03-12: qty 5

## 2016-03-12 MED ORDER — BUPIVACAINE HCL (PF) 0.25 % IJ SOLN
INTRAMUSCULAR | Status: AC
  Administered 2016-03-12: 10:00:00
  Filled 2016-03-12: qty 30

## 2016-03-12 MED ORDER — TRIAMCINOLONE ACETONIDE 40 MG/ML IJ SUSP
INTRAMUSCULAR | Status: AC
  Administered 2016-03-12: 10:00:00
  Filled 2016-03-12: qty 1

## 2016-03-12 MED ORDER — ORPHENADRINE CITRATE 30 MG/ML IJ SOLN
INTRAMUSCULAR | Status: AC
  Administered 2016-03-12: 10:00:00
  Filled 2016-03-12: qty 2

## 2016-03-12 MED ORDER — BUPIVACAINE HCL (PF) 0.25 % IJ SOLN: 30.0000 mL | Freq: Once | INTRAMUSCULAR | Status: DC

## 2016-03-12 MED ORDER — FENTANYL CITRATE (PF) 100 MCG/2ML IJ SOLN: 100.0000 ug | Freq: Once | INTRAMUSCULAR | Status: DC

## 2016-03-12 MED ORDER — TRIAMCINOLONE ACETONIDE 40 MG/ML IJ SUSP: 40.0000 mg | Freq: Once | INTRAMUSCULAR | Status: DC

## 2016-03-12 MED ORDER — MIDAZOLAM HCL 5 MG/5ML IJ SOLN: 5.0000 mg | Freq: Once | INTRAMUSCULAR | Status: DC

## 2016-03-12 MED ORDER — LACTATED RINGERS IV SOLN
1000.0000 mL | INTRAVENOUS | Status: DC
Start: 2016-03-12 — End: 2019-01-27

## 2016-03-12 MED ORDER — FENTANYL CITRATE (PF) 100 MCG/2ML IJ SOLN
INTRAMUSCULAR | Status: AC
  Administered 2016-03-12: 100 ug via INTRAVENOUS
  Filled 2016-03-12: qty 2

## 2016-03-12 NOTE — Patient Instructions (Addendum)
PLAN   Continue present medication Neurontin Flexeril and hydrocodone acetaminophen  F/U PCP for evaliation of  BP and general medical  condition as discussed  F/U surgical evaluation. May consider pending follow-up evaluations  F/U neurological evaluation. May consider PNCV/EMG studies and other studies pending follow-up evaluations  F/U Dr Orion CrookZolnoun as planned  May consider radiofrequency rhizolysis or intraspinal procedures pending response to present treatment and F/U evaluation   Patient to call [facility] should patient have concerns prior to scheduled return appointment.GENERAL RISKS AND COMPLICATIONS  What are the risk, side effects and possible complications? Generally speaking, most procedures are safe.  However, with any procedure there are risks, side effects, and the possibility of complications.  The risks and complications are dependent upon the sites that are lesioned, or the type of nerve block to be performed.  The closer the procedure is to the spine, the more serious the risks are.  Great care is taken when placing the radio frequency needles, block needles or lesioning probes, but sometimes complications can occur. 1. Infection: Any time there is an injection through the skin, there is a risk of infection.  This is why sterile conditions are used for these blocks.  There are four possible types of infection. 1. Localized skin infection. 2. Central Nervous System Infection-This can be in the form of Meningitis, which can be deadly. 3. Epidural Infections-This can be in the form of an epidural abscess, which can cause pressure inside of the spine, causing compression of the spinal cord with subsequent paralysis. This would require an emergency surgery to decompress, and there are no guarantees that the patient would recover from the paralysis. 4. Discitis-This is an infection of the intervertebral discs.  It occurs in about 1% of discography procedures.  It is  difficult to treat and it may lead to surgery.        2. Pain: the needles have to go through skin and soft tissues, will cause soreness.       3. Damage to internal structures:  The nerves to be lesioned may be near blood vessels or    other nerves which can be potentially damaged.       4. Bleeding: Bleeding is more common if the patient is taking blood thinners such as  aspirin, Coumadin, Ticiid, Plavix, etc., or if he/she have some genetic predisposition  such as hemophilia. Bleeding into the spinal canal can cause compression of the spinal  cord with subsequent paralysis.  This would require an emergency surgery to  decompress and there are no guarantees that the patient would recover from the  paralysis.       5. Pneumothorax:  Puncturing of a lung is a possibility, every time a needle is introduced in  the area of the chest or upper back.  Pneumothorax refers to free air around the  collapsed lung(s), inside of the thoracic cavity (chest cavity).  Another two possible  complications related to a similar event would include: Hemothorax and Chylothorax.   These are variations of the Pneumothorax, where instead of air around the collapsed  lung(s), you may have blood or chyle, respectively.       6. Spinal headaches: They may occur with any procedures in the area of the spine.       7. Persistent CSF (Cerebro-Spinal Fluid) leakage: This is a rare problem, but may occur  with prolonged intrathecal or epidural catheters either due to the formation of a fistulous  track or a  dural tear.       8. Nerve damage: By working so close to the spinal cord, there is always a possibility of  nerve damage, which could be as serious as a permanent spinal cord injury with  paralysis.       9. Death:  Although rare, severe deadly allergic reactions known as "Anaphylactic  reaction" can occur to any of the medications used.      10. Worsening of the symptoms:  We can always make thing worse.  What are the chances of  something like this happening? Chances of any of this occuring are extremely low.  By statistics, you have more of a chance of getting killed in a motor vehicle accident: while driving to the hospital than any of the above occurring .  Nevertheless, you should be aware that they are possibilities.  In general, it is similar to taking a shower.  Everybody knows that you can slip, hit your head and get killed.  Does that mean that you should not shower again?  Nevertheless always keep in mind that statistics do not mean anything if you happen to be on the wrong side of them.  Even if a procedure has a 1 (one) in a 1,000,000 (million) chance of going wrong, it you happen to be that one..Also, keep in mind that by statistics, you have more of a chance of having something go wrong when taking medications.  Who should not have this procedure? If you are on a blood thinning medication (e.g. Coumadin, Plavix, see list of "Blood Thinners"), or if you have an active infection going on, you should not have the procedure.  If you are taking any blood thinners, please inform your physician.  How should I prepare for this procedure?  Do not eat or drink anything at least six hours prior to the procedure.  Bring a driver with you .  It cannot be a taxi.  Come accompanied by an adult that can drive you back, and that is strong enough to help you if your legs get weak or numb from the local anesthetic.  Take all of your medicines the morning of the procedure with just enough water to swallow them.  If you have diabetes, make sure that you are scheduled to have your procedure done first thing in the morning, whenever possible.  If you have diabetes, take only half of your insulin dose and notify our nurse that you have done so as soon as you arrive at the clinic.  If you are diabetic, but only take blood sugar pills (oral hypoglycemic), then do not take them on the morning of your procedure.  You may take them  after you have had the procedure.  Do not take aspirin or any aspirin-containing medications, at least eleven (11) days prior to the procedure.  They may prolong bleeding.  Wear loose fitting clothing that may be easy to take off and that you would not mind if it got stained with Betadine or blood.  Do not wear any jewelry or perfume  Remove any nail coloring.  It will interfere with some of our monitoring equipment.  NOTE: Remember that this is not meant to be interpreted as a complete list of all possible complications.  Unforeseen problems may occur.  BLOOD THINNERS The following drugs contain aspirin or other products, which can cause increased bleeding during surgery and should not be taken for 2 weeks prior to and 1 week after surgery.  If you  should need take something for relief of minor pain, you may take acetaminophen which is found in Tylenol,m Datril, Anacin-3 and Panadol. It is not blood thinner. The products listed below are.  Do not take any of the products listed below in addition to any listed on your instruction sheet.  A.P.C or A.P.C with Codeine Codeine Phosphate Capsules #3 Ibuprofen Ridaura  ABC compound Congesprin Imuran rimadil  Advil Cope Indocin Robaxisal  Alka-Seltzer Effervescent Pain Reliever and Antacid Coricidin or Coricidin-D  Indomethacin Rufen  Alka-Seltzer plus Cold Medicine Cosprin Ketoprofen S-A-C Tablets  Anacin Analgesic Tablets or Capsules Coumadin Korlgesic Salflex  Anacin Extra Strength Analgesic tablets or capsules CP-2 Tablets Lanoril Salicylate  Anaprox Cuprimine Capsules Levenox Salocol  Anexsia-D Dalteparin Magan Salsalate  Anodynos Darvon compound Magnesium Salicylate Sine-off  Ansaid Dasin Capsules Magsal Sodium Salicylate  Anturane Depen Capsules Marnal Soma  APF Arthritis pain formula Dewitt's Pills Measurin Stanback  Argesic Dia-Gesic Meclofenamic Sulfinpyrazone  Arthritis Bayer Timed Release Aspirin Diclofenac Meclomen Sulindac   Arthritis pain formula Anacin Dicumarol Medipren Supac  Analgesic (Safety coated) Arthralgen Diffunasal Mefanamic Suprofen  Arthritis Strength Bufferin Dihydrocodeine Mepro Compound Suprol  Arthropan liquid Dopirydamole Methcarbomol with Aspirin Synalgos  ASA tablets/Enseals Disalcid Micrainin Tagament  Ascriptin Doan's Midol Talwin  Ascriptin A/D Dolene Mobidin Tanderil  Ascriptin Extra Strength Dolobid Moblgesic Ticlid  Ascriptin with Codeine Doloprin or Doloprin with Codeine Momentum Tolectin  Asperbuf Duoprin Mono-gesic Trendar  Aspergum Duradyne Motrin or Motrin IB Triminicin  Aspirin plain, buffered or enteric coated Durasal Myochrisine Trigesic  Aspirin Suppositories Easprin Nalfon Trillsate  Aspirin with Codeine Ecotrin Regular or Extra Strength Naprosyn Uracel  Atromid-S Efficin Naproxen Ursinus  Auranofin Capsules Elmiron Neocylate Vanquish  Axotal Emagrin Norgesic Verin  Azathioprine Empirin or Empirin with Codeine Normiflo Vitamin E  Azolid Emprazil Nuprin Voltaren  Bayer Aspirin plain, buffered or children's or timed BC Tablets or powders Encaprin Orgaran Warfarin Sodium  Buff-a-Comp Enoxaparin Orudis Zorpin  Buff-a-Comp with Codeine Equegesic Os-Cal-Gesic   Buffaprin Excedrin plain, buffered or Extra Strength Oxalid   Bufferin Arthritis Strength Feldene Oxphenbutazone   Bufferin plain or Extra Strength Feldene Capsules Oxycodone with Aspirin   Bufferin with Codeine Fenoprofen Fenoprofen Pabalate or Pabalate-SF   Buffets II Flogesic Panagesic   Buffinol plain or Extra Strength Florinal or Florinal with Codeine Panwarfarin   Buf-Tabs Flurbiprofen Penicillamine   Butalbital Compound Four-way cold tablets Penicillin   Butazolidin Fragmin Pepto-Bismol   Carbenicillin Geminisyn Percodan   Carna Arthritis Reliever Geopen Persantine   Carprofen Gold's salt Persistin   Chloramphenicol Goody's Phenylbutazone   Chloromycetin Haltrain Piroxlcam   Clmetidine heparin Plaquenil    Cllnoril Hyco-pap Ponstel   Clofibrate Hydroxy chloroquine Propoxyphen         Before stopping any of these medications, be sure to consult the physician who ordered them.  Some, such as Coumadin (Warfarin) are ordered to prevent or treat serious conditions such as "deep thrombosis", "pumonary embolisms", and other heart problems.  The amount of time that you may need off of the medication may also vary with the medication and the reason for which you were taking it.  If you are taking any of these medications, please make sure you notify your pain physician before you undergo any procedures.         GENERAL RISKS AND COMPLICATIONS  What are the risk, side effects and possible complications? Generally speaking, most procedures are safe.  However, with any procedure there are risks, side effects, and the possibility of complications.  The risks and complications are dependent upon the sites that are lesioned, or the type of nerve block to be performed.  The closer the procedure is to the spine, the more serious the risks are.  Great care is taken when placing the radio frequency needles, block needles or lesioning probes, but sometimes complications can occur. 2. Infection: Any time there is an injection through the skin, there is a risk of infection.  This is why sterile conditions are used for these blocks.  There are four possible types of infection. 1. Localized skin infection. 2. Central Nervous System Infection-This can be in the form of Meningitis, which can be deadly. 3. Epidural Infections-This can be in the form of an epidural abscess, which can cause pressure inside of the spine, causing compression of the spinal cord with subsequent paralysis. This would require an emergency surgery to decompress, and there are no guarantees that the patient would recover from the paralysis. 4. Discitis-This is an infection of the intervertebral discs.  It occurs in about 1% of discography  procedures.  It is difficult to treat and it may lead to surgery.        2. Pain: the needles have to go through skin and soft tissues, will cause soreness.       3. Damage to internal structures:  The nerves to be lesioned may be near blood vessels or    other nerves which can be potentially damaged.       4. Bleeding: Bleeding is more common if the patient is taking blood thinners such as  aspirin, Coumadin, Ticiid, Plavix, etc., or if he/she have some genetic predisposition  such as hemophilia. Bleeding into the spinal canal can cause compression of the spinal  cord with subsequent paralysis.  This would require an emergency surgery to  decompress and there are no guarantees that the patient would recover from the  paralysis.       5. Pneumothorax:  Puncturing of a lung is a possibility, every time a needle is introduced in  the area of the chest or upper back.  Pneumothorax refers to free air around the  collapsed lung(s), inside of the thoracic cavity (chest cavity).  Another two possible  complications related to a similar event would include: Hemothorax and Chylothorax.   These are variations of the Pneumothorax, where instead of air around the collapsed  lung(s), you may have blood or chyle, respectively.       6. Spinal headaches: They may occur with any procedures in the area of the spine.       7. Persistent CSF (Cerebro-Spinal Fluid) leakage: This is a rare problem, but may occur  with prolonged intrathecal or epidural catheters either due to the formation of a fistulous  track or a dural tear.       8. Nerve damage: By working so close to the spinal cord, there is always a possibility of  nerve damage, which could be as serious as a permanent spinal cord injury with  paralysis.       9. Death:  Although rare, severe deadly allergic reactions known as "Anaphylactic  reaction" can occur to any of the medications used.      10. Worsening of the symptoms:  We can always make thing worse.  What  are the chances of something like this happening? Chances of any of this occuring are extremely low.  By statistics, you have more of a chance of getting killed in a motor vehicle  accident: while driving to the hospital than any of the above occurring .  Nevertheless, you should be aware that they are possibilities.  In general, it is similar to taking a shower.  Everybody knows that you can slip, hit your head and get killed.  Does that mean that you should not shower again?  Nevertheless always keep in mind that statistics do not mean anything if you happen to be on the wrong side of them.  Even if a procedure has a 1 (one) in a 1,000,000 (million) chance of going wrong, it you happen to be that one..Also, keep in mind that by statistics, you have more of a chance of having something go wrong when taking medications.  Who should not have this procedure? If you are on a blood thinning medication (e.g. Coumadin, Plavix, see list of "Blood Thinners"), or if you have an active infection going on, you should not have the procedure.  If you are taking any blood thinners, please inform your physician.  How should I prepare for this procedure?  Do not eat or drink anything at least six hours prior to the procedure.  Bring a driver with you .  It cannot be a taxi.  Come accompanied by an adult that can drive you back, and that is strong enough to help you if your legs get weak or numb from the local anesthetic.  Take all of your medicines the morning of the procedure with just enough water to swallow them.  If you have diabetes, make sure that you are scheduled to have your procedure done first thing in the morning, whenever possible.  If you have diabetes, take only half of your insulin dose and notify our nurse that you have done so as soon as you arrive at the clinic.  If you are diabetic, but only take blood sugar pills (oral hypoglycemic), then do not take them on the morning of your procedure.   You may take them after you have had the procedure.  Do not take aspirin or any aspirin-containing medications, at least eleven (11) days prior to the procedure.  They may prolong bleeding.  Wear loose fitting clothing that may be easy to take off and that you would not mind if it got stained with Betadine or blood.  Do not wear any jewelry or perfume  Remove any nail coloring.  It will interfere with some of our monitoring equipment.  NOTE: Remember that this is not meant to be interpreted as a complete list of all possible complications.  Unforeseen problems may occur.  BLOOD THINNERS The following drugs contain aspirin or other products, which can cause increased bleeding during surgery and should not be taken for 2 weeks prior to and 1 week after surgery.  If you should need take something for relief of minor pain, you may take acetaminophen which is found in Tylenol,m Datril, Anacin-3 and Panadol. It is not blood thinner. The products listed below are.  Do not take any of the products listed below in addition to any listed on your instruction sheet.  A.P.C or A.P.C with Codeine Codeine Phosphate Capsules #3 Ibuprofen Ridaura  ABC compound Congesprin Imuran rimadil  Advil Cope Indocin Robaxisal  Alka-Seltzer Effervescent Pain Reliever and Antacid Coricidin or Coricidin-D  Indomethacin Rufen  Alka-Seltzer plus Cold Medicine Cosprin Ketoprofen S-A-C Tablets  Anacin Analgesic Tablets or Capsules Coumadin Korlgesic Salflex  Anacin Extra Strength Analgesic tablets or capsules CP-2 Tablets Lanoril Salicylate  Anaprox Cuprimine Capsules Levenox Salocol  Anexsia-D Dalteparin Magan Salsalate  Anodynos Darvon compound Magnesium Salicylate Sine-off  Ansaid Dasin Capsules Magsal Sodium Salicylate  Anturane Depen Capsules Marnal Soma  APF Arthritis pain formula Dewitt's Pills Measurin Stanback  Argesic Dia-Gesic Meclofenamic Sulfinpyrazone  Arthritis Bayer Timed Release Aspirin Diclofenac  Meclomen Sulindac  Arthritis pain formula Anacin Dicumarol Medipren Supac  Analgesic (Safety coated) Arthralgen Diffunasal Mefanamic Suprofen  Arthritis Strength Bufferin Dihydrocodeine Mepro Compound Suprol  Arthropan liquid Dopirydamole Methcarbomol with Aspirin Synalgos  ASA tablets/Enseals Disalcid Micrainin Tagament  Ascriptin Doan's Midol Talwin  Ascriptin A/D Dolene Mobidin Tanderil  Ascriptin Extra Strength Dolobid Moblgesic Ticlid  Ascriptin with Codeine Doloprin or Doloprin with Codeine Momentum Tolectin  Asperbuf Duoprin Mono-gesic Trendar  Aspergum Duradyne Motrin or Motrin IB Triminicin  Aspirin plain, buffered or enteric coated Durasal Myochrisine Trigesic  Aspirin Suppositories Easprin Nalfon Trillsate  Aspirin with Codeine Ecotrin Regular or Extra Strength Naprosyn Uracel  Atromid-S Efficin Naproxen Ursinus  Auranofin Capsules Elmiron Neocylate Vanquish  Axotal Emagrin Norgesic Verin  Azathioprine Empirin or Empirin with Codeine Normiflo Vitamin E  Azolid Emprazil Nuprin Voltaren  Bayer Aspirin plain, buffered or children's or timed BC Tablets or powders Encaprin Orgaran Warfarin Sodium  Buff-a-Comp Enoxaparin Orudis Zorpin  Buff-a-Comp with Codeine Equegesic Os-Cal-Gesic   Buffaprin Excedrin plain, buffered or Extra Strength Oxalid   Bufferin Arthritis Strength Feldene Oxphenbutazone   Bufferin plain or Extra Strength Feldene Capsules Oxycodone with Aspirin   Bufferin with Codeine Fenoprofen Fenoprofen Pabalate or Pabalate-SF   Buffets II Flogesic Panagesic   Buffinol plain or Extra Strength Florinal or Florinal with Codeine Panwarfarin   Buf-Tabs Flurbiprofen Penicillamine   Butalbital Compound Four-way cold tablets Penicillin   Butazolidin Fragmin Pepto-Bismol   Carbenicillin Geminisyn Percodan   Carna Arthritis Reliever Geopen Persantine   Carprofen Gold's salt Persistin   Chloramphenicol Goody's Phenylbutazone   Chloromycetin Haltrain Piroxlcam   Clmetidine  heparin Plaquenil   Cllnoril Hyco-pap Ponstel   Clofibrate Hydroxy chloroquine Propoxyphen         Before stopping any of these medications, be sure to consult the physician who ordered them.  Some, such as Coumadin (Warfarin) are ordered to prevent or treat serious conditions such as "deep thrombosis", "pumonary embolisms", and other heart problems.  The amount of time that you may need off of the medication may also vary with the medication and the reason for which you were taking it.  If you are taking any of these medications, please make sure you notify your pain physician before you undergo any procedures.

## 2016-03-12 NOTE — Progress Notes (Signed)
Subjective:    Patient ID: [name], female    DOB: 05/04/1976, 40 y.o.   MRN: [id number]  HPI PROCEDURE:  Block of nerves to the sacroiliac joint.   NOTE:  The patient is a 40 y.o. female who [facility] for further evaluation and treatment of pain involving the lower back and lower extremity region with pain in the region of the buttocks as well. Prior studies reveal multilevel degenerative changes of the lumbar spine. The patient is with severe tenderness to palpation of the PSIS and PII S regions. The patient also is with positive Patrick's maneuver .   There is concern regarding a significant component of the patient's pain being due to sacroiliac joint dysfunction The risks, benefits, expectations of the procedure have been discussed and explained to the patient who is understanding and willing to proceed with interventional treatment in attempt to decrease severity of patient's symptoms, minimize the risk of medication escalation and  hopefully retard the progression of the patient's symptoms. We will proceed with what is felt to be a medically necessary procedure, block of nerves to the sacroiliac joint.   DESCRIPTION OF PROCEDURE:  Block of nerves to the sacroiliac joint.   The patient was taken to the fluoroscopy suite. With the patient in the prone position with EKG, blood pressure, pulse and pulse oximetry monitoring, IV Versed, IV fentanyl conscious sedation, Betadine prep of proposed entry site was performed.   Block of nerves at the L5 vertebral body level.   With the patient in prone position, under fluoroscopic guidance, a 22 -gauge needle was inserted at the L5 vertebral body level on the left side. With 15 degrees oblique orientation a 22 -gauge needle was inserted in the region known as Burton's eye or eye of the Scotty dog. Following documentation of needle placement in the area of Burton's eye or eye of the Scotty dog under fluoroscopic  guidance, needle placement was then accomplished at the sacral ala level on the left side.   Needle placement at the sacral ala.   With the patient in prone position under fluoroscopic guidance with AP view of the lumbosacral spine, a 22 -gauge needle was inserted in the region known as the sacral ala on the left side. Following documentation of needle placement on the left side under fluoroscopic guidance needle placement was then accomplished at the S1 foramen level.   Needle placement at the S1 foramen level.   With the patient in prone position under fluoroscopic guidance with AP view of the lumbosacral spine and cephalad orientation, a 22 -gauge needle was inserted at the superior and lateral border of the S1 foramen on the left side. Following documentation of needle placement at the S1 foramen level on the left side, needle placement was then accomplished at the S2 foramen level on the left side.   Needle placement at the S2 foramen level.   With the patient in prone position with AP view of the lumbosacral spine with cephalad orientation, a 22 - gauge needle was inserted at the superior and lateral border of the S2 foramen under fluoroscopic guidance on the left side. Following needle placement at the L5 vertebral body level, sacral ala, S1 foramen and S2 foramen on the left side, needle placement was verified on lateral view under fluoroscopic guidance.  Following needle placement documentation on lateral view, each needle was injected with 1 mL of 0.25% bupivacaine and Kenalog.   BLOCK OF THE NERVES TO  SACROILIAC JOINT ON THE RIGHT SIDE The procedure was performed on the right side at the same levels as was performed on the left side and utilizing the same technique as on the left side and was performed under fluoroscopic guidance as on the left side  Myoneural block injections of the gluteal musculature region Following Betadine prep of proposed entry site a 22-gauge needle was inserted  in the gluteal musculature region and following negative aspiration 2 cc of 0.25% bupivacaine with Norflex was injected for myoneural block injection of the gluteal musculature region times two.  The patient tolerated the procedure well   A total of  of Kenalog was utilized for the procedure.   PLAN:  1. Medications: The patient will continue presently prescribed medications. Neurontin Flexeril and hydrocodone acetaminophen 2. The patient will be considered for modification of treatment regimen pending response to the procedure performed on today's visit.  3. The patient is to follow-up with primary care physician Dr. Rolm Gala  for evaluation of blood pressure and general medical condition following the procedure performed on today's visit.  4. Surgical evaluation as discussed.  5. Neurological evaluation as discussed. 6. F/U Dr. Orion Crook as discussed  7. The patient may be a candidate for radiofrequency procedures, implantation devices and other treatment pending response to treatment performed on today's visit and follow-up evaluation.  8. The patient has been advised to adhere to proper body mechanics and to avoid activities which may exacerbate the patient's symptoms.   [facility] appointment to [facility] as scheduled.     Review of Systems     Objective:   Physical Exam        Assessment & Plan:

## 2016-03-12 NOTE — Progress Notes (Signed)
Patient here for procedure for low back pain. Safety precautions to be maintained throughout the outpatient stay will include: orient to surroundings, keep bed in low position, maintain call bell within reach at all times, provide assistance with transfer out of bed and ambulation.

## 2016-03-13 ENCOUNTER — Telehealth: Payer: Self-pay | Admitting: *Deleted

## 2016-03-13 NOTE — Telephone Encounter (Signed)
No problems post procedure. 

## 2016-03-15 ENCOUNTER — Other Ambulatory Visit: Payer: Self-pay | Admitting: Pain Medicine

## 2016-04-03 ENCOUNTER — Encounter: Payer: Medicare Other | Admitting: Pain Medicine

## 2016-04-05 ENCOUNTER — Encounter: Payer: Self-pay | Admitting: Pain Medicine

## 2016-04-05 ENCOUNTER — Ambulatory Visit: Payer: Medicare Other | Attending: Pain Medicine | Admitting: Pain Medicine

## 2016-04-05 VITALS — BP 95/63 | HR 67 | Temp 98.0°F | Resp 15 | Ht 64.0 in | Wt 265.0 lb

## 2016-04-05 DIAGNOSIS — M792 Neuralgia and neuritis, unspecified: Secondary | ICD-10-CM | POA: Insufficient documentation

## 2016-04-05 DIAGNOSIS — M47818 Spondylosis without myelopathy or radiculopathy, sacral and sacrococcygeal region: Secondary | ICD-10-CM

## 2016-04-05 DIAGNOSIS — N803 Endometriosis of pelvic peritoneum, unspecified: Secondary | ICD-10-CM

## 2016-04-05 DIAGNOSIS — M47816 Spondylosis without myelopathy or radiculopathy, lumbar region: Secondary | ICD-10-CM

## 2016-04-05 DIAGNOSIS — M545 Low back pain: Secondary | ICD-10-CM | POA: Diagnosis present

## 2016-04-05 DIAGNOSIS — M546 Pain in thoracic spine: Secondary | ICD-10-CM | POA: Diagnosis present

## 2016-04-05 DIAGNOSIS — M533 Sacrococcygeal disorders, not elsewhere classified: Secondary | ICD-10-CM | POA: Insufficient documentation

## 2016-04-05 DIAGNOSIS — R1013 Epigastric pain: Secondary | ICD-10-CM

## 2016-04-05 DIAGNOSIS — M5136 Other intervertebral disc degeneration, lumbar region: Secondary | ICD-10-CM | POA: Insufficient documentation

## 2016-04-05 DIAGNOSIS — N809 Endometriosis, unspecified: Secondary | ICD-10-CM | POA: Diagnosis not present

## 2016-04-05 DIAGNOSIS — M25559 Pain in unspecified hip: Secondary | ICD-10-CM

## 2016-04-05 DIAGNOSIS — M461 Sacroiliitis, not elsewhere classified: Secondary | ICD-10-CM

## 2016-04-05 MED ORDER — HYDROCODONE-ACETAMINOPHEN 7.5-325 MG/15ML PO SOLN: ORAL | Status: DC

## 2016-04-05 NOTE — Progress Notes (Signed)
Safety precautions to be maintained throughout the outpatient stay will include: orient to surroundings, keep bed in low position, maintain call bell within reach at all times, provide assistance with transfer out of bed and ambulation.  

## 2016-04-05 NOTE — Patient Instructions (Addendum)
PLAN   Continue present medication Neurontin Flexeril and hydrocodone acetaminophen  Block of nerves to sacroiliac joint to be performed at time return appointment  F/U PCP for evaliation of  BP and general medical  condition  F/U surgical evaluation. May consider pending follow-up evaluations  F/U neurological evaluation. May consider pending follow-up evaluations  Ask receptionist if you are already approved for radiofrequency of the lumbar facets  F/U Dr Orion CrookZolnoun as planned. Discuss ovarian function and pelvic pain at time of return appointment  May consider radiofrequency rhizolysis and other procedures pending follow-up evaluations  Patient is to call pain management prior to scheduled return appointment for any concernsSelective Nerve Root Block Patient Information  Description: Specific nerve roots exit the spinal canal and these nerves can be compressed and inflamed by a bulging disc and bone spurs.  By injecting steroids on the nerve root, we can potentially decrease the inflammation surrounding these nerves, which often leads to decreased pain.  Also, by injecting local anesthesia on the nerve root, this can provide us helpful information to give to your referring doctor if it decreases your pain.  Selective nerve root blocks can be done along the spine from the neck to the low back depending on the location of your pain.   After numbing the skin with local anesthesia, a small needle is passed to the nerve root and the position of the needle is verified using x-ray pictures.  After the needle is in correct position, we then deposit the medication.  You may experience a pressure sensation while this is being done.  The entire block usually lasts less than 15 minutes.  Conditions that may be treated with selective nerve root blocks:  Low back and leg pain  Spinal stenosis  Diagnostic block prior to potential surgery  Neck and arm pain  Post laminectomy  syndrome  Preparation for the injection:  1. Do not eat any solid food or dairy products within 8 hours of your appointment. 2. You may drink clear liquids up to 3 hours before an appointment.  Clear liquids include water, black coffee, juice or soda.  No milk or cream please. 3. You may take your regular medications, including pain medications, with a sip of water before your appointment.  Diabetics should hold regular insulin (if taken separately) and take 1/2 normal NPH dose the morning of the procedure.  Carry some sugar containing items with you to your appointment. 4. A driver must accompany you and be prepared to drive you home after your procedure. 5. Bring all your current medications with you. 6. An IV may be inserted and sedation may be given at the discretion of the physician. 7. A blood pressure cuff, EKG, and other monitors will often be applied during the procedure.  Some patients may need to have extra oxygen administered for a short period. 8. You will be asked to provide medical information, including allergies, prior to the procedure.  We must know immediately if you are taking blood  Thinners (like Coumadin) or if you are allergic to IV iodine contrast (dye).  Possible side-effects: All are usually temporary  Bleeding from needle site  Light headedness  Numbness and tingling  Decreased blood pressure  Weakness in arms/legs  Pressure sensation in back/neck  Pain at injection site (several days)  Possible complications: All are extremely rare  Infection  Nerve injury  Spinal headache (a headache wore with upright position)  Call if you experience:  Fever/chills associated with headache or increased  back/neck pain  Headache worsened by an upright position  New onset weakness or numbness of an extremity below the injection site  Hives or difficulty breathing (go to the emergency room)  Inflammation or drainage at the injection site(s)  Severe  back/neck pain greater than usual  New symptoms which are concerning to you  Please note:  Although the local anesthetic injected can often make your back or neck feel good for several hours after the injection the pain will likely return.  It takes 3-5 days for steroids to work on the nerve root. You may not notice any pain relief for at least one week.  If effective, we will often do a series of 3 injections spaced 3-6 weeks apart to maximally decrease your pain.    If you have any questions, please call [phone] [facility]

## 2016-04-05 NOTE — Progress Notes (Signed)
Subjective:    Patient ID: [name], female    DOB: 02-Dec-1976, 40 y.o.   MRN: [id number]  HPI  The patient is a 40 year old female who returns to pain management for further evaluation and treatment of pain involving the mid back lower back and lower extremity region. The patient admits to severe pain of the lumbar region radiating to the buttocks on the left as well as on the right with pain occurring in the region of the mid back region with muscle spasms of severe degree. Patient has attempted to do more chores around the house and has had exacerbation of pain. We discussed patient's condition and will proceed with block of nerves to the sacroiliac joint at time of return appointment. The patient continues Neurontin Flexeril and hydrocodone acetaminophen and continues to have significant pain despite present treatment regimen we have discussed patient's condition and will proceed with block of nerves to the sacroiliac joint at time of return appointment and will consider myoneural block injections of the thoracic region for muscle spasms of the thoracic region as discussed and as explained to patient on today's visit. All agreed to suggested treatment plan    Review of Systems     Objective:   Physical Exam  There was tenderness to palpation of the splenius capitis and occipitalis musculature region a mild degree with mild tenderness over the cervical facet cervical paraspinal musculature region. Palpation over the region of the acromioclavicular and glenohumeral joint regions reproduced pain of mild to moderate degree with severe tends to palpation over the trapezius levator scapula and rhomboid musculature regions palpation which reproduces severe pain associated with severe muscle spasm. There was no crepitus of the thoracic region noted. Tinel and Phalen's maneuver were without increased pain of significant degree and patient appeared to be with bilaterally equal grip strength.  Palpation over the lumbar paraspinal musculatures and lumbar facet region was with moderate tenderness to palpation with moderate to moderately severe tenderness to palpation over the PSIS and PII S regions. Gluteal and piriformis musculature regions reproduced pain of moderate degree with moderate tenderness over the left and right gluteal and piriformis musculature regions. There was moderate tenderness of the greater trochanteric region and iliotibial band region. Straight leg raising was tolerates approximately 30 with increase of pain with Patrick's maneuver noted as well. EHL strength appeared to be decreased without a definite sensory deficit or dermatomal distribution detected. There was negative clonus negative Homans DTRs were difficult to elicit patient had difficulty relaxing and appeared to be trace at the knees. Abdomen was with mild lower quadrant abdominal tenderness to palpation with no costovertebral angle tenderness noted      Assessment & Plan:    Degenerative disc disease lumbar spine  Lumbar facet syndrome  Ilioinguinal iliohypogastric genitofemoral neuralgia   Sacroiliac joint dysfunction  Intercostal neuralgia  Endometriosis      PLAN   Continue present medication Neurontin Flexeril and hydrocodone acetaminophen  Block of nerves to sacroiliac joint to be performed at time return appointment  F/U PCP for evaliation of  BP and general medical  condition  F/U surgical evaluation. May consider pending follow-up evaluations  F/U neurological evaluation. May consider pending follow-up evaluations  Ask receptionist if you are already approved for radiofrequency of the lumbar facets  F/U Dr Orion Crook as planned. Discuss ovarian function and pelvic pain at time of return appointment  May consider radiofrequency rhizolysis and other procedures pending follow-up evaluations  Patient is to call pain management  prior to scheduled return appointment for any  concernsS

## 2016-04-09 ENCOUNTER — Encounter: Payer: Self-pay | Admitting: Pain Medicine

## 2016-04-09 ENCOUNTER — Ambulatory Visit: Payer: Medicare Other | Attending: Pain Medicine | Admitting: Pain Medicine

## 2016-04-09 VITALS — BP 102/51 | HR 63 | Temp 97.4°F | Resp 15 | Ht 64.0 in | Wt 270.0 lb

## 2016-04-09 DIAGNOSIS — N803 Endometriosis of pelvic peritoneum, unspecified: Secondary | ICD-10-CM

## 2016-04-09 DIAGNOSIS — M5136 Other intervertebral disc degeneration, lumbar region: Secondary | ICD-10-CM | POA: Insufficient documentation

## 2016-04-09 DIAGNOSIS — M461 Sacroiliitis, not elsewhere classified: Secondary | ICD-10-CM

## 2016-04-09 DIAGNOSIS — M47818 Spondylosis without myelopathy or radiculopathy, sacral and sacrococcygeal region: Secondary | ICD-10-CM

## 2016-04-09 DIAGNOSIS — M47816 Spondylosis without myelopathy or radiculopathy, lumbar region: Secondary | ICD-10-CM

## 2016-04-09 DIAGNOSIS — M545 Low back pain: Secondary | ICD-10-CM | POA: Diagnosis present

## 2016-04-09 DIAGNOSIS — M79606 Pain in leg, unspecified: Secondary | ICD-10-CM | POA: Diagnosis not present

## 2016-04-09 DIAGNOSIS — M25559 Pain in unspecified hip: Secondary | ICD-10-CM

## 2016-04-09 DIAGNOSIS — M533 Sacrococcygeal disorders, not elsewhere classified: Secondary | ICD-10-CM

## 2016-04-09 MED ORDER — BUPIVACAINE HCL (PF) 0.25 % IJ SOLN
INTRAMUSCULAR | Status: AC
  Administered 2016-04-09: 08:00:00
  Filled 2016-04-09: qty 30

## 2016-04-09 MED ORDER — MIDAZOLAM HCL 5 MG/5ML IJ SOLN
INTRAMUSCULAR | Status: AC
  Administered 2016-04-09: 5 mg via INTRAVENOUS
  Filled 2016-04-09: qty 5

## 2016-04-09 MED ORDER — ORPHENADRINE CITRATE 30 MG/ML IJ SOLN
INTRAMUSCULAR | Status: AC
  Administered 2016-04-09: 08:00:00
  Filled 2016-04-09: qty 2

## 2016-04-09 MED ORDER — LIDOCAINE HCL (PF) 1 % IJ SOLN
INTRAMUSCULAR | Status: AC
  Filled 2016-04-09: qty 5

## 2016-04-09 MED ORDER — TRIAMCINOLONE ACETONIDE 40 MG/ML IJ SUSP
INTRAMUSCULAR | Status: AC
  Administered 2016-04-09: 08:00:00
  Filled 2016-04-09: qty 1

## 2016-04-09 MED ORDER — FENTANYL CITRATE (PF) 100 MCG/2ML IJ SOLN
INTRAMUSCULAR | Status: AC
  Administered 2016-04-09: 100 ug
  Filled 2016-04-09: qty 2

## 2016-04-09 NOTE — Patient Instructions (Signed)
PLAN   Continue present medication Neurontin Flexeril and hydrocodone acetaminoph  F/U PCP Dr. Gavin PottersGrandis for evaliation of  BP and general medical  condition. Follow-up with Dr. Gavin PottersGrandis as discussed regarding blood pressure and general medical condition Patient denies orthostatic changes or other symptoms  F/U surgical evaluation. May consider pending follow-up evaluations  F/U neurological evaluation. May consider pending follow-up evaluations  Ask receptionist if you are already approved for radiofrequency of the lumbar facets  F/U Dr Orion CrookZolnoun as planned. Discuss ovarian function and pelvic pain at time of return appointment  The patient is to call pain management prior to scheduled return appointment for any concerns or guarding condition

## 2016-04-09 NOTE — Progress Notes (Signed)
Safety precautions to be maintained throughout the outpatient stay will include: orient to surroundings, keep bed in low position, maintain call bell within reach at all times, provide assistance with transfer out of bed and ambulation.  

## 2016-04-09 NOTE — Progress Notes (Signed)
Subjective:    Patient ID: [name] DakinsShannon D [name], female    DOB: 05/22/1976, 40 y.o.   MRN: [id number]  HPI  PROCEDURE:  Block of nerves to the sacroiliac joint.   NOTE:  The patient is a 40 y.o. female who returns to the [facility] for further evaluation and treatment of pain involving the lower back and lower extremity region with pain in the region of the buttocks as well. Prior MRI studies reveal degenerative disc disease of the lumbar spine. The patient is with positive Patrick's maneuver and with increased pain of severe degree. Palpation of the PSIS and PII S regions .   There is concern regarding a significant component of the patient's pain being due to sacroiliac joint dysfunction The risks, benefits, expectations of the procedure have been discussed and explained to the patient who is understanding and willing to proceed with interventional treatment in attempt to decrease severity of patient's symptoms, minimize the risk of medication escalation and  hopefully retard the progression of the patient's symptoms. We will proceed with what is felt to be a medically necessary procedure, block of nerves to the sacroiliac joint.   DESCRIPTION OF PROCEDURE:  Block of nerves to the sacroiliac joint.   The patient was taken to the fluoroscopy suite. With the patient in the prone position with EKG, blood pressure, pulse, capnography, and pulse oximetry monitoring, IV Versed, IV fentanyl conscious sedation, Betadine prep of proposed entry site was performed.   Block of nerves at the L5 vertebral body level.   With the patient in prone position, under fluoroscopic guidance, a 22 -gauge needle was inserted at the L5 vertebral body level on the left left side. With 15 degrees oblique orientation a 22 -gauge needle was inserted in the region known as Burton's eye or eye of the Scotty dog. Following documentation of needle placement in the area of Burton's eye or eye of the Scotty dog under  fluoroscopic guidance, needle placement was then accomplished at the sacral ala level on the left side.   Needle placement at the sacral ala.   With the patient in prone position under fluoroscopic guidance with AP view of the lumbosacral spine, a 22 -gauge needle was inserted in the region known as the sacral ala on the left side. Following documentation of needle placement on the left side under fluoroscopic guidance needle placement was then accomplished at the S1 foramen level.   Needle placement at the S1 foramen level.   With the patient in prone position under fluoroscopic guidance with AP view of the lumbosacral spine and cephalad orientation, a 22 -gauge needle was inserted at the superior and lateral border of the S1 foramen on the left side. Following documentation of needle placement at the S1 foramen level on the left side, needle placement was then accomplished at the S2 foramen level on the left side.   Needle placement at the S2 foramen level.   With the patient in prone position with AP view of the lumbosacral spine with cephalad orientation, a 22 - gauge needle was inserted at the superior and lateral border of the S2 foramen under fluoroscopic guidance on the left side. Following needle placement at the L5 vertebral body level, sacral ala, S1 foramen and S2 foramen on the left side, needle placement was verified on lateral view under fluoroscopic guidance.  Following needle placement documentation on lateral view, each needle was injected with 1 mL of 0.25% bupivacaine and Kenalog.   BLOCK OF  THE NERVES TO SACROILIAC JOINT ON THE RIGHT SIDE The procedure was performed on the right side at the same levels as was performed on the left side and utilizing the same technique as on the left side and was performed under fluoroscopic guidance as on the left side   A total of  of Kenalog was utilized for the procedure.   PLAN:  1. Medications: The patient will continue presently  prescribed medications Neurontin and hydrocodone acetaminophen  2. The patient will be considered for modification of treatment regimen pending response to the procedure performed on today's visit.  3. The patient is to follow-up with primary care physician Dr. Gavin Potters for evaluation of blood pressure and general medical condition following the procedure performed on today's visit.  4. Surgical evaluation as discussed.  5. Neurological evaluation as discussed.  6. The patient may be a candidate for radiofrequency procedures, implantation devices and other treatment pending response to treatment performed on today's visit and follow-up evaluation.  7. The patient has been advised to adhere to proper body mechanics and to avoid activities which may exacerbate the patient's symptoms.   Return appointment to [facility] as scheduled.    Review of Systems     Objective:   Physical Exam        Assessment & Plan:

## 2016-04-10 ENCOUNTER — Telehealth: Payer: Self-pay | Admitting: *Deleted

## 2016-04-10 NOTE — Telephone Encounter (Signed)
No problems post procedure. 

## 2016-05-03 ENCOUNTER — Ambulatory Visit: Payer: Medicare Other | Attending: Pain Medicine | Admitting: Pain Medicine

## 2016-05-03 ENCOUNTER — Encounter: Payer: Self-pay | Admitting: Pain Medicine

## 2016-05-03 ENCOUNTER — Telehealth: Payer: Self-pay | Admitting: *Deleted

## 2016-05-03 VITALS — BP 106/61 | HR 65 | Temp 98.0°F | Resp 16 | Ht 64.0 in | Wt 270.0 lb

## 2016-05-03 DIAGNOSIS — M25559 Pain in unspecified hip: Secondary | ICD-10-CM

## 2016-05-03 DIAGNOSIS — M545 Low back pain: Secondary | ICD-10-CM | POA: Diagnosis present

## 2016-05-03 DIAGNOSIS — N809 Endometriosis, unspecified: Secondary | ICD-10-CM | POA: Diagnosis not present

## 2016-05-03 DIAGNOSIS — G588 Other specified mononeuropathies: Secondary | ICD-10-CM | POA: Insufficient documentation

## 2016-05-03 DIAGNOSIS — M5136 Other intervertebral disc degeneration, lumbar region: Secondary | ICD-10-CM | POA: Diagnosis not present

## 2016-05-03 DIAGNOSIS — N803 Endometriosis of pelvic peritoneum, unspecified: Secondary | ICD-10-CM

## 2016-05-03 DIAGNOSIS — M47818 Spondylosis without myelopathy or radiculopathy, sacral and sacrococcygeal region: Secondary | ICD-10-CM

## 2016-05-03 DIAGNOSIS — M47816 Spondylosis without myelopathy or radiculopathy, lumbar region: Secondary | ICD-10-CM

## 2016-05-03 DIAGNOSIS — M533 Sacrococcygeal disorders, not elsewhere classified: Secondary | ICD-10-CM

## 2016-05-03 DIAGNOSIS — R109 Unspecified abdominal pain: Secondary | ICD-10-CM | POA: Diagnosis present

## 2016-05-03 DIAGNOSIS — M461 Sacroiliitis, not elsewhere classified: Secondary | ICD-10-CM

## 2016-05-03 DIAGNOSIS — M51369 Other intervertebral disc degeneration, lumbar region without mention of lumbar back pain or lower extremity pain: Secondary | ICD-10-CM

## 2016-05-03 MED ORDER — HYDROCODONE-ACETAMINOPHEN 7.5-325 MG/15ML PO SOLN: ORAL | Status: DC

## 2016-05-03 NOTE — Telephone Encounter (Signed)
Pt is aware that Berkley Harveyauth will be required for her radiofrequency lumbar facets. I place order in Angie [email or web address] [email or web address]

## 2016-05-03 NOTE — Progress Notes (Signed)
Safety precautions to be maintained throughout the outpatient stay will include: orient to surroundings, keep bed in low position, maintain call bell within reach at all times, provide assistance with transfer out of bed and ambulation.  

## 2016-05-03 NOTE — Progress Notes (Signed)
   Subjective:    Patient ID: [name] DakinsShannon D [name], female    DOB: 11/19/1976, 40 y.o.   MRN: [id number]  HPI  Patient is a [age] year old female who returns to pain management for further evaluation and treatment of pain involving the lower back and lower extremity region with pain of the abdominal and pelvic region as well. The patient is with known degenerative changes of the lumbar spine and has diagnosis of endometriosis. The patient has had improvement of her lower back lower extremity pain with interventional treatment in pain management. At the present time we awaiting insurance approval for radiofrequency rhizolysis lumbar facet, medial branch nerves on the left side. We will proceed with interventional treatment as discussed pending insurance approval. The patient will continue medications as prescribed at this time including Neurontin Flexeril and hydrocodone acetaminophen. Patient tolerating medications well without supple side effects area we will await insurance approval for radiofrequency rhizolysis lumbar facets on the left side and proceed with procedure pending approval by insurance. All understanding and agreement suggested treatment plan     Review of Systems     Objective:   Physical Exam  There was tenderness to palpation of the paraspinal muscular region cervical region cervical facet region a mild degree with mild tenderness of the splenius capitis and occipitalis musculature regions. Palpation of the thoracic facet thoracic paraspinal musculature region was with mild discomfort. There was no crepitus of the thoracic region noted. Palpation of the splenius capitis and occipitalis musculature region associated with mild discomfort. There appeared to be unremarkable Spurling's maneuver. There was mild tenderness of the acromioclavicular and glenohumeral joint regions. Tinel and Phalen's maneuver were without increase of pain of significant degree. Palpation over the lumbar paraspinal  musculature region lumbar facet region was associated with severe pain with lateral bending rotation extension and palpation of the lumbar facets reproduced in severely disc enabling pain left greater than right. There was tenderness over the PSIS and PII S region as well as a moderate degree. There was mild tenderness to moderate tenderness along the greater trochanteric region iliotibial band region. EHL strength appeared to be slightly decreased with no sensory deficit of dermatomal distribution detected. DTRs were difficult to elicit and appeared to be trace at the knees. There was no sensory deficit of dermatomal distribution detected. There was negative clonus negative Homans. Abdomen nontender with no costovertebral tenderness noted      Assessment & Plan:     Degenerative disc disease lumbar spine  Lumbar facet syndrome  Ilioinguinal iliohypogastric genitofemoral neuralgia   Sacroiliac joint dysfunction  Intercostal neuralgia  Endometriosis        PLAN   Continue present medication Neurontin Flexeril and hydrocodone acetaminophen  Radiofrequency lumbar facet, medial branch nerves to be performed at time of return appointment if we have insurance approval  F/U PCP Dr De BlanchH Grandis for evaliation of  BP and general medical  condition  F/U surgical evaluation. May consider pending follow-up evaluations  F/U neurological evaluation. May consider PNCV/EMG studies and other studies pending follow-up evaluations  Ask receptionist if you are already approved for radiofrequency of the lumbar facets  F/U Dr Orion CrookZolnoun as planned. Discuss ovarian function and pelvic pain at time of return appointment  May consider radiofrequency rhizolysis and other procedures pending follow-up evaluations  Patient is to call pain management prior to scheduled return appointment for any concerns

## 2016-05-03 NOTE — Patient Instructions (Addendum)
PLAN   Continue present medication Neurontin Flexeril and hydrocodone acetaminophen  Radiofrequency lumbar facet, medial branch nerves to be performed at time of return appointment if we have insurance approval  F/U PCP Dr De BlanchH Grandis for evaliation of  BP and general medical  condition  F/U surgical evaluation. May consider pending follow-up evaluations  F/U neurological evaluation. May consider PNCV/EMG studies and other studies pending follow-up evaluations  Ask receptionist if you are already approved for radiofrequency of the lumbar facets  F/U Dr Orion CrookZolnoun as planned. Discuss ovarian function and pelvic pain at time of return appointment  May consider radiofrequency rhizolysis and other procedures pending follow-up evaluations  Patient is to call pain management prior to scheduled return appointment for any concernsRadiofrequency Lesioning Radiofrequency lesioning is a procedure that is performed to relieve pain. The procedure is often used for back, neck, or arm pain. Radiofrequency lesioning involves the use of a machine that creates radio waves to make heat. During the procedure, the heat is applied to the nerve that carries the pain signal. The heat damages the nerve and interferes with the pain signal. Pain relief usually lasts for 6 months to 1 year. LET [facility] PROVID[facility] KNOW ABOUT:  Any allergies you have.  All medicines you are taking, including vitamins, herbs, eye drops, creams, and over-the-counter medicines.  Previous problems you or members of your family have had with the use of anesthetics.  Any blood disorders you have.  Previous surgeries you have had.  Any medical conditions you have.  Whether you are pregnant or may be pregnant. RISKS AND COMPLICATIONS Generally, this is a safe procedure. However, problems may occur, including:  Pain or soreness at the injection site.  Infection at the injection site.  Damage to nerves or blood vessels. BEFORE  THE PROCEDURE  Ask your health care provider about:  Changing or stopping your regular medicines. This is especially important if you are taking diabetes medicines or blood thinners.  Taking medicines such as aspirin and ibuprofen. These medicines can thin your blood. Do not take these medicines before your procedure if your health care provider instructs you not to.  Follow instructions from your health care provider about eating or drinking restrictions.  Plan to have someone take you home after the procedure.  If you go home right after the procedure, plan to have someone with you for 24 hours. PROCEDURE  You will be given one or more of the following:  A medicine to help you relax (sedative).  A medicine to numb the area (local anesthetic).  You will be awake during the procedure. You will need to be able to talk with the health care provider during the procedure.  With the help of a type of X-ray (fluoroscopy), the health care provider will insert a radiofrequency needle into the area to be treated.  Next, a wire that carries the radio waves (electrode) will be put through the radiofrequency needle. An electrical pulse will be sent through the electrode to verify the correct nerve. You will feel a tingling sensation, and you may have muscle twitching.  Then, the tissue that is around the needle tip will be heated by an electric current that is passed using the radiofrequency machine. This will numb the nerves.  A bandage (dressing) will be put on the insertion area after the procedure is done. The procedure may vary among health care providers and hospitals. AFTER THE PROCEDURE  Your blood pressure, heart rate, breathing rate, and blood oxygen  level will be monitored often until the medicines you were given have worn off.  Return to your normal activities as directed by your health care provider.   This information is not intended to replace advice given to you by your  health care provider. Make sure you discuss any questions you have with your health care provider.   Document Released: 08/01/2011 Document Revised: 08/24/2015 Document Reviewed: 01/10/2015 Elsevier Interactive Patient Education [employer]

## 2016-05-15 ENCOUNTER — Telehealth: Payer: Self-pay | Admitting: *Deleted

## 2016-05-15 ENCOUNTER — Telehealth: Payer: Self-pay | Admitting: Pain Medicine

## 2016-05-15 ENCOUNTER — Ambulatory Visit
Admission: RE | Admit: 2016-05-15 | Discharge: 2016-05-15 | Disposition: A | Discharge status: Home / Self Care | Payer: Medicare Other | Source: Ambulatory Visit | Attending: Pain Medicine | Admitting: Pain Medicine

## 2016-05-15 ENCOUNTER — Other Ambulatory Visit: Payer: Self-pay | Admitting: Pain Medicine

## 2016-05-15 DIAGNOSIS — M25559 Pain in unspecified hip: Secondary | ICD-10-CM

## 2016-05-15 DIAGNOSIS — N803 Endometriosis of pelvic peritoneum, unspecified: Secondary | ICD-10-CM

## 2016-05-15 DIAGNOSIS — M47816 Spondylosis without myelopathy or radiculopathy, lumbar region: Secondary | ICD-10-CM

## 2016-05-15 DIAGNOSIS — R0781 Pleurodynia: Secondary | ICD-10-CM | POA: Insufficient documentation

## 2016-05-15 DIAGNOSIS — M533 Sacrococcygeal disorders, not elsewhere classified: Secondary | ICD-10-CM

## 2016-05-15 DIAGNOSIS — M5136 Other intervertebral disc degeneration, lumbar region: Secondary | ICD-10-CM

## 2016-05-15 DIAGNOSIS — M47818 Spondylosis without myelopathy or radiculopathy, sacral and sacrococcygeal region: Secondary | ICD-10-CM

## 2016-05-15 DIAGNOSIS — M461 Sacroiliitis, not elsewhere classified: Secondary | ICD-10-CM

## 2016-05-15 NOTE — Telephone Encounter (Signed)
Called to patient to make her aware that if she has SOB or difficulting breathing she should go to the ED to be assessed. Patient verbalizes u/o information.

## 2016-05-15 NOTE — Telephone Encounter (Signed)
Can't move her back, from left side around to front, please call asap, wants to speak with nurse

## 2016-05-15 NOTE — Telephone Encounter (Signed)
Spoke with patient, and she states that she fell on Thursday and is now experiencing left back pain that goes around her side just under her breast and is having difficulty breathing.  Patient has been using ice and heat with relief.  Spoke with Dr Metta Clinesrisp and he is ordering an xray to r/o and fractures, patient aware and will f/up with Dr Gavin PottersGrandis her primary care physician.  Also patient states she is awaiting a PA for her next procedure.

## 2016-05-16 ENCOUNTER — Other Ambulatory Visit: Payer: Self-pay | Admitting: Pain Medicine

## 2016-05-16 ENCOUNTER — Ambulatory Visit: Payer: Medicare Other | Attending: Pain Medicine | Admitting: Pain Medicine

## 2016-05-16 ENCOUNTER — Encounter: Payer: Self-pay | Admitting: Pain Medicine

## 2016-05-16 ENCOUNTER — Telehealth: Payer: Self-pay | Admitting: Pain Medicine

## 2016-05-16 VITALS — BP 131/81 | HR 68 | Temp 98.0°F | Resp 16 | Ht 64.0 in | Wt 265.0 lb

## 2016-05-16 DIAGNOSIS — M47816 Spondylosis without myelopathy or radiculopathy, lumbar region: Secondary | ICD-10-CM

## 2016-05-16 DIAGNOSIS — N803 Endometriosis of pelvic peritoneum, unspecified: Secondary | ICD-10-CM

## 2016-05-16 DIAGNOSIS — M47818 Spondylosis without myelopathy or radiculopathy, sacral and sacrococcygeal region: Secondary | ICD-10-CM

## 2016-05-16 DIAGNOSIS — M25559 Pain in unspecified hip: Secondary | ICD-10-CM

## 2016-05-16 DIAGNOSIS — W19XXXA Unspecified fall, initial encounter: Secondary | ICD-10-CM | POA: Insufficient documentation

## 2016-05-16 DIAGNOSIS — M5136 Other intervertebral disc degeneration, lumbar region: Secondary | ICD-10-CM

## 2016-05-16 DIAGNOSIS — M533 Sacrococcygeal disorders, not elsewhere classified: Secondary | ICD-10-CM

## 2016-05-16 DIAGNOSIS — M546 Pain in thoracic spine: Secondary | ICD-10-CM | POA: Diagnosis not present

## 2016-05-16 DIAGNOSIS — G588 Other specified mononeuropathies: Secondary | ICD-10-CM | POA: Insufficient documentation

## 2016-05-16 DIAGNOSIS — M940 Chondrocostal junction syndrome [Tietze]: Secondary | ICD-10-CM

## 2016-05-16 DIAGNOSIS — M461 Sacroiliitis, not elsewhere classified: Secondary | ICD-10-CM

## 2016-05-16 DIAGNOSIS — S299XXA Unspecified injury of thorax, initial encounter: Secondary | ICD-10-CM | POA: Insufficient documentation

## 2016-05-16 MED ORDER — FENTANYL CITRATE (PF) 100 MCG/2ML IJ SOLN
100.0000 ug | Freq: Once | INTRAMUSCULAR | Status: AC
  Administered 2016-05-16: 100 ug via INTRAVENOUS
  Filled 2016-05-16: qty 2

## 2016-05-16 MED ORDER — MIDAZOLAM HCL 5 MG/5ML IJ SOLN
5.0000 mg | Freq: Once | INTRAMUSCULAR | Status: AC
  Administered 2016-05-16: 5 mg via INTRAVENOUS
  Filled 2016-05-16: qty 5

## 2016-05-16 MED ORDER — ORPHENADRINE CITRATE 30 MG/ML IJ SOLN
60.0000 mg | Freq: Once | INTRAMUSCULAR | Status: AC
  Administered 2016-05-16: 60 mg via INTRAMUSCULAR
  Filled 2016-05-16: qty 2

## 2016-05-16 MED ORDER — LACTATED RINGERS IV SOLN
1000.0000 mL | INTRAVENOUS | Status: DC
  Administered 2016-05-16: 1000 mL via INTRAVENOUS

## 2016-05-16 MED ORDER — BUPIVACAINE HCL (PF) 0.25 % IJ SOLN
30.0000 mL | Freq: Once | INTRAMUSCULAR | Status: AC
  Administered 2016-05-16: 30 mL
  Filled 2016-05-16: qty 30

## 2016-05-16 MED ORDER — TRIAMCINOLONE ACETONIDE 40 MG/ML IJ SUSP
40.0000 mg | Freq: Once | INTRAMUSCULAR | Status: AC
  Administered 2016-05-16: 40 mg
  Filled 2016-05-16: qty 1

## 2016-05-16 NOTE — Progress Notes (Signed)
Subjective:    Patient ID: [name] DakinsShannon D Hinz, female    DOB: 05/31/1976, 40 y.o.   MRN: [id number]  HPI  PROCEDURE PERFORMED:  Intercostal nerve block.  HISTORY OF PRESENT ILLNESS:  The patient is a 40 y.o. female who [facility] for further evaluation and treatment of pain involving the midportion of the back. . The patient recently fell sustaining injury to the thoracic region especially on the left. The patient has been unable to breathe deeply and has had severe disabling pain and has been splinting decision has been made to proceed with interventional treatment in attempt to decrease severity of patient's symptoms, minimize progression of patient's symptoms, improve patient ability to breathe deeply to avoid atelectasis and development of pneumonia or other sequela . 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 The risks, benefits, and expectations of the procedure have been discussed and explained to the patient who was understanding and in agreement with suggested treatment plan. We will proceed with interventional treatment as discussed.   DESCRIPTION OF PROCEDURE: Intercostal nerve block with IV Versed, IV fentanyl conscious sedation, EKG, blood pressure, pulse, capnography, and pulse oximetry monitoring. The procedure was performed with the patient in the prone position under fluoroscopic guidance.   Intercostal nerve block, left side: With the patient in the prone position, Betadine prep of proposed entry site was performed under fluoroscopic guidance with AP view of the thoracic spine. Under fluoroscopic guidance, a 22 -gauge needle was inserted to contact bone of the 11th rib on the left side after which the needle was repositioned at the inferior border of the 11th rib on the left side under fluoroscopic guidance. Following documentation of needle placement, at the inferior border of the 11th rib on the  left side and negative aspiration, a total of 3 mL of 0.25% bupivacaine with Kenalog was injected for left side for 11th rib intercostal nerve block.   INTERCOSTAL NERVE BLOCKS AT T10, T9, T8, T7, and T6 LEVELS: The procedure was performed at these levels as was performed at the previous level, T 11, utilizing the same technique and under fluoroscopic guidance  Myoneural block injection of the thoracic region Following Betadine prep of proposed entry site a 22-gauge needle was inserted into the thoracic musculature region and following negative aspiration 2 cc of 0.25% bupivacaine with Norflex was injected for myoneural block injection of the thoracic region times two.  The patient tolerated procedure well  A total of 10 mg Kenalog was utilized for the procedure.   PLAN:   1. Medications: We will continue presently prescribed medications. Neurontin Flexeril and hydrocodone acetaminophen 2. The patient is to follow up with primary care physician Dr. Rolm GalaHeidi Grandis for further evaluation of blood pressure and pulmonary condition and pain of the thoracic region following fall and for evaluation of general medical condition as discussed. 3. Surgical evaluation. May be considered pending further evaluation 4. Neurological evaluation. Has been addressed 5. May consider the patient for additional studies pending response to treatment and follow-up evaluation. 6. May consider radiofrequency procedures, implantation type procedures and other treatment pending response to treatment and follow-up evaluation. 7. The patient has been advised to adhere to proper body mechanics and to call the Pain Management Center prior to scheduled return appointment should there be significant change in condition or have other concerns regarding condition prior to scheduled return appointment.  The patient is understanding and in agreement with suggested treatment  plan.    Review of Systems     Objective:   Physical  Exam        Assessment & Plan:

## 2016-05-16 NOTE — Telephone Encounter (Signed)
Have we gotten results of x-rays? She is in excrutiating pain

## 2016-05-16 NOTE — Patient Instructions (Addendum)
PLAN   Continue present medication Neurontin Flexeril and hydrocodone acetaminoph  F/U PCP Dr. Gavin Potters for evaliation of  BP thoracic pain and evaluation of general medical condition status post fall and trauma to thoracic region. Follow-up with Dr. Gavin Potters as discussed regarding blood pressure and general medical condition this week please  F/U surgical evaluation. May consider pending follow-up evaluations  F/U neurological evaluation. May consider pending follow-up evaluations  Ask receptionist if you are already approved for radiofrequency of the lumbar facets  F/U Dr Orion Crook as planned. Discuss ovarian function and pelvic pain at time of return appointment  The patient is to call pain management prior to scheduled return appointment for any concerns reguarding condition  GENERAL RISKS AND COMPLICATIONS  What are the risk, side effects and possible complications? Generally speaking, most procedures are safe.  However, with any procedure there are risks, side effects, and the possibility of complications.  The risks and complications are dependent upon the sites that are lesioned, or the type of nerve block to be performed.  The closer the procedure is to the spine, the more serious the risks are.  Great care is taken when placing the radio frequency needles, block needles or lesioning probes, but sometimes complications can occur. 1. Infection: Any time there is an injection through the skin, there is a risk of infection.  This is why sterile conditions are used for these blocks.  There are four possible types of infection. 1. Localized skin infection. 2. Central Nervous System Infection-This can be in the form of Meningitis, which can be deadly. 3. Epidural Infections-This can be in the form of an epidural abscess, which can cause pressure inside of the spine, causing compression of the spinal cord with subsequent paralysis. This would require an emergency surgery to decompress, and  there are no guarantees that the patient would recover from the paralysis. 4. Discitis-This is an infection of the intervertebral discs.  It occurs in about 1% of discography procedures.  It is difficult to treat and it may lead to surgery.        2. Pain: the needles have to go through skin and soft tissues, will cause soreness.       3. Damage to internal structures:  The nerves to be lesioned may be near blood vessels or    other nerves which can be potentially damaged.       4. Bleeding: Bleeding is more common if the patient is taking blood thinners such as  aspirin, Coumadin, Ticiid, Plavix, etc., or if he/she have some genetic predisposition  such as hemophilia. Bleeding into the spinal canal can cause compression of the spinal  cord with subsequent paralysis.  This would require an emergency surgery to  decompress and there are no guarantees that the patient would recover from the  paralysis.       5. Pneumothorax:  Puncturing of a lung is a possibility, every time a needle is introduced in  the area of the chest or upper back.  Pneumothorax refers to free air around the  collapsed lung(s), inside of the thoracic cavity (chest cavity).  Another two possible  complications related to a similar event would include: Hemothorax and Chylothorax.   These are variations of the Pneumothorax, where instead of air around the collapsed  lung(s), you may have blood or chyle, respectively.       6. Spinal headaches: They may occur with any procedures in the area of the spine.  7. Persistent CSF (Cerebro-Spinal Fluid) leakage: This is a rare problem, but may occur  with prolonged intrathecal or epidural catheters either due to the formation of a fistulous  track or a dural tear.       8. Nerve damage: By working so close to the spinal cord, there is always a possibility of  nerve damage, which could be as serious as a permanent spinal cord injury with  paralysis.       9. Death:  Although rare, severe  deadly allergic reactions known as "Anaphylactic  reaction" can occur to any of the medications used.      10. Worsening of the symptoms:  We can always make thing worse.  What are the chances of something like this happening? Chances of any of this occuring are extremely low.  By statistics, you have more of a chance of getting killed in a motor vehicle accident: while driving to the hospital than any of the above occurring .  Nevertheless, you should be aware that they are possibilities.  In general, it is similar to taking a shower.  Everybody knows that you can slip, hit your head and get killed.  Does that mean that you should not shower again?  Nevertheless always keep in mind that statistics do not mean anything if you happen to be on the wrong side of them.  Even if a procedure has a 1 (one) in a 1,000,000 (million) chance of going wrong, it you happen to be that one..Also, keep in mind that by statistics, you have more of a chance of having something go wrong when taking medications.  Who should not have this procedure? If you are on a blood thinning medication (e.g. Coumadin, Plavix, see list of "Blood Thinners"), or if you have an active infection going on, you should not have the procedure.  If you are taking any blood thinners, please inform your physician.  How should I prepare for this procedure?  Do not eat or drink anything at least six hours prior to the procedure.  Bring a driver with you .  It cannot be a taxi.  Come accompanied by an adult that can drive you back, and that is strong enough to help you if your legs get weak or numb from the local anesthetic.  Take all of your medicines the morning of the procedure with just enough water to swallow them.  If you have diabetes, make sure that you are scheduled to have your procedure done first thing in the morning, whenever possible.  If you have diabetes, take only half of your insulin dose and notify our nurse that you have  done so as soon as you arrive at the clinic.  If you are diabetic, but only take blood sugar pills (oral hypoglycemic), then do not take them on the morning of your procedure.  You may take them after you have had the procedure.  Do not take aspirin or any aspirin-containing medications, at least eleven (11) days prior to the procedure.  They may prolong bleeding.  Wear loose fitting clothing that may be easy to take off and that you would not mind if it got stained with Betadine or blood.  Do not wear any jewelry or perfume  Remove any nail coloring.  It will interfere with some of our monitoring equipment.  NOTE: Remember that this is not meant to be interpreted as a complete list of all possible complications.  Unforeseen problems may occur.  BLOOD THINNERS  The following drugs contain aspirin or other products, which can cause increased bleeding during surgery and should not be taken for 2 weeks prior to and 1 week after surgery.  If you should need take something for relief of minor pain, you may take acetaminophen which is found in Tylenol,m Datril, Anacin-3 and Panadol. It is not blood thinner. The products listed below are.  Do not take any of the products listed below in addition to any listed on your instruction sheet.  A.P.C or A.P.C with Codeine Codeine Phosphate Capsules #3 Ibuprofen Ridaura  ABC compound Congesprin Imuran rimadil  Advil Cope Indocin Robaxisal  Alka-Seltzer Effervescent Pain Reliever and Antacid Coricidin or Coricidin-D  Indomethacin Rufen  Alka-Seltzer plus Cold Medicine Cosprin Ketoprofen S-A-C Tablets  Anacin Analgesic Tablets or Capsules Coumadin Korlgesic Salflex  Anacin Extra Strength Analgesic tablets or capsules CP-2 Tablets Lanoril Salicylate  Anaprox Cuprimine Capsules Levenox Salocol  Anexsia-D Dalteparin Magan Salsalate  Anodynos Darvon compound Magnesium Salicylate Sine-off  Ansaid Dasin Capsules Magsal Sodium Salicylate  Anturane Depen Capsules  Marnal Soma  APF Arthritis pain formula Dewitt's Pills Measurin Stanback  Argesic Dia-Gesic Meclofenamic Sulfinpyrazone  Arthritis Bayer Timed Release Aspirin Diclofenac Meclomen Sulindac  Arthritis pain formula Anacin Dicumarol Medipren Supac  Analgesic (Safety coated) Arthralgen Diffunasal Mefanamic Suprofen  Arthritis Strength Bufferin Dihydrocodeine Mepro Compound Suprol  Arthropan liquid Dopirydamole Methcarbomol with Aspirin Synalgos  ASA tablets/Enseals Disalcid Micrainin Tagament  Ascriptin Doan's Midol Talwin  Ascriptin A/D Dolene Mobidin Tanderil  Ascriptin Extra Strength Dolobid Moblgesic Ticlid  Ascriptin with Codeine Doloprin or Doloprin with Codeine Momentum Tolectin  Asperbuf Duoprin Mono-gesic Trendar  Aspergum Duradyne Motrin or Motrin IB Triminicin  Aspirin plain, buffered or enteric coated Durasal Myochrisine Trigesic  Aspirin Suppositories Easprin Nalfon Trillsate  Aspirin with Codeine Ecotrin Regular or Extra Strength Naprosyn Uracel  Atromid-S Efficin Naproxen Ursinus  Auranofin Capsules Elmiron Neocylate Vanquish  Axotal Emagrin Norgesic Verin  Azathioprine Empirin or Empirin with Codeine Normiflo Vitamin E  Azolid Emprazil Nuprin Voltaren  Bayer Aspirin plain, buffered or children's or timed BC Tablets or powders Encaprin Orgaran Warfarin Sodium  Buff-a-Comp Enoxaparin Orudis Zorpin  Buff-a-Comp with Codeine Equegesic Os-Cal-Gesic   Buffaprin Excedrin plain, buffered or Extra Strength Oxalid   Bufferin Arthritis Strength Feldene Oxphenbutazone   Bufferin plain or Extra Strength Feldene Capsules Oxycodone with Aspirin   Bufferin with Codeine Fenoprofen Fenoprofen Pabalate or Pabalate-SF   Buffets II Flogesic Panagesic   Buffinol plain or Extra Strength Florinal or Florinal with Codeine Panwarfarin   Buf-Tabs Flurbiprofen Penicillamine   Butalbital Compound Four-way cold tablets Penicillin   Butazolidin Fragmin Pepto-Bismol   Carbenicillin Geminisyn Percodan    Carna Arthritis Reliever Geopen Persantine   Carprofen Gold's salt Persistin   Chloramphenicol Goody's Phenylbutazone   Chloromycetin Haltrain Piroxlcam   Clmetidine heparin Plaquenil   Cllnoril Hyco-pap Ponstel   Clofibrate Hydroxy chloroquine Propoxyphen         Before stopping any of these medications, be sure to consult the physician who ordered them.  Some, such as Coumadin (Warfarin) are ordered to prevent or treat serious conditions such as "deep thrombosis", "pumonary embolisms", and other heart problems.  The amount of time that you may need off of the medication may also vary with the medication and the reason for which you were taking it.  If you are taking any of these medications, please make sure you notify your pain physician before you undergo any procedures.  Pain Management Discharge Instructions  General Discharge Instructions :  If you need to reach your doctor call: Monday-Friday 8:00 am - 4:00 pm at [phone] or toll free [phone].  After clinic hours [phone] to have operator reach doctor.  Bring all of your medication bottles to all your appointments in the pain clinic.  To cancel or reschedule your appointment with Pain Management please remember to call 24 hours in advance to avoid a fee.  Refer to the educational materials which you have been given on: General Risks, I had my Procedure. Discharge Instructions, Post Sedation.  Post Procedure Instructions:  The drugs you were given will stay in your system until tomorrow, so for the next 24 hours you should not drive, make any legal decisions or drink any alcoholic beverages.  You may eat anything you prefer, but it is better to start with liquids then soups and crackers, and gradually work up to solid foods.  Please notify your doctor immediately if you have any unusual bleeding, trouble breathing or pain that is not related to your normal pain.  Depending on the type of procedure that was done,  some parts of your body may feel week and/or numb.  This usually clears up by tonight or the next day.  Walk with the use of an assistive device or accompanied by an adult for the 24 hours.  You may use ice on the affected area for the first 24 hours.  Put ice in a Ziploc bag and cover with a towel and place against area 15 minutes on 15 minutes off.  You may switch to heat after 24 hours.

## 2016-05-16 NOTE — Telephone Encounter (Signed)
Patient advised that chest x-ray is normal. States Dr. Metta Clinesrisp had attempted to call her and left a message. Nurse spoke with Dr. Metta Clinesrisp, patient may come in today for intercostal nerve block. Patient agrees to come in.

## 2016-05-16 NOTE — Progress Notes (Signed)
Safety precautions to be maintained throughout the outpatient stay will include: orient to surroundings, keep bed in low position, maintain call bell within reach at all times, provide assistance with transfer out of bed and ambulation.  

## 2016-05-17 ENCOUNTER — Telehealth: Payer: Self-pay | Admitting: *Deleted

## 2016-05-17 ENCOUNTER — Telehealth: Payer: Self-pay

## 2016-05-17 NOTE — Telephone Encounter (Signed)
Pt says she wants someone to call her. She said someone called her earlier. Would not say what this was concerning.

## 2016-05-17 NOTE — Telephone Encounter (Signed)
Spoke with patient, she states that she did get relief from procedure, approximately 80%.  Verbalize no questions or concerns.

## 2016-05-17 NOTE — Telephone Encounter (Signed)
Left voicemail with patient to call our office if there are questions or concerns re; procedure on yesterday. 

## 2016-05-21 ENCOUNTER — Other Ambulatory Visit: Payer: Self-pay | Admitting: Pain Medicine

## 2016-05-21 DIAGNOSIS — M461 Sacroiliitis, not elsewhere classified: Secondary | ICD-10-CM

## 2016-05-21 DIAGNOSIS — M940 Chondrocostal junction syndrome [Tietze]: Secondary | ICD-10-CM

## 2016-05-21 DIAGNOSIS — G588 Other specified mononeuropathies: Secondary | ICD-10-CM

## 2016-05-21 DIAGNOSIS — N803 Endometriosis of pelvic peritoneum, unspecified: Secondary | ICD-10-CM

## 2016-05-21 DIAGNOSIS — M533 Sacrococcygeal disorders, not elsewhere classified: Secondary | ICD-10-CM

## 2016-05-21 DIAGNOSIS — M47818 Spondylosis without myelopathy or radiculopathy, sacral and sacrococcygeal region: Secondary | ICD-10-CM

## 2016-05-21 DIAGNOSIS — M47816 Spondylosis without myelopathy or radiculopathy, lumbar region: Secondary | ICD-10-CM

## 2016-05-21 DIAGNOSIS — M5136 Other intervertebral disc degeneration, lumbar region: Secondary | ICD-10-CM

## 2016-05-21 DIAGNOSIS — M25559 Pain in unspecified hip: Secondary | ICD-10-CM

## 2016-05-29 ENCOUNTER — Encounter: Payer: Self-pay | Admitting: Pain Medicine

## 2016-05-29 ENCOUNTER — Ambulatory Visit: Payer: Medicare Other | Attending: Pain Medicine | Admitting: Pain Medicine

## 2016-05-29 ENCOUNTER — Telehealth: Payer: Self-pay

## 2016-05-29 VITALS — BP 94/55 | HR 79 | Temp 98.6°F | Resp 16 | Ht 64.0 in | Wt 270.0 lb

## 2016-05-29 DIAGNOSIS — N803 Endometriosis of pelvic peritoneum, unspecified: Secondary | ICD-10-CM

## 2016-05-29 DIAGNOSIS — M5136 Other intervertebral disc degeneration, lumbar region: Secondary | ICD-10-CM | POA: Diagnosis not present

## 2016-05-29 DIAGNOSIS — M461 Sacroiliitis, not elsewhere classified: Secondary | ICD-10-CM

## 2016-05-29 DIAGNOSIS — N809 Endometriosis, unspecified: Secondary | ICD-10-CM | POA: Diagnosis not present

## 2016-05-29 DIAGNOSIS — M533 Sacrococcygeal disorders, not elsewhere classified: Secondary | ICD-10-CM | POA: Insufficient documentation

## 2016-05-29 DIAGNOSIS — M47816 Spondylosis without myelopathy or radiculopathy, lumbar region: Secondary | ICD-10-CM

## 2016-05-29 DIAGNOSIS — M940 Chondrocostal junction syndrome [Tietze]: Secondary | ICD-10-CM

## 2016-05-29 DIAGNOSIS — G588 Other specified mononeuropathies: Secondary | ICD-10-CM | POA: Diagnosis not present

## 2016-05-29 DIAGNOSIS — M546 Pain in thoracic spine: Secondary | ICD-10-CM | POA: Diagnosis present

## 2016-05-29 DIAGNOSIS — M47818 Spondylosis without myelopathy or radiculopathy, sacral and sacrococcygeal region: Secondary | ICD-10-CM

## 2016-05-29 DIAGNOSIS — M25559 Pain in unspecified hip: Secondary | ICD-10-CM

## 2016-05-29 DIAGNOSIS — M545 Low back pain: Secondary | ICD-10-CM | POA: Diagnosis present

## 2016-05-29 MED ORDER — CYCLOBENZAPRINE HCL 10 MG PO TABS: ORAL_TABLET | ORAL | Status: DC

## 2016-05-29 MED ORDER — HYDROCODONE-ACETAMINOPHEN 7.5-325 MG/15ML PO SOLN: ORAL | Status: DC

## 2016-05-29 MED ORDER — GABAPENTIN 100 MG PO CAPS: 100.0000 mg | ORAL_CAPSULE | Freq: Three times a day (TID) | ORAL | Status: DC

## 2016-05-29 NOTE — Patient Instructions (Addendum)
PLAN   Continue present medication Neurontin Flexeril and hydrocodone acetaminophen  Radiofrequency rhizolysis lumbar facet, medial branch nerves to be performed at time return appointment  F/U PCP Dr De Blanch for evaliation of  BP and general medical  condition  F/U surgical evaluation. May consider pending follow-up evaluations  F/U neurological evaluation. May consider PNCV/EMG studies and other studies pending follow-up evaluations  F/U Dr Orion Crook as planned. Discuss ovarian function and pelvic pain at time of return appointment  May consider radiofrequency rhizolysis pending follow-up evaluations Awaiting insurance approval  Patient is to call pain management prior to scheduled return appointment for any concernsRadiofrequency Lesioning Radiofrequency lesioning is a procedure that is performed to relieve pain. The procedure is often used for back, neck, or arm pain. Radiofrequency lesioning involves the use of a machine that creates radio waves to make heat. During the procedure, the heat is applied to the nerve that carries the pain signal. The heat damages the nerve and interferes with the pain signal. Pain relief usually lasts for 6 months to 1 year. LET [facility] CARE PROVIDER KNOW ABOUT:  Any allergies you have.  All medicines you are taking, including vitamins, herbs, eye drops, creams, and over-the-counter medicines.  Previous problems you or members of your family have had with the use of anesthetics.  Any blood disorders you have.  Previous surgeries you have had.  Any medical conditions you have.  Whether you are pregnant or may be pregnant. RISKS AND COMPLICATIONS Generally, this is a safe procedure. However, problems may occur, including:  Pain or soreness at the injection site.  Infection at the injection site.  Damage to nerves or blood vessels. BEFORE THE PROCEDURE  Ask your health care provider about:  Changing or stopping your regular medicines.  This is especially important if you are taking diabetes medicines or blood thinners.  Taking medicines such as aspirin and ibuprofen. These medicines can thin your blood. Do not take these medicines before your procedure if your health care provider instructs you not to.  Follow instructions from your health care provider about eating or drinking restrictions.  Plan to have someone take you home after the procedure.  If you go home right after the procedure, plan to have someone with you for 24 hours. PROCEDURE  You will be given one or more of the following:  A medicine to help you relax (sedative).  A medicine to numb the area (local anesthetic).  You will be awake during the procedure. You will need to be able to talk with the health care provider during the procedure.  With the help of a type of X-ray (fluoroscopy), the health care provider will insert a radiofrequency needle into the area to be treated.  Next, a wire that carries the radio waves (electrode) will be put through the radiofrequency needle. An electrical pulse will be sent through the electrode to verify the correct nerve. You will feel a tingling sensation, and you may have muscle twitching.  Then, the tissue that is around the needle tip will be heated by an electric current that is passed using the radiofrequency machine. This will numb the nerves.  A bandage (dressing) will be put on the insertion area after the procedure is done. The procedure may vary among health care providers and hospitals. AFTER THE PROCEDURE  Your blood pressure, heart rate, breathing rate, and blood oxygen level will be monitored often until the medicines you were given have worn off.  Return to your normal  activities as directed by your health care provider.   This information is not intended to replace advice given to you by your health care provider. Make sure you discuss any questions you have with your health care provider.    Document Released: 08/01/2011 Document Revised: 08/24/2015 Document Reviewed: 01/10/2015 Elsevier Interactive Patient Education 2016 Elsevier Inc[employer] Facet Joint Block The facet joints connect the bones of the spine (vertebrae). They make it possible for you to bend, twist, and make other movements with your spine. They also prevent you from overbending, overtwisting, and making other excessive movements.  A facet joint block is a procedure where a numbing medicine (anesthetic) is injected into a facet joint. Often, a type of anti-inflammatory medicine called a steroid is also injected. A facet joint block may be done for two reasons:   Diagnosis. A facet joint block may be done as a test to see whether neck or back pain is caused by a worn-down or infected facet joint. If the pain gets better after a facet joint block, it means the pain is probably coming from the facet joint. If the pain does not get better, it means the pain is probably not coming from the facet joint.   Therapy. A facet joint block may be done to relieve neck or back pain caused by a facet joint. A facet joint block is only done as a therapy if the pain does not improve with medicine, exercise programs, physical therapy, and other forms of pain management. LET [facility] CARE PROVIDER KNOW ABOUT:   Any allergies you have.   All medicines you are taking, including vitamins, herbs, eyedrops, and over-the-counter medicines and creams.   Previous problems you or members of your family have had with the use of anesthetics.   Any blood disorders you have had.   Other health problems you have. RISKS AND COMPLICATIONS Generally, having a facet joint block is safe. However, as with any procedure, complications can occur. Possible complications associated with having a facet joint block include:   Bleeding.   Injury to a nerve near the injection site.   Pain at the injection site.   Weakness or numbness in areas  controlled by nerves near the injection site.   Infection.   Temporary fluid retention.   Allergic reaction to anesthetics or medicines used during the procedure. BEFORE THE PROCEDURE   Follow your health care provider's instructions if you are taking dietary supplements or medicines. You may need to stop taking them or reduce your dosage.   Do not take any new dietary supplements or medicines without asking your health care provider first.   Follow your health care provider's instructions about eating and drinking before the procedure. You may need to stop eating and drinking several hours before the procedure.   Arrange to have an adult drive you home after the procedure. PROCEDURE  You may need to remove your clothing and dress in an open-back gown so that your health care provider can access your spine.   The procedure will be done while you are lying on an X-ray table. Most of the time you will be asked to lie on your stomach, but you may be asked to lie in a different position if an injection will be made in your neck.   Special machines will be used to monitor your oxygen levels, heart rate, and blood pressure.   If an injection will be made in your neck, an intravenous (IV) tube will be inserted  into one of your veins. Fluids and medicine will flow directly into your body through the IV tube.   The area over the facet joint where the injection will be made will be cleaned with an antiseptic soap. The surrounding skin will be covered with sterile drapes.   An anesthetic will be applied to your skin to make the injection area numb. You may feel a temporary stinging or burning sensation.   A video X-ray machine will be used to locate the joint. A contrast dye may be injected into the facet joint area to help with locating the joint.   When the joint is located, an anesthetic medicine will be injected into the joint through the needle.   Your health care provider  will ask you whether you feel pain relief. If you do feel relief, a steroid may be injected to provide pain relief for a longer period of time. If you do not feel relief or feel only partial relief, additional injections of an anesthetic may be made in other facet joints.   The needle will be removed, the skin will be cleansed, and bandages will be applied.  AFTER THE PROCEDURE   You will be observed for 15-30 minutes before being allowed to go home. Do not drive. Have an adult drive you or take a taxi or public transportation instead.   If you feel pain relief, the pain will return in several hours or days when the anesthetic wears off.   You may feel pain relief 2-14 days after the procedure. The amount of time this relief lasts varies from person to person.   It is normal to feel some tenderness over the injected area(s) for 2 days following the procedure.   If you have diabetes, you may have a temporary increase in blood sugar.   This information is not intended to replace advice given to you by your health care provider. Make sure you discuss any questions you have with your health care provider.   Document Released: 04/24/2007 Document Revised: 12/24/2014 Document Reviewed: 09/22/2012 Elsevier Interactive Patient Education [employer]2016 Elsevier Inc.

## 2016-05-29 NOTE — Progress Notes (Signed)
   Subjective:    Patient ID: [name], female    DOB: 02/10/1976, 40 y.o.   MRN: [id number]  HPI  The patient is a 40 year old female who returns to pain management for further evaluation and treatment of pain involving the mid back region lower back and lower extremity region. The patient recently fell and had severe pain of the thoracic region with there being concern regarding fracture of ribs. The patient underwent intercostal nerve blocks with significant improvement of her pain. The patient continues to be with significant pain of the lumbar lower extremity region and we will proceed with radiofrequency rhizolysis lumbar facet, medial branch nerves at time of return appointment pending response to treatment and follow-up evaluation. The patient denies any other trauma change in events of daily living the call significant change in symptomatology. The patient continues Neurontin Flexeril and hydrocodone acetaminophen without undesirable side effects. The patient does admit to significant muscle spasms involving the thoracic and lumbar region. We will continue Flexeril at this time as well as consider additional modifications of treatment regimen and may replace Flexeril with another medication pending response to treatment and follow-up evaluation. All agreed to suggested treatment plan  Review of Systems     Objective:   Physical Exam  There was tenderness to palpation of the splenius capitis and occipitalis region a mild degree with mild tenderness over the region of the cervical facet cervical paraspinal musculature region. There was moderate tenderness to palpation and muscle spasms of the mid and lower thoracic paraspinal musculature region left greater than the right. There was tends to palpation over the lumbar paraspinal musculatures and lumbar facet region a moderate degree to moderately severe degree with lateral bending rotation extension and palpation over the lumbar facets  reproducing moderately severe discomfort. With of the thoracic region noted. The patient appeared to be with bilaterally equal grip strength and Tinel and Phalen's maneuver were without increased pain of significant degree. Straight leg raising was tolerates approximately 30 without an increase of pain with dorsiflexion noted. DTRs were difficult to this area no sensory deficit or dermatomal distribution detected. There was tenderness of the PSIS and PII S region a moderate degree with mild tenderness of the greater trochanteric region iliotibial band region. There was negative clonus negative Homans. Abdomen nontender with no costovertebral tenderness noted      Assessment & Plan:     Degenerative disc disease lumbar spine  Lumbar facet syndrome  Ilioinguinal iliohypogastric genitofemoral neuralgia   Sacroiliac joint dysfunction  Intercostal neuralgia  Endometriosis  Intercostal neuralgia with thoracic muscle spasm      PLAN   Continue present medication Neurontin Flexeril and hydrocodone acetaminophen  Radiofrequency rhizolysis lumbar facet, medial branch nerves to be performed at time return appointment  F/U PCP Dr De BlanchH Grandis for evaliation of  BP and general medical  condition  F/U surgical evaluation. May consider pending follow-up evaluations  F/U neurological evaluation. May consider PNCV/EMG studies and other studies pending follow-up evaluations  F/U Dr Orion CrookZolnoun as planned. Discuss ovarian function and pelvic pain at time of return appointment  May consider radiofrequency rhizolysis pending follow-up evaluations Awaiting insurance approval  Patient is to call pain management prior to scheduled return appointment for any concerns

## 2016-05-29 NOTE — Telephone Encounter (Signed)
Left voice mail

## 2016-05-29 NOTE — Progress Notes (Signed)
Safety precautions to be maintained throughout the outpatient stay will include: orient to surroundings, keep bed in low position, maintain call bell within reach at all times, provide assistance with transfer out of bed and ambulation.  

## 2016-05-29 NOTE — Telephone Encounter (Signed)
Dr. Metta Clinesrisp              Please advise on what to tell patient to do re: dosing of Flexeril. Thank you- Victorino DikeJennifer

## 2016-05-29 NOTE — Telephone Encounter (Signed)
Patient states that she talked with you this morning about changing the medications instead of adding a dose to the flexeril. Wants to know if you will change the muscle relaxer from flexeril to some other medication because the TID dosing doesn't seem to be working. Please advise.

## 2016-05-29 NOTE — Telephone Encounter (Signed)
[name] and Nurses Please call patient and inform patient that preferred to change medication at time of evaluation. Please let me know if this was satisfied patient Please let me know if any further discussion of patient's condition is necessary. Thank you

## 2016-05-29 NOTE — Telephone Encounter (Signed)
Pt says pharmacy told her it was not a good idea that Dr. Metta Clinesrisp wants her to take flexeril 4 times a day. Pt wants to know what can he do about this.  [phone]

## 2016-05-29 NOTE — Telephone Encounter (Signed)
Please inform patient to limit Flexeril to three pills per day to avoid drowsiness, excessive sedation, confusion and other side effects. We will send a prescription the same as previous prescription which was Flexeril 10 mg limit to 3 pills per day if tolerated. Please call patient and inform patient of this change

## 2016-06-02 ENCOUNTER — Ambulatory Visit
Admission: EM | Admit: 2016-06-02 | Discharge: 2016-06-02 | Disposition: A | Discharge status: Home / Self Care | Payer: Medicare Other | Attending: Family Medicine | Admitting: Family Medicine

## 2016-06-02 ENCOUNTER — Encounter: Payer: Self-pay | Admitting: Gynecology

## 2016-06-02 DIAGNOSIS — S40022A Contusion of left upper arm, initial encounter: Secondary | ICD-10-CM

## 2016-06-02 DIAGNOSIS — S80811A Abrasion, right lower leg, initial encounter: Secondary | ICD-10-CM | POA: Diagnosis not present

## 2016-06-02 MED ORDER — MUPIROCIN CALCIUM 2 % EX CREA: 1.0000 "application " | TOPICAL_CREAM | Freq: Two times a day (BID) | CUTANEOUS | Status: DC
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
## 2016-06-08 LAB — TOXASSURE SELECT 13 (MW), URINE: PDF: 0

## 2016-06-08 NOTE — Progress Notes (Signed)
Quick Note:  Reviewed. ______ 

## 2016-06-13 ENCOUNTER — Encounter: Payer: Self-pay | Admitting: Pain Medicine

## 2016-06-13 ENCOUNTER — Ambulatory Visit: Payer: Medicare Other | Attending: Pain Medicine | Admitting: Pain Medicine

## 2016-06-13 VITALS — BP 104/56 | HR 62 | Temp 97.3°F | Resp 18 | Ht 64.0 in | Wt 275.0 lb

## 2016-06-13 DIAGNOSIS — M545 Low back pain: Secondary | ICD-10-CM | POA: Diagnosis present

## 2016-06-13 DIAGNOSIS — M461 Sacroiliitis, not elsewhere classified: Secondary | ICD-10-CM

## 2016-06-13 DIAGNOSIS — M25559 Pain in unspecified hip: Secondary | ICD-10-CM

## 2016-06-13 DIAGNOSIS — G588 Other specified mononeuropathies: Secondary | ICD-10-CM

## 2016-06-13 DIAGNOSIS — M5136 Other intervertebral disc degeneration, lumbar region: Secondary | ICD-10-CM | POA: Insufficient documentation

## 2016-06-13 DIAGNOSIS — M47816 Spondylosis without myelopathy or radiculopathy, lumbar region: Secondary | ICD-10-CM

## 2016-06-13 DIAGNOSIS — N803 Endometriosis of pelvic peritoneum, unspecified: Secondary | ICD-10-CM

## 2016-06-13 DIAGNOSIS — M47818 Spondylosis without myelopathy or radiculopathy, sacral and sacrococcygeal region: Secondary | ICD-10-CM

## 2016-06-13 DIAGNOSIS — M51369 Other intervertebral disc degeneration, lumbar region without mention of lumbar back pain or lower extremity pain: Secondary | ICD-10-CM

## 2016-06-13 DIAGNOSIS — M533 Sacrococcygeal disorders, not elsewhere classified: Secondary | ICD-10-CM

## 2016-06-13 DIAGNOSIS — M940 Chondrocostal junction syndrome [Tietze]: Secondary | ICD-10-CM

## 2016-06-13 MED ORDER — LIDOCAINE HCL (PF) 1 % IJ SOLN
10.0000 mL | Freq: Once | INTRAMUSCULAR | Status: AC
  Administered 2016-06-13: 10 mL via SUBCUTANEOUS
  Filled 2016-06-13: qty 10

## 2016-06-13 MED ORDER — ORPHENADRINE CITRATE 30 MG/ML IJ SOLN
60.0000 mg | Freq: Once | INTRAMUSCULAR | Status: AC
  Administered 2016-06-13: 60 mg via INTRAMUSCULAR
  Filled 2016-06-13: qty 2

## 2016-06-13 MED ORDER — LIDOCAINE HCL (PF) 1 % IJ SOLN
INTRAMUSCULAR | Status: AC
  Administered 2016-06-13: 09:00:00
  Filled 2016-06-13: qty 5

## 2016-06-13 MED ORDER — LACTATED RINGERS IV SOLN
1000.0000 mL | INTRAVENOUS | Status: DC
  Administered 2016-06-13: 1000 mL via INTRAVENOUS

## 2016-06-13 MED ORDER — MIDAZOLAM HCL 5 MG/5ML IJ SOLN
5.0000 mg | Freq: Once | INTRAMUSCULAR | Status: AC
  Administered 2016-06-13: 4 mg via INTRAVENOUS
  Filled 2016-06-13: qty 5

## 2016-06-13 MED ORDER — BUPIVACAINE HCL (PF) 0.25 % IJ SOLN
30.0000 mL | Freq: Once | INTRAMUSCULAR | Status: AC
  Administered 2016-06-13: 30 mL
  Filled 2016-06-13: qty 30

## 2016-06-13 MED ORDER — FENTANYL CITRATE (PF) 100 MCG/2ML IJ SOLN
100.0000 ug | Freq: Once | INTRAMUSCULAR | Status: AC
  Administered 2016-06-13: 100 ug via INTRAVENOUS
  Filled 2016-06-13: qty 2

## 2016-06-13 NOTE — Progress Notes (Signed)
Safety precautions to be maintained throughout the outpatient stay will include: orient to surroundings, keep bed in low position, maintain call bell within reach at all times, provide assistance with transfer out of bed and ambulation.  

## 2016-06-13 NOTE — Progress Notes (Signed)
Subjective:    Patient ID: [name], female    DOB: [birth year], 40 y.o.   MRN: [id number]  HPI    COOLIEF RADIOFREQUENCY RHIZOLYSIS OF THE LUMBAR FACETS                             (MEDIAL BRANCH NERVES)   PROCEDURE PERFORMED: Radiofrequency rhizolysis (lunbar facet medial branch nerve radiofrequency rhizolysis).  The procedure was performed with the COOLIEF technique.  COOLIEF technique was performed using the Synergy cooled radiofrequency probe SIP-17-150-4, the synergy cooled radiofrequency introducer SII-17-150, and the synergy cooled sterile tube kit [id number].   HISTORY: The patient is a 40 y.o. female who returns to the [facility] for further evaluation of severely disabling pain involving the lumbar and lower extremity region.  MRI revealed multilevel degenerative disc disease of the lumbar spine   There is concern regarding significant component of patient's pain being due to facet arthropathy with facet syndrome. The patient has had significant relief of pain following previous lumbar facet, medial branch nerve, blocks. Radiofrequency rhizolysis of the lumbar facets, medial branch nerves, will be performed at this time in attempt to provide longer lasting relief of the patient's pain, minimize progression of the patient's symptoms, and avoid the need for more involved treatment. The risks, benefits and expectations of the procedure have been discussed and explained to the patient who was with understanding and wished to proceed with interventional treatment in an attempt to decrease severely  disabling pain of the lumbar and lower extremity region.  We will proceed with what is felt to be a medically necessary procedure, radiofrequency rhizolysis (lumbar facet medial branch nerve radiofrequency rhizolysis) using the COOLIEF technique.  DESCRIPTION OF PROCEDURE: COOLIEF Technique Radiofrequency Rhizolysis of Lumbar Facets (Medial Branch Nerves Radiofrequency Rhizolysis)  with IV Versed, IV Fentanyl conscious sedation, EKG, blood pressure, pulse, capnography, and pulse oximetry monitoring.  The procedure was performed with the patient in the prone position under fluoroscopic guidance.  Following Betadine prep of the proposed entry site, a 1.5% lidocaine plain skin wheal of the proposed needle entry site was  prepared with oblique orientation.   Left, L4-5 Radiofrequency Rhizolysis L5 Facet  (Medial Branch Nerve): Under fluoroscopic guidance, the radiofrequency needle was inserted at the L5 vertebral body level after a local anesthetic skin wheal of 1.5% lidocaine plain and Betadine prep of the proposed needle entry site was performed.  The needle was inserted under fluoroscopic guidance in the area known as Burton's eye or eye of the Scotty dog with needle placed at the superior and lateral border of the targeted area with oblique orientation utilizing the tunnel (gun  barrel approach) technique to the targeted area.  Left Radiofrequency Rhizolysis at L4 and L3  Lumbar Facets (Medial Branch Nerves) The procedure was performed at L4 and L3 exactly as was performed at the L5 and under fluoroscopic guidance utilizing the identical technique as utilized at the L5 vertebral body level.   Needle was then verified on lateral view and following needle placement verification on lateral view, radiofrequency testing was then carried out with motor testing at 2 Hz stimulation without evidence of stimulation of the lower extremity. Following radiofrequency testing for motor stimulation of the lower extremity, each radiofrequency probe was then prepared with 2 mL of preservative-free lidocaine and following 1 minute of anesthetizing each proposed radiofrequency lesioning  level, the radiofrequency probe was then inserted in the left, L5 vertebral body  level needle with radiofrequency lesioning carried out for 2-1/2 minutes with temperature of the tissue being maintained at 80 degrees  centigrade for 2-1/2 minutes.. The needle and probe were removed.  Radiofrequency probe was then inserted in the left, L4 vertebral body level needle and radiofrequency lesioning was performed at 80 degrees centigrade tissue temperature for 2-1/2 minutes. The needle and probe were removed  Radiofrequency probe was then inserted in the left L3 vertebral body level needle and radiofrequency lesioning was performed at 80 degrees centigrade tissue temperature for 2-1/2 minutes the needle and probe were removed The needle and probe were removed  The patient tolerated the procedure well.   PLAN: 1. Medications: The patient will continue the present medications. Neurontin Flexeril and hydrocodone acetaminophen 2. The patient will follow up with PCP Dr. Hoy Morn regarding blood pressure and general medical condition as discussed with the patient.  3. Surgical evaluation as discussed. 4. Neurological evaluation as discussed.  5. The patient has been advised to call the [facility] prior to scheduled return appointment should there be significant change in the patient's condition or should the patient have other concerns regarding condition prior to scheduled return appointment.  The patient is with understanding and is in agreement with suggested treatment plan.    Review of Systems     Objective:   Physical Exam        Assessment & Plan:

## 2016-06-13 NOTE — Patient Instructions (Addendum)
PLAN   Continue present medications Neurontin Flexeril and hydrocodone acetaminophen  F/U PCP Dr De BlanchH Grandis for evaluation of  BP and general medical  condition  F/U surgical evaluation. May consider pending follow-up evaluations  F/U neurological evaluation. May consider PNCV/EMG studies and other studies pending follow-up evaluations  F/U Dr Orion CrookZolnoun as planned. Discuss ovarian function and pelvic pain at time of return appointment   Patient is to call pain management prior to return appointment should patient have any concerns regarding condition  Radiofrequency Lesioning Radiofrequency lesioning is a procedure that is performed to relieve pain. The procedure is often used for back, neck, or arm pain. Radiofrequency lesioning involves the use of a machine that creates radio waves to make heat. During the procedure, the heat is applied to the nerve that carries the pain signal. The heat damages the nerve and interferes with the pain signal. Pain relief usually lasts for 6 months to 1 year. LET [facility] PROVIDER KNOW ABOUT:  Any allergies you have.  All medicines you are taking, including vitamins, herbs, eye drops, creams, and over-the-counter medicines.  Previous problems you or members of your family have had with the use of anesthetics.  Any blood disorders you have.  Previous surgeries you have had.  Any medical conditions you have.  Whether you are pregnant or may be pregnant. RISKS AND COMPLICATIONS Generally, this is a safe procedure. However, problems may occur, including:  Pain or soreness at the injection site.  Infection at the injection site.  Damage to nerves or blood vessels. BEFORE THE PROCEDURE  Ask your health care provider about:  Changing or stopping your regular medicines. This is especially important if you are taking diabetes medicines or blood thinners.  Taking medicines such as aspirin and ibuprofen. These medicines can thin your blood. Do  not take these medicines before your procedure if your health care provider instructs you not to.  Follow instructions from your health care provider about eating or drinking restrictions.  Plan to have someone take you home after the procedure.  If you go home right after the procedure, plan to have someone with you for 24 hours. PROCEDURE  You will be given one or more of the following:  A medicine to help you relax (sedative).  A medicine to numb the area (local anesthetic).  You will be awake during the procedure. You will need to be able to talk with the health care provider during the procedure.  With the help of a type of X-ray (fluoroscopy), the health care provider will insert a radiofrequency needle into the area to be treated.  Next, a wire that carries the radio waves (electrode) will be put through the radiofrequency needle. An electrical pulse will be sent through the electrode to verify the correct nerve. You will feel a tingling sensation, and you may have muscle twitching.  Then, the tissue that is around the needle tip will be heated by an electric current that is passed using the radiofrequency machine. This will numb the nerves.  A bandage (dressing) will be put on the insertion area after the procedure is done. The procedure may vary among health care providers and hospitals. AFTER THE PROCEDURE  Your blood pressure, heart rate, breathing rate, and blood oxygen level will be monitored often until the medicines you were given have worn off.  Return to your normal activities as directed by your health care provider.   This information is not intended to replace advice given to  you by your health care provider. Make sure you discuss any questions you have with your health care provider.   Document Released: 08/01/2011 Document Revised: 08/24/2015 Document Reviewed: 01/10/2015 Elsevier Interactive Patient Education [employer]2016 [employer]   Be careful moving about.  Muscle spasms in the area of the injection may occur.  Use ice for the next 24 hours (15 minutes on, 15 minutes off).  After 24 hours, you may use heat for comfort if you wish.  Post procedure numbness or redness is expected, average 4-6 hours.  If numbness develops after 4-6 hours and is felt to be progressing and worsening, immediately contact your physician.

## 2016-06-14 ENCOUNTER — Telehealth: Payer: Self-pay

## 2016-06-14 NOTE — Telephone Encounter (Signed)
Denies any needs at this time- instructed to call if needed 

## 2016-06-27 ENCOUNTER — Ambulatory Visit: Payer: Medicare Other | Attending: Pain Medicine | Admitting: Pain Medicine

## 2016-06-27 ENCOUNTER — Encounter: Payer: Self-pay | Admitting: Pain Medicine

## 2016-06-27 VITALS — BP 108/69 | HR 61 | Temp 97.9°F | Resp 16 | Ht 64.0 in | Wt 275.0 lb

## 2016-06-27 DIAGNOSIS — G588 Other specified mononeuropathies: Secondary | ICD-10-CM | POA: Insufficient documentation

## 2016-06-27 DIAGNOSIS — M62830 Muscle spasm of back: Secondary | ICD-10-CM | POA: Insufficient documentation

## 2016-06-27 DIAGNOSIS — M47816 Spondylosis without myelopathy or radiculopathy, lumbar region: Secondary | ICD-10-CM

## 2016-06-27 DIAGNOSIS — M533 Sacrococcygeal disorders, not elsewhere classified: Secondary | ICD-10-CM

## 2016-06-27 DIAGNOSIS — M461 Sacroiliitis, not elsewhere classified: Secondary | ICD-10-CM

## 2016-06-27 DIAGNOSIS — M545 Low back pain: Secondary | ICD-10-CM | POA: Diagnosis present

## 2016-06-27 DIAGNOSIS — M5136 Other intervertebral disc degeneration, lumbar region: Secondary | ICD-10-CM | POA: Insufficient documentation

## 2016-06-27 DIAGNOSIS — N809 Endometriosis, unspecified: Secondary | ICD-10-CM | POA: Diagnosis not present

## 2016-06-27 DIAGNOSIS — M47818 Spondylosis without myelopathy or radiculopathy, sacral and sacrococcygeal region: Secondary | ICD-10-CM

## 2016-06-27 DIAGNOSIS — M940 Chondrocostal junction syndrome [Tietze]: Secondary | ICD-10-CM

## 2016-06-27 DIAGNOSIS — M25559 Pain in unspecified hip: Secondary | ICD-10-CM

## 2016-06-27 MED ORDER — HYDROCODONE-ACETAMINOPHEN 7.5-325 MG/15ML PO SOLN: ORAL | Status: DC

## 2016-06-27 MED ORDER — HYDROCODONE-ACETAMINOPHEN 7.5-325 MG/15ML PO SOLN
ORAL | Status: DC
Start: 2016-06-27 — End: 2016-07-23

## 2016-06-27 NOTE — Progress Notes (Signed)
   Subjective:    Patient ID: [name], female    DOB: 10/25/1976, 40 y.o.   MRN: [id number]  HPI  The patient is a 40 year old female who returns to pain management for further evaluation and treatment of pain involving the mid lower back and lower extremity regions. The patient is status post radiofrequency rhizolysis lumbar facet, medial branch nerves,. The patient states that pain is fairly well-controlled at this time. The patient has had pain of the thoracic region is well with concern regarding component of pain due to intercostal neuralgia. Patient is with improvement of lumbar lower extremity pain following radiofrequency rhizolysis of the lumbar facet region. We will continue to observe response to radiofrequency procedures and we'll continue medications Neurontin Flexeril and hydrocodone acetaminophen. Patient is to call should they be change in condition prior to scheduled return appointment. The patientimprovement ability to perform twisting and turning maneuvers since radiofrequency procedure has been performed. All agreed to suggested treatment plan  Review of Systems     Objective:   Physical Exam  There was tenderness of the splenius capitis and occipitalis region palpation which reproduces mild discomfort with mild tenderness of the cervical and thoracic facet regions. No crepitus of the thoracic region was noted with palpation over the paraspinal musculature region thoracic region on the left as well as on the right. Patient appeared to be with bilaterally equal grip strength without increase of pain with Tinel and Phalen's maneuver. There was tenderness over the acromioclavicular and glenohumeral joint region a mild degree and patient was with unremarkable drop test and unremarkable Spurling's maneuver. Palpation of the thoracic region and lumbar regions reproduce mild discomfort with lateral bending rotation extension and palpation of the lumbar facets reproducing mild to  moderate discomfort. Straight leg raise was tolerates approximately 30 without increased pain with dorsiflexion noted. There was moderate tenderness of the PSIS and PII S region and minimal tenderness of the greater trochanteric region iliotibial band region. Abdomen nontender with no costovertebral tenderness      Assessment & Plan:     Degenerative disc disease lumbar spine  Lumbar facet syndrome  Ilioinguinal iliohypogastric genitofemoral neuralgia   Intercostal neuralgia  Endometriosis  Intercostal neuralgia with thoracic muscle spasm      PLAN   Continue present medication Neurontin Flexeril and hydrocodone acetaminophen  F/U PCP Dr De BlanchH Grandis for evaliation of  BP and general medical  condition  F/U surgical evaluation. May consider pending follow-up evaluations  F/U neurological evaluation. May consider PNCV/EMG studies and other studies pending follow-up evaluations  F/U Dr Orion CrookZolnoun as planned. Discuss ovarian function and pelvic pain at time of return appointment  May consider radiofrequency rhizolysis pending follow-up evaluations Awaiting insurance approval  Patient is to call pain management prior to scheduled return appointment for any concerns

## 2016-06-27 NOTE — Progress Notes (Signed)
Safety precautions to be maintained throughout the outpatient stay will include: orient to surroundings, keep bed in low position, maintain call bell within reach at all times, provide assistance with transfer out of bed and ambulation.  

## 2016-06-27 NOTE — Patient Instructions (Addendum)
PLAN   Continue present medication Neurontin Flexeril and hydrocodone acetaminophen  F/U PCP Dr De BlanchH Grandis for evaliation of  BP and general medical  condition  F/U surgical evaluation. May consider pending follow-up evaluations  F/U neurological evaluation. May consider PNCV/EMG studies and other studies pending follow-up evaluations  F/U Dr Orion CrookZolnoun as planned. Discuss ovarian function and pelvic pain at time of return appointment  May consider radiofrequency rhizolysis pending follow-up evaluations Awaiting insurance approval  Patient is to call pain management prior to scheduled return appointment for any concernsPain Management Discharge Instructions  General Discharge Instructions :  If you need to reach your doctor call: Monday-Friday 8:00 am - 4:00 pm at [phone] or toll free [phone].  After clinic hours [phone] to have operator reach doctor.  Bring all of your medication bottles to all your appointments in the pain clinic.  To cancel or reschedule your appointment with Pain Management please remember to call 24 hours in advance to avoid a fee.  Refer to the educational materials which you have been given on: General Risks, I had my Procedure. Discharge Instructions, Post Sedation.  Post Procedure Instructions:  The drugs you were given will stay in your system until tomorrow, so for the next 24 hours you should not drive, make any legal decisions or drink any alcoholic beverages.  You may eat anything you prefer, but it is better to start with liquids then soups and crackers, and gradually work up to solid foods.  Please notify your doctor immediately if you have any unusual bleeding, trouble breathing or pain that is not related to your normal pain.  Depending on the type of procedure that was done, some parts of your body may feel week and/or numb.  This usually clears up by tonight or the next day.  Walk with the use of an assistive device or accompanied  by an adult for the 24 hours.  You may use ice on the affected area for the first 24 hours.  Put ice in a Ziploc bag and cover with a towel and place against area 15 minutes on 15 minutes off.  You may switch to heat after 24 hours.

## 2016-07-23 ENCOUNTER — Ambulatory Visit: Payer: Medicare Other | Attending: Pain Medicine | Admitting: Pain Medicine

## 2016-07-23 ENCOUNTER — Encounter: Payer: Self-pay | Admitting: Pain Medicine

## 2016-07-23 VITALS — BP 116/74 | HR 61 | Temp 98.0°F | Resp 18 | Ht 64.0 in | Wt 265.0 lb

## 2016-07-23 DIAGNOSIS — M62830 Muscle spasm of back: Secondary | ICD-10-CM | POA: Insufficient documentation

## 2016-07-23 DIAGNOSIS — M5136 Other intervertebral disc degeneration, lumbar region: Secondary | ICD-10-CM | POA: Diagnosis not present

## 2016-07-23 DIAGNOSIS — M792 Neuralgia and neuritis, unspecified: Secondary | ICD-10-CM | POA: Diagnosis not present

## 2016-07-23 DIAGNOSIS — M545 Low back pain: Secondary | ICD-10-CM | POA: Diagnosis present

## 2016-07-23 DIAGNOSIS — N803 Endometriosis of pelvic peritoneum, unspecified: Secondary | ICD-10-CM

## 2016-07-23 DIAGNOSIS — M940 Chondrocostal junction syndrome [Tietze]: Secondary | ICD-10-CM

## 2016-07-23 DIAGNOSIS — M47818 Spondylosis without myelopathy or radiculopathy, sacral and sacrococcygeal region: Secondary | ICD-10-CM

## 2016-07-23 DIAGNOSIS — M47816 Spondylosis without myelopathy or radiculopathy, lumbar region: Secondary | ICD-10-CM

## 2016-07-23 DIAGNOSIS — M533 Sacrococcygeal disorders, not elsewhere classified: Secondary | ICD-10-CM

## 2016-07-23 DIAGNOSIS — N809 Endometriosis, unspecified: Secondary | ICD-10-CM | POA: Insufficient documentation

## 2016-07-23 DIAGNOSIS — M79606 Pain in leg, unspecified: Secondary | ICD-10-CM | POA: Diagnosis present

## 2016-07-23 DIAGNOSIS — M461 Sacroiliitis, not elsewhere classified: Secondary | ICD-10-CM

## 2016-07-23 MED ORDER — CYCLOBENZAPRINE HCL 10 MG PO TABS: ORAL_TABLET | ORAL | 0 refills | Status: DC

## 2016-07-23 MED ORDER — HYDROCODONE-ACETAMINOPHEN 7.5-325 MG/15ML PO SOLN: ORAL | 0 refills | Status: DC

## 2016-07-23 NOTE — Progress Notes (Signed)
Safety precautions to be maintained throughout the outpatient stay will include: orient to surroundings, keep bed in low position, maintain call bell within reach at all times, provide assistance with transfer out of bed and ambulation.  

## 2016-07-23 NOTE — Patient Instructions (Addendum)
PLAN   Continue present medication Neurontin Flexeril and hydrocodone acetaminophen  Radiofrequency rhizolysis lumbar facet, medial branch nerves to be performed at time return appointment pending insurance approval  F/U PCP Dr De BlanchH Grandis for evaliation of  BP and general medical  condition  F/U surgical evaluation. May consider pending follow-up evaluations  F/U neurological evaluation. May consider PNCV/EMG studies and other studies pending follow-up evaluations  F/U Dr Orion CrookZolnoun as planned. Discuss ovarian function and pelvic pain at time of return appointment  May consider radiofrequency rhizolysis pending follow-up evaluations Awaiting insurance approval  Patient is to call pain management prior to scheduled return appointment for any concernsRadiofrequency Lesioning Radiofrequency lesioning is a procedure that is performed to relieve pain. The procedure is often used for back, neck, or arm pain. Radiofrequency lesioning involves the use of a machine that creates radio waves to make heat. During the procedure, the heat is applied to the nerve that carries the pain signal. The heat damages the nerve and interferes with the pain signal. Pain relief usually lasts for 6 months to 1 year. LET [facility] LLC[facility]OUR [facility] CARE PROVIDER KNOW ABOUT:  Any allergies you have.  All medicines you are taking, including vitamins, herbs, eye drops, creams, and over-the-counter medicines.  Previous problems you or members of your family have had with the use of anesthetics.  Any blood disorders you have.  Previous surgeries you have had.  Any medical conditions you have.  Whether you are pregnant or may be pregnant. RISKS AND COMPLICATIONS Generally, this is a safe procedure. However, problems may occur, including:  Pain or soreness at the injection site.  Infection at the injection site.  Damage to nerves or blood vessels. BEFORE THE PROCEDURE  Ask your health care provider about:  Changing or  stopping your regular medicines. This is especially important if you are taking diabetes medicines or blood thinners.  Taking medicines such as aspirin and ibuprofen. These medicines can thin your blood. Do not take these medicines before your procedure if your health care provider instructs you not to.  Follow instructions from your health care provider about eating or drinking restrictions.  Plan to have someone take you home after the procedure.  If you go home right after the procedure, plan to have someone with you for 24 hours. PROCEDURE  You will be given one or more of the following:  A medicine to help you relax (sedative).  A medicine to numb the area (local anesthetic).  You will be awake during the procedure. You will need to be able to talk with the health care provider during the procedure.  With the help of a type of X-ray (fluoroscopy), the health care provider will insert a radiofrequency needle into the area to be treated.  Next, a wire that carries the radio waves (electrode) will be put through the radiofrequency needle. An electrical pulse will be sent through the electrode to verify the correct nerve. You will feel a tingling sensation, and you may have muscle twitching.  Then, the tissue that is around the needle tip will be heated by an electric current that is passed using the radiofrequency machine. This will numb the nerves.  A bandage (dressing) will be put on the insertion area after the procedure is done. The procedure may vary among health care providers and hospitals. AFTER THE PROCEDURE  Your blood pressure, heart rate, breathing rate, and blood oxygen level will be monitored often until the medicines you were given have worn off.  Return  to your normal activities as directed by your health care provider.   This information is not intended to replace advice given to you by your health care provider. Make sure you discuss any questions you have with  your health care provider.   Document Released: 08/01/2011 Document Revised: 08/24/2015 Document Reviewed: 01/10/2015 Elsevier Interactive Patient Education [employer]

## 2016-07-23 NOTE — Progress Notes (Signed)
    The patient is a 40 year old female who returns to pain management for further evaluation and treatment of pain involving the mid lower back lower extremity region. The patient has had return of lower back pain and we are awaiting insurance approval for radiofrequency rhizolysis lumbar facet, medial branch nerves. The patient states that she has had some pain involving the mid back region is well. Mid back pain has been felt to be due to injury to ribs. At the present time we will continue medications consisting of Neurontin Flexeril and hydrocodone acetaminophen and we will await insurance approval for lumbar facet medial branch nerve radiofrequency rhizolysis. All agreed to suggested treatment plan.    Physical examination  There was tenderness of the splenius capitis and occipitalis region of moderate degree with moderate tenderness of the cervical and thoracic facet region with evidence of unremarkable Spurling's maneuver. Patient appeared to be with bilaterally equal grip strength with Tinel and Phalen's maneuver without significant increase of pain. There was tenderness over the lumbar paraspinal must reason lumbar facet region a moderate to moderately severe degree with lateral bending rotation extension and palpation of the lumbar facets reproducing moderately severe discomfort. Straight leg raise was tolerates approximately 20 without a definite increase of pain with dorsiflexion noted. Palpation over the PSIS and PII S regions reproduce moderate severe discomfort as well. There was moderate tenderness of the greater trochanteric region iliotibial band region. There was negative clonus negative Homans. No sensory deficit or dermatomal dystrophy detected. Abdomen protuberant without excessive tends to palpation and no costovertebral tenderness noted.     Assessment  Degenerative disc disease lumbar spine  Lumbar facet syndrome  Ilioinguinal iliohypogastric genitofemoral neuralgia    Intercostal neuralgia  Endometriosis  Intercostal neuralgia with thoracic muscle spasm     PLAN   Continue present medication Neurontin Flexeril and hydrocodone acetaminophen  Radiofrequency rhizolysis lumbar facet, medial branch nerves to be performed at time return appointment pending insurance approval  F/U PCP Dr De BlanchH Grandis for evaliation of  BP and general medical  condition  F/U surgical evaluation. May consider pending follow-up evaluations  F/U neurological evaluation. May consider PNCV/EMG studies and other studies pending follow-up evaluations  F/U Dr Orion CrookZolnoun as planned. Discuss ovarian function and pelvic pain at time of return appointment  May consider radiofrequency rhizolysis pending follow-up evaluations Awaiting insurance approval  Patient is to call pain management prior to scheduled return appointment for any concerns

## 2016-07-24 ENCOUNTER — Other Ambulatory Visit: Payer: Self-pay | Admitting: Pain Medicine

## 2016-07-24 DIAGNOSIS — M47816 Spondylosis without myelopathy or radiculopathy, lumbar region: Secondary | ICD-10-CM

## 2016-07-24 DIAGNOSIS — M5136 Other intervertebral disc degeneration, lumbar region: Secondary | ICD-10-CM

## 2016-08-06 ENCOUNTER — Ambulatory Visit: Payer: Medicare Other | Attending: Pain Medicine | Admitting: Pain Medicine

## 2016-08-06 ENCOUNTER — Encounter: Payer: Self-pay | Admitting: Pain Medicine

## 2016-08-06 VITALS — BP 110/73 | HR 59 | Temp 97.2°F | Resp 14 | Ht 64.0 in | Wt 265.0 lb

## 2016-08-06 DIAGNOSIS — M47816 Spondylosis without myelopathy or radiculopathy, lumbar region: Secondary | ICD-10-CM

## 2016-08-06 DIAGNOSIS — M1288 Other specific arthropathies, not elsewhere classified, other specified site: Secondary | ICD-10-CM | POA: Diagnosis not present

## 2016-08-06 DIAGNOSIS — M545 Low back pain: Secondary | ICD-10-CM | POA: Diagnosis present

## 2016-08-06 DIAGNOSIS — M940 Chondrocostal junction syndrome [Tietze]: Secondary | ICD-10-CM

## 2016-08-06 DIAGNOSIS — M5136 Other intervertebral disc degeneration, lumbar region: Secondary | ICD-10-CM

## 2016-08-06 DIAGNOSIS — G588 Other specified mononeuropathies: Secondary | ICD-10-CM

## 2016-08-06 MED ORDER — FENTANYL CITRATE (PF) 100 MCG/2ML IJ SOLN
100.0000 ug | Freq: Once | INTRAMUSCULAR | Status: AC
  Administered 2016-08-06: 100 ug via INTRAVENOUS
  Filled 2016-08-06: qty 2

## 2016-08-06 MED ORDER — ORPHENADRINE CITRATE 30 MG/ML IJ SOLN
60.0000 mg | Freq: Once | INTRAMUSCULAR | Status: AC
  Administered 2016-08-06: 60 mg via INTRAMUSCULAR

## 2016-08-06 MED ORDER — LIDOCAINE HCL (PF) 1 % IJ SOLN
10.0000 mL | Freq: Once | INTRAMUSCULAR | Status: AC
  Administered 2016-08-06: 10 mL via SUBCUTANEOUS
  Filled 2016-08-06: qty 10

## 2016-08-06 MED ORDER — TRIAMCINOLONE ACETONIDE 40 MG/ML IJ SUSP: 40.0000 mg | Freq: Once | INTRAMUSCULAR | Status: DC

## 2016-08-06 MED ORDER — LIDOCAINE HCL (PF) 1 % IJ SOLN
INTRAMUSCULAR | Status: AC
  Filled 2016-08-06: qty 10

## 2016-08-06 MED ORDER — BUPIVACAINE HCL (PF) 0.25 % IJ SOLN
INTRAMUSCULAR | Status: AC
  Filled 2016-08-06: qty 30

## 2016-08-06 MED ORDER — LACTATED RINGERS IV SOLN
1000.0000 mL | INTRAVENOUS | Status: DC
  Administered 2016-08-06: 1000 mL via INTRAVENOUS

## 2016-08-06 MED ORDER — ORPHENADRINE CITRATE 30 MG/ML IJ SOLN
INTRAMUSCULAR | Status: AC
  Filled 2016-08-06: qty 2

## 2016-08-06 MED ORDER — BUPIVACAINE HCL (PF) 0.25 % IJ SOLN
30.0000 mL | Freq: Once | INTRAMUSCULAR | Status: AC
  Administered 2016-08-06: 30 mL

## 2016-08-06 MED ORDER — MIDAZOLAM HCL 5 MG/5ML IJ SOLN
5.0000 mg | Freq: Once | INTRAMUSCULAR | Status: AC
  Administered 2016-08-06: 5 mg via INTRAVENOUS
  Filled 2016-08-06: qty 5

## 2016-08-06 NOTE — Progress Notes (Signed)
COOLIEF RADIOFREQUENCY RHIZOLYSIS OF THE LUMBAR FACETS                             (MEDIAL BRANCH NERVES)   PROCEDURE PERFORMED: Radiofrequency rhizolysis (lunbar facet medial branch nerve radiofrequency rhizolysis).  The procedure was performed with the COOLIEF technique.  COOLIEF technique was performed using the Synergy cooled radiofrequency probe [id number]-17-150-4, the synergy cooled radiofrequency introducer SII-17-150, and the synergy cooled sterile tube kit [id number].   HISTORY: The patient is a 40 y.o. female who returns to the [facility] for further evaluation of severely disabling pain involving the lumbar and lower extremity region. Prior studies have revealed patient to be with multilevel degenerative disc disease of the lumbar spine  There is concern regarding significant component of patient's pain being due to facet arthropathy with facet syndrome. The patient has had significant relief of pain following previous lumbar facet, medial branch nerve, blocks. Radiofrequency rhizolysis of the lumbar facets, medial branch nerves, will be performed at this time in attempt to provide longer lasting relief of the patient's pain, minimize progression of the patient's symptoms, and avoid the need for more involved treatment. The risks, benefits and expectations of the procedure have been discussed and explained to the patient who was with understanding and wished to proceed with interventional treatment in an attempt to decrease severely  disabling pain of the lumbar and lower extremity region.  We will proceed with what is felt to be a medically necessary procedure, radiofrequency rhizolysis (lumbar facet medial branch nerve radiofrequency rhizolysis) using the COOLIEF technique.  DESCRIPTION OF PROCEDURE: COOLIEF Technique Radiofrequency Rhizolysis of Lumbar Facets (Medial Branch Nerves Radiofrequency Rhizolysis) with IV Versed, IV Fentanyl conscious sedation, EKG, blood pressure,  pulse, capnography, and pulse oximetry monitoring.  The procedure was performed with the patient in the prone position under fluoroscopic guidance.  Following Betadine prep of the proposed entry site, a 1.5% lidocaine plain skin wheal of the proposed needle entry site was  prepared with oblique orientation.   Left, L4 Radiofrequency Rhizolysis L4 Facet  (Medial Branch Nerve): Under fluoroscopic guidance, the radiofrequency needle was inserted at the L4 vertebral body level after a local anesthetic skin wheal of 1.5% lidocaine plain and Betadine prep of the proposed needle entry site was performed.  The needle was inserted under fluoroscopic guidance in the area known as Burton's eye or eye of the Scotty dog with needle placed at the superior and lateral border of the targeted area with oblique orientation utilizing the tunnel (gun  barrel approach) technique to the targeted area.  Left Radiofrequency Rhizolysis at L3 and L2  Lumbar Facets (Medial Branch Nerves) The procedure was performed at L3 and L2 exactly as was performed at the L4 and under fluoroscopic guidance utilizing the identical technique as utilized at the L4 vertebral body level.   Needle was then verified on lateral view and following needle placement verification on lateral view, radiofrequency testing was then carried out with motor testing at 2 Hz stimulation without evidence of stimulation of the lower extremity. Following radiofrequency testing for motor stimulation of the lower extremity, each radiofrequency probe was then prepared with 2 mL of preservative-free lidocaine and following 1 minute of anesthetizing each proposed radiofrequency lesioning  level, the radiofrequency probe was then inserted in the left, L4 vertebral body level needle with radiofrequency lesioning carried out for 2-1/2 minutes with temperature of the tissue being maintained at  80 degrees centigrade for 2-1/2 minutes.. The needle and probe were  removed.   Radiofrequency probe was then inserted in the left, L3 vertebral body level needle and radiofrequency lesioning was performed at 80 degrees centigrade tissue temperature for 2-1/2 minutes. The needle and probe were removed  Radiofrequency probe was then inserted in the left L2 vertebral body level needle and radiofrequency lesioning was performed at 80 degrees centigrade tissue temperature for 2-1/2 minutes the needle and probe were removed The needle and probe were removed   Myoneural block injection of the trapezius musculature region Following Betadine prep of proposed entry site a 22-gauge needle was inserted into the trapezius musculature region on the left and following negative aspiration 2 cc of 0.25% bupivacaine with Norflex was injected for myoneural block injection of the trapezius musculature region times two.  The patient tolerated the procedure well.   PLAN: 1. Medications: The patient will continue the present medications. Neurontin Flexeril and hydrocodone acetaminophen 2. The patient will follow up with PCP Dr. Lorre Munroe regarding blood pressure and general medical condition as discussed with the patient.  3. Surgical evaluation as discussed..F/U  Zolnoun as discussed  4. Neurological evaluation as discussed.  5. The patient has been advised to call the [facility] prior to scheduled return appointment should there be significant change in the patient's condition or should the patient have other concerns regarding condition prior to scheduled return appointment.  The patient is with understanding and is in agreement with suggested treatment plan.

## 2016-08-06 NOTE — Patient Instructions (Addendum)
PLAN   Continue present medication Neurontin Flexeril and hydrocodone acetaminophen  F/U PCP Dr De BlanchH Grandis for evaliation of  BP and general medical  condition  F/U surgical evaluation. May consider pending follow-up evaluations  F/U neurological evaluation. May consider PNCV/EMG studies and other studies pending follow-up evaluations  F/U Dr Orion CrookZolnoun as planned. Discuss ovarian function and pelvic pain at time of return appointment  May consider additional radiofrequency rhizolysis pending follow-up evaluations.  Patient is to call pain management prior to scheduled return appointment for any concerns  Pain Management Discharge Instructions  General Discharge Instructions :  If you need to reach your doctor call: Monday-Friday 8:00 am - 4:00 pm at [phone] or toll free [phone].  After clinic hours [phone] to have operator reach doctor.  Bring all of your medication bottles to all your appointments in the pain clinic.  To cancel or reschedule your appointment with Pain Management please remember to call 24 hours in advance to avoid a fee.  Refer to the educational materials which you have been given on: General Risks, I had my Procedure. Discharge Instructions, Post Sedation.  Post Procedure Instructions:  The drugs you were given will stay in your system until tomorrow, so for the next 24 hours you should not drive, make any legal decisions or drink any alcoholic beverages.  You may eat anything you prefer, but it is better to start with liquids then soups and crackers, and gradually work up to solid foods.  Please notify your doctor immediately if you have any unusual bleeding, trouble breathing or pain that is not related to your normal pain.  Depending on the type of procedure that was done, some parts of your body may feel week and/or numb.  This usually clears up by tonight or the next day.  Walk with the use of an assistive device or accompanied by an adult  for the 24 hours.  You may use ice on the affected area for the first 24 hours.  Put ice in a Ziploc bag and cover with a towel and place against area 15 minutes on 15 minutes off.  You may switch to heat after 24 hours.  Radiofrequency Lesioning Radiofrequency lesioning is a procedure that is performed to relieve pain. The procedure is often used for back, neck, or arm pain. Radiofrequency lesioning involves the use of a machine that creates radio waves to make heat. During the procedure, the heat is applied to the nerve that carries the pain signal. The heat damages the nerve and interferes with the pain signal. Pain relief usually lasts for 6 months to 1 year. LET [facility] Corp[facility]OUR HEALTH CARE PROVIDER KNOW ABOUT:  Any allergies you have.  All medicines you are taking, including vitamins, herbs, eye drops, creams, and over-the-counter medicines.  Previous problems you or members of your family have had with the use of anesthetics.  Any blood disorders you have.  Previous surgeries you have had.  Any medical conditions you have.  Whether you are pregnant or may be pregnant. RISKS AND COMPLICATIONS Generally, this is a safe procedure. However, problems may occur, including:  Pain or soreness at the injection site.  Infection at the injection site.  Damage to nerves or blood vessels. BEFORE THE PROCEDURE  Ask your health care provider about:  Changing or stopping your regular medicines. This is especially important if you are taking diabetes medicines or blood thinners.  Taking medicines such as aspirin and ibuprofen. These medicines can thin your blood. Do not take  these medicines before your procedure if your health care provider instructs you not to.  Follow instructions from your health care provider about eating or drinking restrictions.  Plan to have someone take you home after the procedure.  If you go home right after the procedure, plan to have someone with you for 24  hours. PROCEDURE  You will be given one or more of the following:  A medicine to help you relax (sedative).  A medicine to numb the area (local anesthetic).  You will be awake during the procedure. You will need to be able to talk with the health care provider during the procedure.  With the help of a type of X-ray (fluoroscopy), the health care provider will insert a radiofrequency needle into the area to be treated.  Next, a wire that carries the radio waves (electrode) will be put through the radiofrequency needle. An electrical pulse will be sent through the electrode to verify the correct nerve. You will feel a tingling sensation, and you may have muscle twitching.  Then, the tissue that is around the needle tip will be heated by an electric current that is passed using the radiofrequency machine. This will numb the nerves.  A bandage (dressing) will be put on the insertion area after the procedure is done. The procedure may vary among health care providers and hospitals. AFTER THE PROCEDURE  Your blood pressure, heart rate, breathing rate, and blood oxygen level will be monitored often until the medicines you were given have worn off.  Return to your normal activities as directed by your health care provider.   This information is not intended to replace advice given to you by your health care provider. Make sure you discuss any questions you have with your health care provider.   Document Released: 08/01/2011 Document Revised: 08/24/2015 Document Reviewed: 01/10/2015 Elsevier Interactive Patient Education [employer]2016 [employer]

## 2016-08-07 ENCOUNTER — Telehealth: Payer: Self-pay | Admitting: *Deleted

## 2016-08-07 NOTE — Telephone Encounter (Signed)
Voicemail left for patient to call our office if there are questions or concerns re; procedure on yesterday.  

## 2016-08-24 ENCOUNTER — Telehealth: Payer: Self-pay | Admitting: *Deleted

## 2016-08-27 ENCOUNTER — Ambulatory Visit: Payer: Medicare Other | Attending: Pain Medicine | Admitting: Pain Medicine

## 2016-08-27 ENCOUNTER — Encounter: Payer: Self-pay | Admitting: Pain Medicine

## 2016-08-27 VITALS — BP 107/68 | HR 66 | Temp 98.4°F | Resp 16 | Ht 64.0 in | Wt 265.0 lb

## 2016-08-27 DIAGNOSIS — N809 Endometriosis, unspecified: Secondary | ICD-10-CM | POA: Diagnosis not present

## 2016-08-27 DIAGNOSIS — M25559 Pain in unspecified hip: Secondary | ICD-10-CM

## 2016-08-27 DIAGNOSIS — M47816 Spondylosis without myelopathy or radiculopathy, lumbar region: Secondary | ICD-10-CM

## 2016-08-27 DIAGNOSIS — R102 Pelvic and perineal pain: Secondary | ICD-10-CM | POA: Diagnosis present

## 2016-08-27 DIAGNOSIS — M62830 Muscle spasm of back: Secondary | ICD-10-CM | POA: Diagnosis not present

## 2016-08-27 DIAGNOSIS — M47818 Spondylosis without myelopathy or radiculopathy, sacral and sacrococcygeal region: Secondary | ICD-10-CM

## 2016-08-27 DIAGNOSIS — M533 Sacrococcygeal disorders, not elsewhere classified: Secondary | ICD-10-CM

## 2016-08-27 DIAGNOSIS — M51369 Other intervertebral disc degeneration, lumbar region without mention of lumbar back pain or lower extremity pain: Secondary | ICD-10-CM

## 2016-08-27 DIAGNOSIS — M5136 Other intervertebral disc degeneration, lumbar region: Secondary | ICD-10-CM | POA: Insufficient documentation

## 2016-08-27 DIAGNOSIS — M940 Chondrocostal junction syndrome [Tietze]: Secondary | ICD-10-CM

## 2016-08-27 DIAGNOSIS — G588 Other specified mononeuropathies: Secondary | ICD-10-CM

## 2016-08-27 DIAGNOSIS — M461 Sacroiliitis, not elsewhere classified: Secondary | ICD-10-CM

## 2016-08-27 DIAGNOSIS — M545 Low back pain: Secondary | ICD-10-CM | POA: Diagnosis present

## 2016-08-27 MED ORDER — HYDROCODONE-ACETAMINOPHEN 7.5-325 MG/15ML PO SOLN: ORAL | 0 refills | Status: DC

## 2016-08-27 MED ORDER — GABAPENTIN 100 MG PO CAPS: 100.0000 mg | ORAL_CAPSULE | Freq: Three times a day (TID) | ORAL | 2 refills | Status: DC

## 2016-08-27 MED ORDER — CYCLOBENZAPRINE HCL 10 MG PO TABS: ORAL_TABLET | ORAL | 0 refills | Status: DC

## 2016-08-27 NOTE — Patient Instructions (Addendum)
PLAN   Continue present medication Neurontin Flexeril and hydrocodone acetaminophen  F/U PCP Dr De BlanchH Grandis for evaluation of  BP and general medical  condition  F/U surgical evaluation. May consider pending follow-up evaluations as previously discussed  F/U neurological evaluation. May consider PNCV/EMG studies and other studies pending follow-up evaluations  F/U Dr Orion CrookZolnoun as planned. Discuss ovarian function and pelvic pain at time of return appointment  We will proceed with radiofrequency rhizolysis pending follow-up evaluations Awaiting insurance approval  Patient is to call pain management prior to scheduled return appointment for any concerns

## 2016-08-27 NOTE — Progress Notes (Signed)
Safety precautions to be maintained throughout the outpatient stay will include: orient to surroundings, keep bed in low position, maintain call bell within reach at all times, provide assistance with transfer out of bed and ambulation.  

## 2016-08-28 NOTE — Progress Notes (Signed)
   The patient is a 40 year old female who returns to pain management for further evaluation and treatment of pain involving   the mid back lower back and lower extremity region with history of pelvic pain is well. At the present time we will continue present medications and consisting of Neurontin Flexeril and hydrocodone acetaminophen. The patient will undergo further evaluation including gynecological evaluation and we will remain available to consider modifications of treatment regimen pending further assessment of patient's condition. The patient is without recent trauma change in events of daily living call significant change in symptomatology. We we will continue present medications and consider modifications of treatment regimen pending follow-up evaluation. All agreed to suggested treatment plan.     Physical examination  There was tenderness over the trapezius levator scapula and rhomboid musculature regions of mild degree with mild tenderness over the cervical facet and thoracic facet regions. Palpation of the acromioclavicular and glenohumeral joint regions reproduce mild discomfort. The patient was able to perform drop test without significant difficulty and was with unremarkable Spurling's maneuver. The patient appeared to be with bilaterally equal grip strength with Tinel and Phalen's maneuver reproducing minimal discomfort. Palpation over the mid and lower thoracic region reproduced moderate discomfort without crepitus of the thoracic region. Palpation over the lumbar paraspinal musculatures and lumbar facet region was with increased pain with extension and palpation of the lumbar facets there was mild to moderate tenderness over the PSIS and PII S region as well as is mild tenderness of the greater trochanteric region iliotibial band region. Straight leg raise was tolerates a 30 without increased pain with dorsiflexion noted DTRs trace at the knees. There was negative clonus negative  Homans. Abdomen was with mild tenderness to palpation and no costovertebral angle tenderness was noted. There was negative clonus negative Homans.    Assessment  Degenerative disc disease lumbar spine  Lumbar facet syndrome  Ilioinguinal iliohypogastric genitofemoral neuralgia   Intercostal neuralgia  Endometriosis  Intercostal neuralgia with thoracic muscle spasm     PLAN   Continue present medication Neurontin Flexeril and hydrocodone acetaminophen  F/U PCP Dr De BlanchH Grandis for evaluation of  BP and general medical  condition  F/U surgical evaluation. May consider pending follow-up evaluations as previously discussed  F/U neurological evaluation. May consider PNCV/EMG studies and other studies pending follow-up evaluations  F/U Dr Orion CrookZolnoun as planned. Discuss ovarian function and pelvic pain at time of return appointment  We will consider additional radiofrequency procedures and other treatment pending response to the present treatment and follow-up evaluation. Patient has had some improvement following previous radiofrequency procedure with increased ability to perform twisting and turning maneuvers without experiencing severe disabling pain  Patient is to call pain management prior to scheduled return appointment for any concerns

## 2016-09-04 ENCOUNTER — Ambulatory Visit: Payer: Medicare Other | Admitting: Pain Medicine

## 2016-09-20 ENCOUNTER — Encounter: Payer: Medicare Other | Admitting: Pain Medicine

## 2016-09-24 ENCOUNTER — Other Ambulatory Visit: Payer: Self-pay | Admitting: Pain Medicine

## 2016-10-24 ENCOUNTER — Other Ambulatory Visit: Payer: Self-pay | Admitting: Pain Medicine

## 2016-11-05 ENCOUNTER — Other Ambulatory Visit: Payer: Self-pay | Admitting: Family Medicine

## 2016-11-05 DIAGNOSIS — N644 Mastodynia: Secondary | ICD-10-CM

## 2016-12-21 ENCOUNTER — Other Ambulatory Visit: Payer: Medicare Other

## 2016-12-21 ENCOUNTER — Ambulatory Visit: Payer: Medicare Other

## 2017-01-07 ENCOUNTER — Ambulatory Visit: Payer: Medicare Other

## 2017-01-07 ENCOUNTER — Other Ambulatory Visit: Payer: Medicare Other

## 2017-08-21 DIAGNOSIS — E538 Deficiency of other specified B group vitamins: Secondary | ICD-10-CM | POA: Insufficient documentation

## 2017-08-21 DIAGNOSIS — E781 Pure hyperglyceridemia: Secondary | ICD-10-CM | POA: Insufficient documentation

## 2017-08-21 DIAGNOSIS — E559 Vitamin D deficiency, unspecified: Secondary | ICD-10-CM | POA: Insufficient documentation

## 2017-09-22 IMAGING — CR DG CHEST 2V
1 series · 2 of 2 positions shown · non-contrast
Comparison: December 06, 2008.

CLINICAL DATA: Acute left-sided chest pain after fall last week.

EXAM:
CHEST  2 VIEW

[Series 1: w chest pa · 0.14mm/px · 2 of 2 slices shown]
[im 1/2]
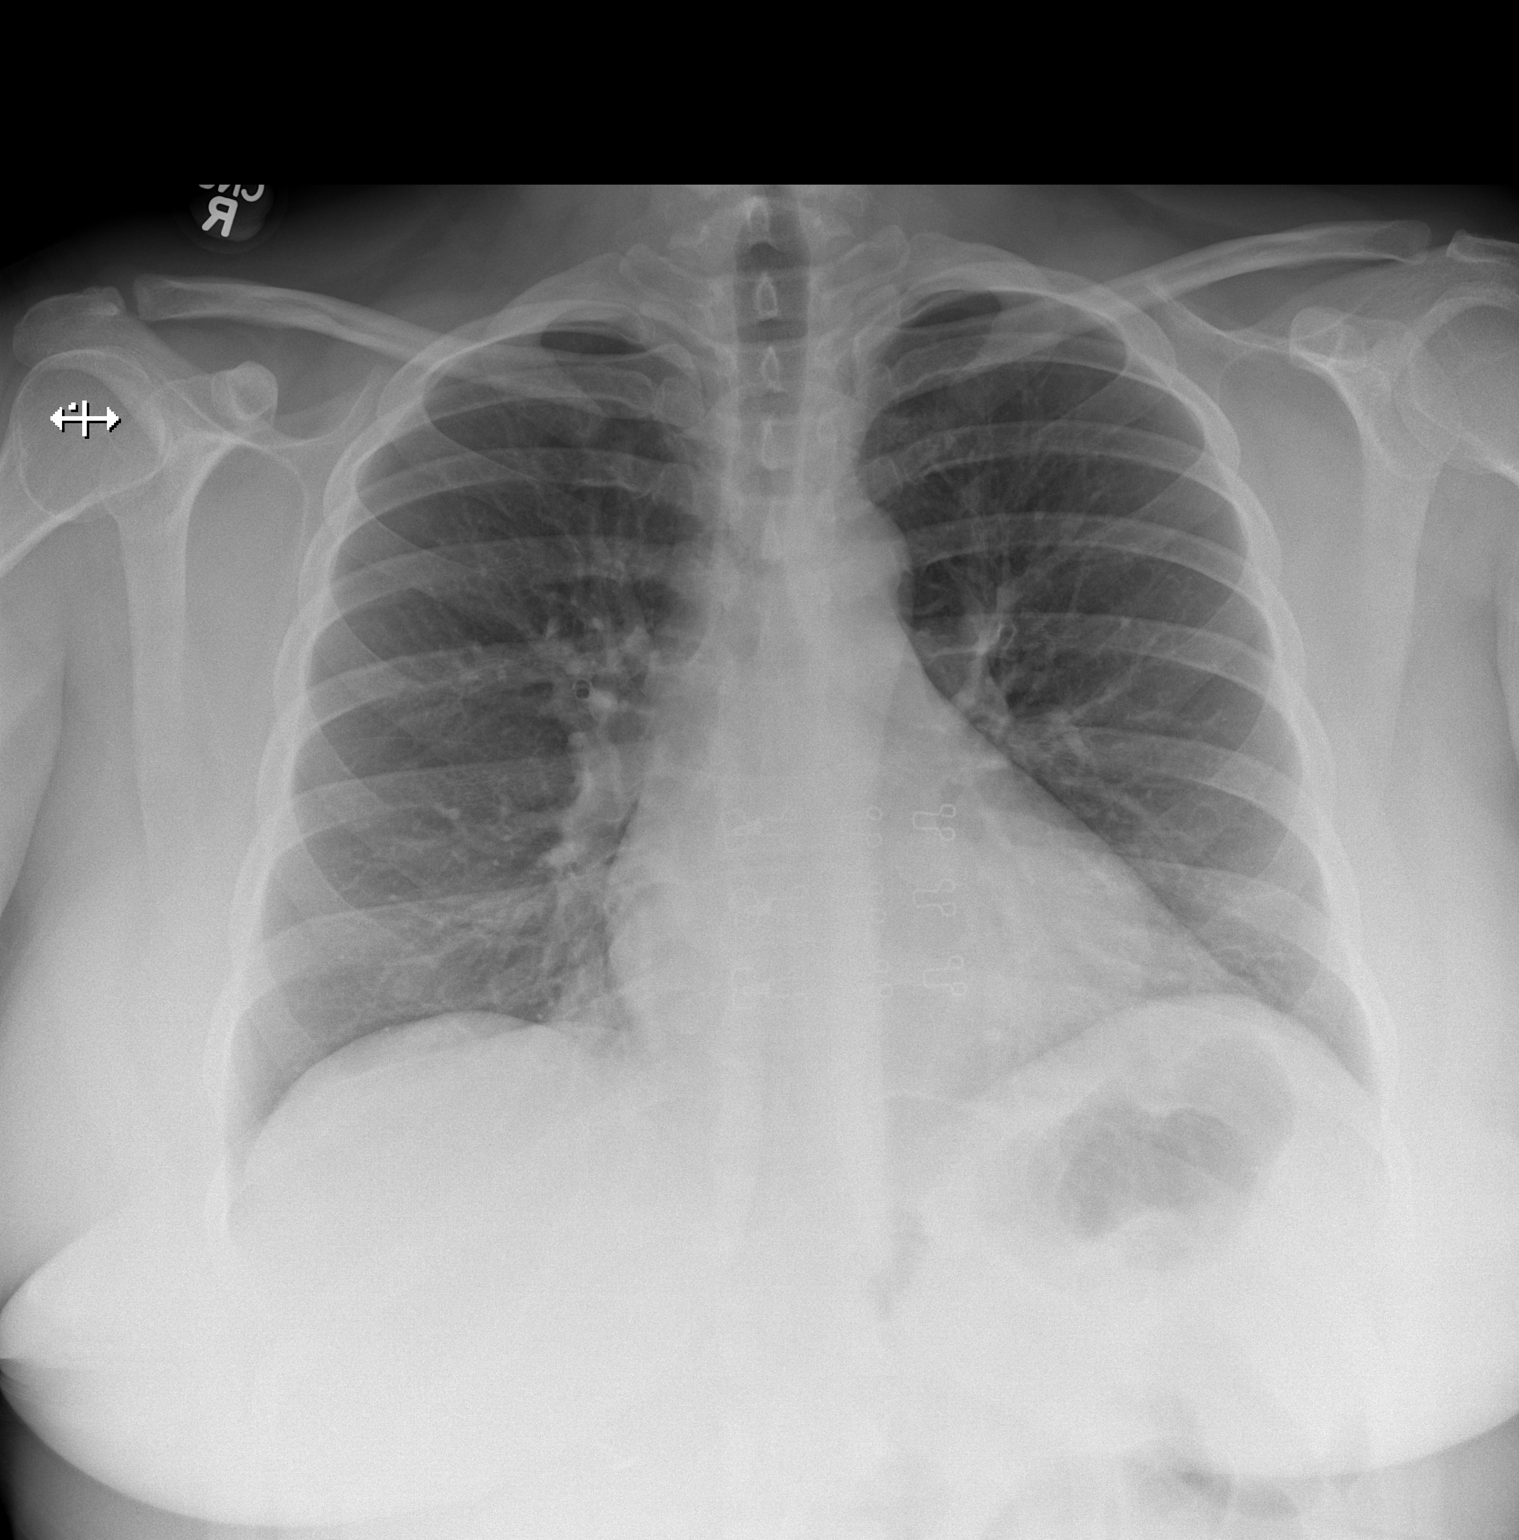
[im 2/2]
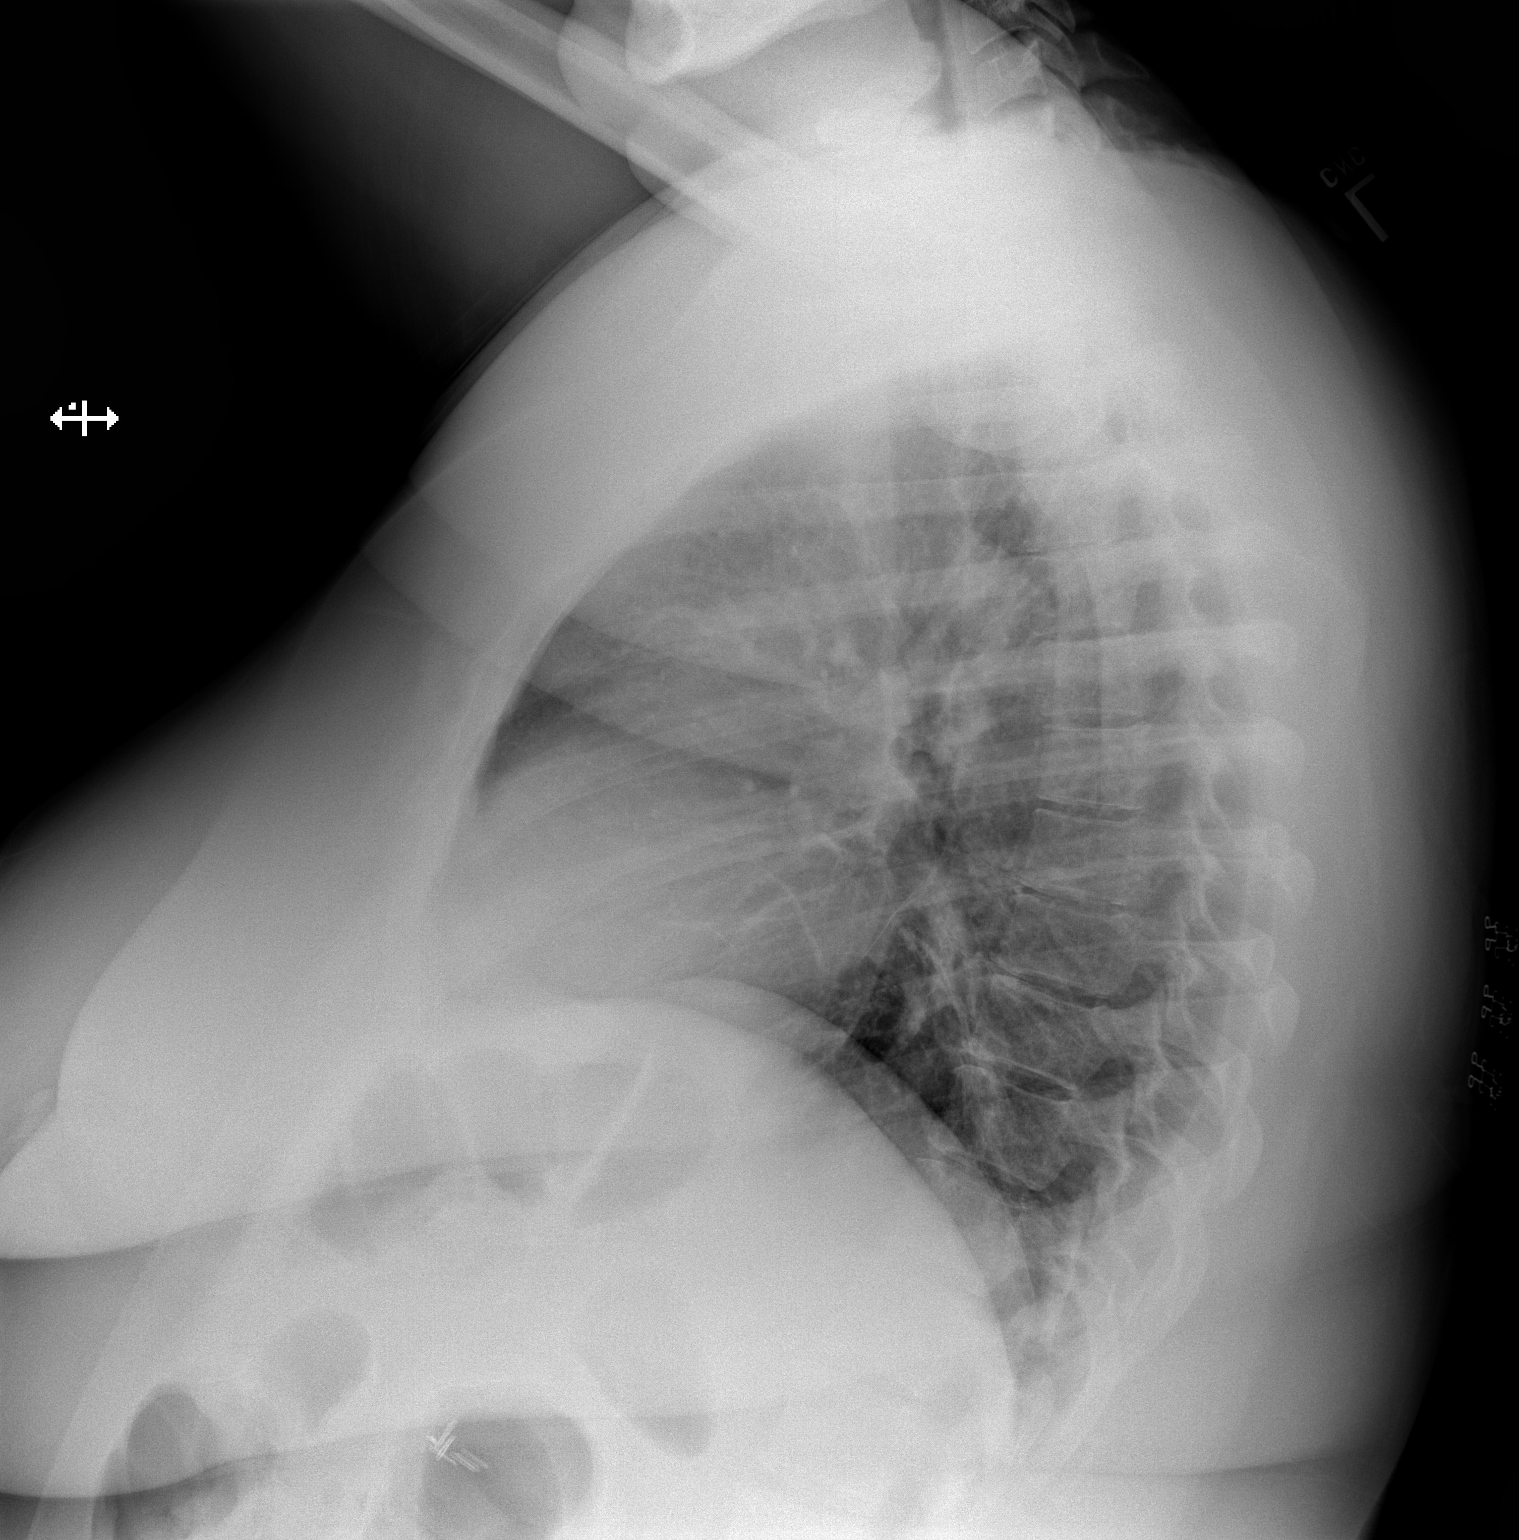

[2 of 2 positions shown; findings below may reference images not displayed]

FINDINGS: The heart size and mediastinal contours are within normal limits.
Both lungs are clear. No pneumothorax or pleural effusion is noted.
The visualized skeletal structures are unremarkable.
IMPRESSION: No active cardiopulmonary disease.

## 2019-01-27 ENCOUNTER — Encounter: Payer: Self-pay | Admitting: *Deleted

## 2019-01-28 ENCOUNTER — Encounter: Payer: Self-pay | Admitting: Neurology

## 2019-01-28 ENCOUNTER — Ambulatory Visit: Payer: Medicare Other | Admitting: Neurology

## 2019-01-28 VITALS — BP 140/72 | HR 62 | Ht 64.0 in | Wt 282.5 lb

## 2019-01-28 DIAGNOSIS — R51 Headache: Secondary | ICD-10-CM | POA: Diagnosis not present

## 2019-01-28 DIAGNOSIS — R519 Headache, unspecified: Secondary | ICD-10-CM

## 2019-01-28 DIAGNOSIS — R251 Tremor, unspecified: Secondary | ICD-10-CM | POA: Diagnosis not present

## 2019-01-28 NOTE — Progress Notes (Signed)
PATIENT: [name] DOB: 09/17/1976  Chief Complaint  Patient presents with  . Head Tremors    Reports intermittent tremors in her head.  Symptoms are not made worse by any specific activity or stress.    Marland Kitchen. Psychiatrist    Milagros EvenerKaur, Rupinder, MD - referring MD  . PCP    Rolm GalaGrandis, Heidi, MD     HISTORICAL  [name] is a 43 year old female, seen in request by her psychiatrist Dr.Kaur, Rupinder for evaluation of tremor, initial evaluation was on January 28, 2019.  I have reviewed and summarized the referring note from the referring physician.  She carries a diagnosis of bipolar disorder, has been treated with different medications, currently on polypharmacy Tegretol 200 mg 4 tablets every night, Xanax 2 mg daily as needed, gabapentin 100 mg, Norco, lamotrigine 400 mg at bedtime, lurasidone 120 mg daily,  She reported intermittent bilateral hands tremor occasionally head titubation since August 2019, intermittent, no limitation in her daily activity, worsening symptoms after her nighttime pills, she also complains of blurry vision, dizziness, sleepiness aft she took her nighttime medications.  She had long history of migraine headaches, 1-2 migraine each week, holoacranial severe pounding headache with associated light noise sensitivity, nauseous, lasting hours to days.  She has been taking Imitrex 50 mg as needed, which has been helpful.  I was able to review [facility], CMP showed mild decreased albumin, otherwise normal, LDL 95, triglyceride 221, vitamin D 30, B12 was decreased 137, normal TSH 1.59  REVIEW OF SYSTEMS: Full 14 system review of systems performed and notable only for weight gain, fatigue, blurry vision, double vision, shortness of breath, snoring, incontinence, feeling hot, joint pain, swelling, aching muscles, allergy, memory loss, headaches, slurred speech, restless leg, tremor, depression, anxiety, decreased energy,  disinterested in activities, racing thoughts.  All other review of systems were negative.  ALLERGIES: Allergies  Allergen Reactions  . Latex Nausea And Vomiting  . Nsaids Other (See Comments)    Had weight loss surgery not suppose to take nsaids    HOME MEDICATIONS: Current Outpatient Medications  Medication Sig Dispense Refill  . alprazolam (XANAX) 2 MG tablet Take 2 mg by mouth daily.     Marland Kitchen. b complex vitamins tablet Take 1 tablet by mouth daily.    . carbamazepine (TEGRETOL) 200 MG tablet Take 200 mg by mouth 4 (four) times daily. Reported on 06/13/2016    . gabapentin (NEURONTIN) 100 MG capsule Takes 2-3 capsules, 2-4 times daily.     Marland Kitchen. HYDROcodone-acetaminophen (NORCO) 7.5-325 MG tablet Take 1-2 tablets by mouth 4 (four) times daily.    Marland Kitchen. lamoTRIgine (LAMICTAL) 200 MG tablet Take 400 mg by mouth at bedtime.     . Lurasidone HCl 120 MG TABS Take 120 mg by mouth daily.    . ondansetron (ZOFRAN) 8 MG tablet Take 8 mg by mouth every 8 (eight) hours as needed for nausea or vomiting.    . pantoprazole (PROTONIX) 40 MG tablet Take 40 mg by mouth daily.     . SUMAtriptan (IMITREX) 50 MG tablet Take 50 mg by mouth every 2 (two) hours as needed for migraine. May repeat in 2 hours if headache persists or recurs.    . varenicline (CHANTIX) 1 MG tablet Take 1 mg by mouth 2 (two) times daily.     No current facility-administered medications for this visit.     PAST MEDICAL HISTORY: Past Medical History:  Diagnosis Date  .  Asthma   . Bipolar disorder (HCC)   . Chronic pain   . Depression   . Endometriosis   . Fluid retention   . Gestational diabetes   . Heart murmur   . Hot flashes   . Panic disorder   . Tremor    head    PAST SURGICAL HISTORY: Past Surgical History:  Procedure Laterality Date  . ABDOMINAL HYSTERECTOMY    . CESAREAN SECTION     x2  . CHOLECYSTECTOMY    . GASTRIC BYPASS      FAMILY HISTORY: Family History  Problem Relation Age of Onset  . Arthritis  Mother   . COPD Mother   . Depression Mother   . Diabetes Mother   . Hypertension Mother   . Depression Father   . Diabetes Father   . Heart disease Father   . Hypertension Father     SOCIAL HISTORY: Social History   Socioeconomic History  . Marital status: Married    Spouse name: Not on file  . Number of children: 5  . Years of education: some college  . Highest education level: Not on file  Occupational History  . Not on file  Social Needs  . Financial resource strain: Not on file  . Food insecurity:    Worry: Not on file    Inability: Not on file  . Transportation needs:    Medical: Not on file    Non-medical: Not on file  Tobacco Use  . Smoking status: Current Every Day Smoker    Types: Cigarettes  . Smokeless tobacco: Never Used  . Tobacco comment: vapor cigarettes  Substance and Sexual Activity  . Alcohol use: Not Currently    Alcohol/week: 0.0 standard drinks  . Drug use: No  . Sexual activity: Not on file  Lifestyle  . Physical activity:    Days per week: Not on file    Minutes per session: Not on file  . Stress: Not on file  Relationships  . Social connections:    Talks on phone: Not on file    Gets together: Not on file    Attends religious service: Not on file    Active member of club or organization: Not on file    Attends meetings of clubs or organizations: Not on file    Relationship status: Not on file  . Intimate partner violence:    Fear of current or ex partner: Not on file    Emotionally abused: Not on file    Physically abused: Not on file    Forced sexual activity: Not on file  Other Topics Concern  . Not on file  Social History Narrative   Lives at home with husband and son.   Right-handed.   Caffeine use: 1-2 cups coffee per week, 0.5 gallon of sweet tea daily.     PHYSICAL EXAM   Vitals:   01/28/19 0834  BP: 140/72  Pulse: 62  Weight: 282 lb 8 oz (128.1 kg)  Height: 5\' 4"  (1.626 m)    Not recorded      Body mass  index is 48.49 kg/m.  PHYSICAL EXAMNIATION:  Gen: NAD, conversant, well nourised, obese, well groomed                     Cardiovascular: Regular rate rhythm, no peripheral edema, warm, nontender. Eyes: Conjunctivae clear without exudates or hemorrhage Neck: Supple, no carotid bruits. Pulmonary: Clear to auscultation bilaterally   NEUROLOGICAL EXAM:  MENTAL  STATUS: Speech:    Speech is normal; fluent and spontaneous with normal comprehension.  Cognition:     Orientation to time, place and person     Normal recent and remote memory     Normal Attention span and concentration     Normal Language, naming, repeating,spontaneous speech     Fund of knowledge   CRANIAL NERVES: CN II: Visual fields are full to confrontation.  Pupils are round equal and briskly reactive to light. CN III, IV, VI: extraocular movement are normal. No ptosis. CN V: Facial sensation is intact to pinprick in all 3 divisions bilaterally. Corneal responses are intact.  CN VII: Face is symmetric with normal eye closure and smile. CN VIII: Hearing is normal to rubbing fingers CN IX, X: Palate elevates symmetrically. Phonation is normal. CN XI: Head turning and shoulder shrug are intact CN XII: Tongue is midline with normal movements and no atrophy.  MOTOR: There is no pronator drift of out-stretched arms. Muscle bulk and tone are normal. Muscle strength is normal.  REFLEXES: Reflexes are 2+ and symmetric at the biceps, triceps, knees, and ankles. Plantar responses are flexor.  SENSORY: Intact to light touch, pinprick, positional sensation and vibratory sensation are intact in fingers and toes.  COORDINATION: Rapid alternating movements and fine finger movements are intact. There is no dysmetria on finger-to-nose and heel-knee-shin.    GAIT/STANCE: Posture is normal. Gait is steady with normal steps, base, arm swing, and turning.  Mild difficulty with tandem walking due to big body habitus   DIAGNOSTIC  DATA (LABS, IMAGING, TESTING) - I reviewed patient records, labs, notes, testing and imaging myself where available.   ASSESSMENT AND PLAN  [name] is a 43 y.o. 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 Ph.D.  [facility] [address], [address] [place], [place] [address] Ph: [phone] Fax: [phone]  CC: Referring Provider

## 2019-02-02 ENCOUNTER — Telehealth: Payer: Self-pay | Admitting: Neurology

## 2019-02-02 NOTE — Telephone Encounter (Signed)
[employer] Auth: [id number] (exp. 02/02/19 to 03/03/19) patient is scheduled at [facility] for 02/11/19 arrival time is 8:30 AM patient is aware of time & day. She also has their number of [phone] and to give them a call if she needs to r/s for any reason.

## 2019-02-05 LAB — CARBAMAZEPINE LEVEL, TOTAL: Carbamazepine (Tegretol), S: 10.3 ug/mL (ref 4.0–12.0)

## 2019-02-05 LAB — LAMOTRIGINE LEVEL: LAMOTRIGINE LVL: 4.6 ug/mL (ref 2.0–20.0)

## 2019-02-05 LAB — ACETYLCHOLINE RECEPTOR AB, ALL
AChR Binding Ab, Serum: <0.03 nmol/L (ref 0.00–0.24)
Acetylchol Block Ab: 24 % (ref 0–25)
Acetylcholine Modulat Ab: <12 % (ref 0–20)

## 2019-02-10 ENCOUNTER — Telehealth: Payer: Self-pay | Admitting: Neurology

## 2019-02-11 ENCOUNTER — Ambulatory Visit: Payer: Medicare Other

## 2019-09-04 ENCOUNTER — Other Ambulatory Visit: Payer: Self-pay | Admitting: Family Medicine

## 2019-09-04 DIAGNOSIS — R0600 Dyspnea, unspecified: Secondary | ICD-10-CM

## 2019-09-14 ENCOUNTER — Other Ambulatory Visit: Payer: Self-pay

## 2019-09-14 DIAGNOSIS — Z20822 Contact with and (suspected) exposure to covid-19: Secondary | ICD-10-CM

## 2019-09-15 LAB — NOVEL CORONAVIRUS, NAA: SARS-CoV-2, NAA: NOT DETECTED

## 2019-09-22 ENCOUNTER — Ambulatory Visit: Payer: Medicare Other

## 2019-09-29 ENCOUNTER — Other Ambulatory Visit: Payer: Self-pay

## 2019-09-29 ENCOUNTER — Ambulatory Visit
Admission: RE | Admit: 2019-09-29 | Discharge: 2019-09-29 | Disposition: A | Discharge status: Home / Self Care | Payer: Medicare Other | Source: Ambulatory Visit | Attending: Family Medicine | Admitting: Family Medicine

## 2019-09-29 DIAGNOSIS — I340 Nonrheumatic mitral (valve) insufficiency: Secondary | ICD-10-CM | POA: Insufficient documentation

## 2019-09-29 DIAGNOSIS — R0600 Dyspnea, unspecified: Secondary | ICD-10-CM | POA: Insufficient documentation

## 2019-09-29 DIAGNOSIS — F319 Bipolar disorder, unspecified: Secondary | ICD-10-CM | POA: Diagnosis not present

## 2019-09-29 DIAGNOSIS — F410 Panic disorder [episodic paroxysmal anxiety] without agoraphobia: Secondary | ICD-10-CM | POA: Diagnosis not present

## 2019-09-29 NOTE — Progress Notes (Signed)
*  PRELIMINARY RESULTS* Echocardiogram 2D Echocardiogram has been performed.  Sherrie Sport 09/29/2019, 11:46 AM

## 2020-03-14 ENCOUNTER — Ambulatory Visit: Payer: Medicare Other | Attending: Internal Medicine

## 2020-03-14 DIAGNOSIS — Z23 Encounter for immunization: Secondary | ICD-10-CM

## 2020-03-14 NOTE — Progress Notes (Signed)
   Covid-19 Vaccination Clinic  Name:  JACALYNN BUZZELL    MRN: [id number] DOB: March 08, 1976  03/14/2020  Ms. Yarrow was observed post Covid-19 immunization for 15 minutes without incident. She was provided with Vaccine Information Sheet and instruction to access the V-Safe system.   Ms. Laird was instructed to call 911 with any severe reactions post vaccine: Marland Kitchen Difficulty breathing  . Swelling of face and throat  . A fast heartbeat  . A bad rash all over body  . Dizziness and weakness   Immunizations Administered    Name Date Dose VIS Date Route   Pfizer COVID-19 Vaccine 03/14/2020  4:20 PM 0.3 mL 11/27/2019 Intramuscular   Manufacturer: [employer], [employer]   Lot: [id number]   NDC: [address]-9

## 2020-04-06 ENCOUNTER — Ambulatory Visit: Payer: Medicare Other | Attending: Internal Medicine

## 2020-04-06 DIAGNOSIS — Z23 Encounter for immunization: Secondary | ICD-10-CM

## 2020-04-06 NOTE — Progress Notes (Signed)
   Covid-19 Vaccination Clinic  Name:  [name]    MRN: [id number] DOB: [birth year]  04/06/2020  Ms. [name] was observed post Covid-19 immunization for 15 minutes without incident. She was provided with Vaccine Information Sheet and instruction to access the V-Safe system.   Ms. [name] was instructed to call 911 with any severe reactions post vaccine: Marland Kitchen Difficulty breathing  . Swelling of face and throat  . A fast heartbeat  . A bad rash all over body  . Dizziness and weakness   Immunizations Administered    Name Date Dose VIS Date Route   Pfizer COVID-19 Vaccine 04/06/2020  2:41 PM 0.3 mL 02/10/2019 Intramuscular   Manufacturer: [employer], [employer]   Lot: [id number]   NDC: [address]-5

## 2020-04-27 ENCOUNTER — Other Ambulatory Visit (HOSPITAL_COMMUNITY): Payer: Self-pay | Admitting: Gastroenterology

## 2020-04-27 ENCOUNTER — Other Ambulatory Visit: Payer: Self-pay | Admitting: Gastroenterology

## 2020-04-27 DIAGNOSIS — R790 Abnormal level of blood mineral: Secondary | ICD-10-CM

## 2020-05-03 ENCOUNTER — Ambulatory Visit: Payer: Medicare Other

## 2020-05-13 ENCOUNTER — Ambulatory Visit
Admission: RE | Admit: 2020-05-13 | Discharge: 2020-05-13 | Disposition: A | Discharge status: Home / Self Care | Payer: Medicare Other | Source: Ambulatory Visit | Attending: Gastroenterology | Admitting: Gastroenterology

## 2020-05-13 ENCOUNTER — Other Ambulatory Visit: Payer: Self-pay

## 2020-05-13 DIAGNOSIS — R790 Abnormal level of blood mineral: Secondary | ICD-10-CM | POA: Insufficient documentation

## 2020-05-19 ENCOUNTER — Other Ambulatory Visit: Payer: Self-pay

## 2020-05-19 ENCOUNTER — Encounter: Payer: Self-pay | Admitting: Hematology and Oncology

## 2020-05-19 NOTE — Progress Notes (Signed)
This encounter was created in error - please disregard.

## 2020-05-19 NOTE — Progress Notes (Signed)
Patient pre screened for office appointment, no questions or concerns today. Patient reminded of upcoming appointment time and date. 

## 2020-05-20 ENCOUNTER — Inpatient Hospital Stay: Payer: Medicare Other

## 2020-05-20 ENCOUNTER — Inpatient Hospital Stay: Payer: Medicare Other | Attending: Hematology and Oncology | Admitting: Hematology and Oncology

## 2020-05-20 DIAGNOSIS — J45909 Unspecified asthma, uncomplicated: Secondary | ICD-10-CM | POA: Insufficient documentation

## 2020-05-20 DIAGNOSIS — E538 Deficiency of other specified B group vitamins: Secondary | ICD-10-CM | POA: Insufficient documentation

## 2020-05-20 DIAGNOSIS — R790 Abnormal level of blood mineral: Secondary | ICD-10-CM | POA: Insufficient documentation

## 2020-05-20 DIAGNOSIS — R42 Dizziness and giddiness: Secondary | ICD-10-CM | POA: Insufficient documentation

## 2020-05-20 DIAGNOSIS — R110 Nausea: Secondary | ICD-10-CM | POA: Insufficient documentation

## 2020-05-20 DIAGNOSIS — D509 Iron deficiency anemia, unspecified: Secondary | ICD-10-CM | POA: Insufficient documentation

## 2020-05-20 DIAGNOSIS — K5900 Constipation, unspecified: Secondary | ICD-10-CM | POA: Insufficient documentation

## 2020-05-20 DIAGNOSIS — R5383 Other fatigue: Secondary | ICD-10-CM | POA: Insufficient documentation

## 2020-05-20 DIAGNOSIS — Z79899 Other long term (current) drug therapy: Secondary | ICD-10-CM | POA: Insufficient documentation

## 2020-05-20 DIAGNOSIS — M791 Myalgia, unspecified site: Secondary | ICD-10-CM | POA: Insufficient documentation

## 2020-05-20 DIAGNOSIS — Z87891 Personal history of nicotine dependence: Secondary | ICD-10-CM | POA: Insufficient documentation

## 2020-05-20 DIAGNOSIS — K219 Gastro-esophageal reflux disease without esophagitis: Secondary | ICD-10-CM | POA: Insufficient documentation

## 2020-05-20 DIAGNOSIS — Z9884 Bariatric surgery status: Secondary | ICD-10-CM | POA: Insufficient documentation

## 2020-05-20 DIAGNOSIS — R413 Other amnesia: Secondary | ICD-10-CM | POA: Insufficient documentation

## 2020-06-01 NOTE — Progress Notes (Signed)
[facility]  [address], [address] [place], [place] [address] Phone: [phone]  Fax: [phone]   Clinic Day:  06/03/2020  Referring physician: Allean Found*  Chief Complaint: [name] is a 44 y.o. female with an abnormal copper level who is referred in consultation by Stephens November, NP for assessment and management.   HPI: The patient has a history of elevated copper levels. She was seen at [facility] clinic on 04/27/2020 by Stephens November, NP to follow up her elevated serum copper. Serum copper was 177 (80-158).   She was initially seen in the [facility] on 12/30/2019 for an elevated copper level.  Copper level had improved from 237 (72-166) on 11/18/2019 to 176 on 12/30/2019 after stopping her MVI with copper.  A work-up was initiated.  Labs included an iron saturation 46% and a TIBC of 346.5.  ANA was negative.  Mitochondrial antibody was < 20.  Smooth muscle antibody was 20 (0-19).   IgG was 813, IgA 225, IgM 176 and IgE 85.  Ceruloplasmin was 31.8 (19-39).  Urine copper  7 ug/24 hours (3-35).  She is scheduled for ophthalmology exam to evaluate for [place] rings in 06/2020.  Abdominal ultrasound on 05/13/2020 revealed a mildy echogenic liver most commonly seen with steatosis.  Steatosis can be seen with copper deposition.  There were no findings of cirrhosis.  Additional labs included a normal CMP except for a potassium 3.3 and glucose 151 on 04/17/2020.  Creatinine was 0.8.  TSH was 0.90 and a free T4 of 0.66.  B12 was 184 (low).  HIV testing was negative.  Patient began B12 injections (05/24/2020)  [facility]:  08/21/2019: Hematocrit 34.1, hemoglobin 10.7, MCV 91.0, WBC 6700.  11/18/2019: Hematocrit 39.6, hemoglobin 12.4, MCV 90.0, WBC 7500.  05/05/2020: Hematocrit 38.9, hemoglobin 12.8, MCV 94.0, WBC 7500.   Ferritin followed: 21 on 03/05/2014, 13 on 05/12/2014, 5 on 09/02/2019, 8 on 11/03/2019, 18 on  11/18/2019.  Copper followed: 237 on 11/18/2019, 176 on 12/30/2019, 177 on 04/27/2020.  Symptomatically, she constantly feels fatigued. She notes having some back pain (5/10) for which she is on pain medication. She has joint and muscle pain. She is on gabapentin. She is followed by Dr. Primus Bravo for pain management. She has memory loss which she attributes to her bipolar depression.   She has reflux. She has a decreased appetite. She can only eat small amounts of food because it make her nauseous. Her nausea has worsened since gastric bypass surgery in 2012. She has lost 40 pound in the past 6-12 months. She some constipation. She has poor balance and numbness in the front of both legs x 2 years. She has headaches and blurry vision. She has dizziness with positional change and walking.   She is currently on oral iron. She has never received IV iron. She has been receiving B12 injections every other week since 04/2020. She used to get B12 injections once a month. She had a hysterectomy in 2006.  She has no black or bloody stool. She denies hematuria. She was on 50,000 units of vitamin D but reports completing the supplement. She has an upcoming appointment with ophthalmologist in 06/2020. She reports an upcoming repeat sleep apnea test. She has had an EGD and colonoscopy (no available report).   She had the [place] COVID-19 vaccine on 03/14/2020 and 04/06/2020.    Past Medical History:  Diagnosis Date  . Asthma   . Bipolar 1 disorder ([place])   .  Bipolar disorder ([facility])   . Chronic pain   . Depression   . Endometriosis   . Fluid retention   . Gestational diabetes   . Heart murmur   . Hot flashes   . Panic disorder   . Tremor    head    Past Surgical History:  Procedure Laterality Date  . ABDOMINAL HYSTERECTOMY    . CESAREAN SECTION     x2  . CHOLECYSTECTOMY    . GASTRIC BYPASS      Family History  Problem Relation Age of Onset  . Arthritis Mother   . COPD Mother   . Depression  Mother   . Diabetes Mother   . Hypertension Mother   . Depression Father   . Diabetes Father   . Heart disease Father   . Hypertension Father   Her maternal aunt had cervical cancer. Another maternal aunt had metastatic cancer, unknown. Both maternal and paternal grandfathers had cancer, unknown. Her maternal grandmother had cancer, unknown. There is no known family history of any blood disorders.    Social History:  reports that she quit smoking about 2 years ago. Her smoking use included cigarettes. She has never used smokeless tobacco. She reports previous alcohol use. She reports that she does not use drugs. She has a 25 year long smoking history. She used to smoke half a pack to 1 pack of cigarettes per day. She drinks alcohol intermittently. She denies any exposure to raditions or toxins. 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 She is currently not working. The patient is alone today.   Allergies:  Allergies  Allergen Reactions  . Latex Nausea And Vomiting  . Nsaids Other (See Comments)    Had weight loss surgery not suppose to take nsaids    Current Medications: Current Outpatient Medications  Medication Sig Dispense Refill  . albuterol (VENTOLIN HFA) 108 (90 Base) MCG/ACT inhaler Inhale into the lungs.    Marland Kitchen alprazolam (XANAX) 2 MG tablet Take 2 mg by mouth daily.     Marland Kitchen amantadine (SYMMETREL) 100 MG capsule Take 100 mg by mouth 2 (two) times daily.    Marland Kitchen amphetamine-dextroamphetamine (ADDERALL) 20 MG tablet Take 20 mg by mouth 2 (two) times daily.    . budesonide-formoterol (SYMBICORT) 160-4.5 MCG/ACT inhaler Inhale 2 puffs into the lungs in the morning and at bedtime.    . carbamazepine (TEGRETOL) 200 MG tablet Take 200 mg by mouth 4 (four) times daily. Reported on 06/13/2016    . diclofenac Sodium (VOLTAREN) 1 % GEL APPLY 2 4 G TO PAINFUL AREA OF SKIN 4 TIMES PER DAY.    . furosemide (LASIX) 20 MG tablet Take 20 mg by mouth daily.    Marland Kitchen gabapentin  (NEURONTIN) 100 MG capsule Takes 2-3 capsules, 2-4 times daily.     Marland Kitchen HYDROcodone-acetaminophen (HYCET) 7.5-325 mg/15 ml solution SMARTSIG:1-2 Tablespoon By Mouth 4 Times Daily PRN    . HYDROcodone-acetaminophen (NORCO) 7.5-325 MG tablet Take 1-2 tablets by mouth 4 (four) times daily.    . Ipratropium-Albuterol (COMBIVENT) 20-100 MCG/ACT AERS respimat Inhale into the lungs.    . lamoTRIgine (LAMICTAL) 200 MG tablet Take 400 mg by mouth at bedtime.     . Lurasidone HCl 120 MG TABS Take 120 mg by mouth daily.    . meclizine (ANTIVERT) 25 MG tablet TAKE 1 TABLET BY MOUTH THREE TIMES DAILY AS NEEDED FOR DIZZINESS FOR UP TO 10 DAYS    . ondansetron (ZOFRAN) 8 MG  tablet Take 8 mg by mouth every 8 (eight) hours as needed for nausea or vomiting.    . ondansetron (ZOFRAN-ODT) 8 MG disintegrating tablet DISSOLVE 1 TABLET IN MOUTH EVERY 8 HOURS AS NEEDED FOR NAUSEA    . pantoprazole (PROTONIX) 40 MG tablet Take 40 mg by mouth daily.     . pantoprazole (PROTONIX) 40 MG tablet Take 1 tablet by mouth daily.    Marland Kitchen. scopolamine (TRANSDERM-SCOP) 1 MG/3DAYS 1 patch every 3 (three) days.    . SUMAtriptan (IMITREX) 50 MG tablet Take 50 mg by mouth every 2 (two) hours as needed for migraine. May repeat in 2 hours if headache persists or recurs.    Marland Kitchen. b complex vitamins tablet Take 1 tablet by mouth daily.     No current facility-administered medications for this visit.    Review of Systems  Constitutional: Positive for malaise/fatigue (constant) and weight loss (40 lbs in last 8-12 months). Negative for chills, diaphoresis and fever.  HENT: Negative for congestion, ear discharge, ear pain, hearing loss, nosebleeds, sinus pain, sore throat and tinnitus.   Eyes: Positive for blurred vision.  Respiratory: Negative for cough, hemoptysis, sputum production and shortness of breath.   Cardiovascular: Negative for chest pain, palpitations and leg swelling.  Gastrointestinal: Positive for constipation, heartburn (reflux) and  nausea (after eating small meals, worsening). Negative for abdominal pain, blood in stool, diarrhea, melena and vomiting.       Decreased appetite. Eating small meals. Gastric bypass (2012).  Genitourinary: Negative for dysuria, frequency, hematuria and urgency.       Hysterectomy 2006. No menstrual cycles.  Musculoskeletal: Positive for back pain (5/10), joint pain and myalgias. Negative for neck pain.  Skin: Negative for itching and rash.  Neurological: Positive for dizziness (positional changes, when walking), sensory change (on gabapentin, front bilateral leg numbness) and headaches. Negative for tingling and weakness.       Poor balance.  Endo/Heme/Allergies: Does not bruise/bleed easily.  Psychiatric/Behavioral: Positive for depression (bipolar depression) and memory loss. The patient is not nervous/anxious and does not have insomnia.   All other systems reviewed and are negative.  Performance status (ECOG): 2-3  Vitals Blood pressure 110/60, pulse (!) 52, temperature (!) 95.6 F (35.3 C), temperature source Tympanic, weight 264 lb 3.5 oz (119.8 kg), SpO2 100 %.   Physical Exam Vitals and nursing note reviewed.  Constitutional:      General: She is not in acute distress.    Appearance: Normal appearance.     Interventions: Face mask in place.  HENT:     Head: Normocephalic and atraumatic.     Comments: Long black hair pulled back.    Mouth/Throat:     Mouth: Mucous membranes are moist.     Pharynx: Oropharynx is clear. No oropharyngeal exudate.  Eyes:     General: No scleral icterus.    Extraocular Movements: Extraocular movements intact.     Conjunctiva/sclera: Conjunctivae normal.     Pupils: Pupils are equal, round, and reactive to light.     Comments: Black rimmed glasses.  Cardiovascular:     Rate and Rhythm: Normal rate and regular rhythm.     Pulses: Normal pulses.     Heart sounds: Normal heart sounds. No murmur heard.   Pulmonary:     Effort: Pulmonary effort  is normal. No respiratory distress.     Breath sounds: Normal breath sounds. No wheezing or rales.  Chest:     Chest wall: No tenderness.  Abdominal:  General: Bowel sounds are normal. There is no distension.     Palpations: Abdomen is soft. There is no mass.     Tenderness: There is no abdominal tenderness. There is no guarding.  Musculoskeletal:        General: No swelling. Normal range of motion.     Cervical back: Normal range of motion and neck supple. Tenderness present.     Thoracic back: Tenderness present.     Lumbar back: Tenderness present.  Lymphadenopathy:     Head:     Right side of head: No preauricular, posterior auricular or occipital adenopathy.     Left side of head: No preauricular, posterior auricular or occipital adenopathy.     Cervical: No cervical adenopathy.     Upper Body:     Right upper body: No supraclavicular adenopathy.     Left upper body: No supraclavicular adenopathy.     Lower Body: No right inguinal adenopathy. No left inguinal adenopathy.  Skin:    General: Skin is warm and dry.     Comments: Tattoo.  Neurological:     Mental Status: She is alert and oriented to person, place, and time. Mental status is at baseline.  Psychiatric:        Mood and Affect: Mood normal.        Behavior: Behavior normal.        Thought Content: Thought content normal.        Judgment: Judgment normal.     No visits with results within 3 Day(s) from this visit.  Latest known visit with results is:  Orders Only on 09/14/2019  Component Date Value Ref Range Status  . SARS-CoV-2, NAA 09/14/2019 Not Detected  Not Detected Final   Comment: This nucleic acid amplification test was developed and its performance characteristics determined by [employer]. Nucleic acid amplification tests include PCR and TMA. This test has not been FDA cleared or approved. This test has been authorized by FDA under an Emergency Use Authorization (EUA). This test is only  authorized for the duration of time the declaration that circumstances exist justifying the authorization of the emergency use of in vitro diagnostic tests for detection of SARS-CoV-2 virus and/or diagnosis of COVID-19 infection under section 564(b)(1) of the Act, 21 U.S.C. 643PIR-5(J) (1), unless the authorization is terminated or revoked sooner. When diagnostic testing is negative, the possibility of a false negative result should be considered in the context of a patient's recent exposures and the presence of clinical signs and symptoms consistent with COVID-19. An individual without symptoms of COVID-19 and who is not shedding SARS-CoV-2 virus would                           expect to have a negative (not detected) result in this assay.     Assessment:  [name] is a 44 y.o. female with a history of gastric bypass surgery (2012) with an elevated serum copper level.  Copper has been followed (80-158): 237 on 11/18/2019, 176 on 12/30/2019, 177 on 04/27/2020.  Abdominal ultrasound on 05/13/2020 revealed a mildy echogenic liver most commonly seen with steatosis.  Steatosis can be seen with copper deposition.  There were no findings of cirrhosis.  She has B12 deficiency. B12 was 184 (low).  She began B12 injections (05/24/2020)  She has iron deficiency.   Ferritin has been followed: 21 on 03/05/2014, 13 on 05/12/2014, 5 on 09/02/2019, 8 on 11/03/2019, 18 on 11/18/2019.  She had the Pfizer COVID-19 vaccine on 03/14/2020 and 04/06/2020.   Symptomatically, she feels fatigued.  She has a decreased appetite and nausea.  She has lost 40 pound in the past 6-12 months. Exam is unremarkable.  Plan: 1.   Labs today: CBC with diff, ferritin, iron studies, B12, folate, copper, zinc. 2.   Elevated serum copper  Etiology unclear   Differential includes supplements, liver/biliary disease, thyroid dysfunction, infection, leukemia/lymphom, RA.  Follow copper level off supplementation.   Discuss additional testing. 3.   H/o gastric bypass surgery  Patient at risk for iron deficiency and vitamin B12 deficiency.  Check levels. 4.   RTC in 10 days for MD assessment, review of work-up and discussion regarding direction of therapy.  I discussed the assessment and treatment plan with the patient.  The patient was provided an opportunity to ask questions and all were answered.  The patient agreed with the plan and demonstrated an understanding of the instructions.  The patient was advised to call back if the symptoms worsen or if the condition fails to improve as anticipated.  I provided 25 minutes of face-to-face time during this this encounter and > 50% was spent counseling as documented under my assessment and plan.  An additional 10 minutes were spent reviewing her chart (Epic and Care Everywhere) including notes, labs, and imaging studies.    Melissa C. Merlene Pulling, MD, PhD    06/03/2020, 9:15 AM  I, Theador Hawthorne, am acting as scribe for [employer]. Merlene Pulling, MD, PhD.  I, Melissa C. Merlene Pulling, MD, have reviewed the above documentation for accuracy and completeness, and I agree with the above.

## 2020-06-03 ENCOUNTER — Other Ambulatory Visit: Payer: Self-pay

## 2020-06-03 ENCOUNTER — Inpatient Hospital Stay: Payer: Medicare Other

## 2020-06-03 ENCOUNTER — Inpatient Hospital Stay: Payer: Medicare Other | Admitting: Hematology and Oncology

## 2020-06-03 ENCOUNTER — Encounter: Payer: Self-pay | Admitting: Hematology and Oncology

## 2020-06-03 VITALS — BP 110/60 | HR 52 | Temp 95.6°F | Wt 264.2 lb

## 2020-06-03 DIAGNOSIS — R110 Nausea: Secondary | ICD-10-CM | POA: Diagnosis not present

## 2020-06-03 DIAGNOSIS — E538 Deficiency of other specified B group vitamins: Secondary | ICD-10-CM | POA: Diagnosis not present

## 2020-06-03 DIAGNOSIS — R5383 Other fatigue: Secondary | ICD-10-CM | POA: Diagnosis not present

## 2020-06-03 DIAGNOSIS — R790 Abnormal level of blood mineral: Secondary | ICD-10-CM

## 2020-06-03 DIAGNOSIS — Z87891 Personal history of nicotine dependence: Secondary | ICD-10-CM | POA: Diagnosis not present

## 2020-06-03 DIAGNOSIS — Z79899 Other long term (current) drug therapy: Secondary | ICD-10-CM | POA: Diagnosis not present

## 2020-06-03 DIAGNOSIS — D509 Iron deficiency anemia, unspecified: Secondary | ICD-10-CM | POA: Diagnosis not present

## 2020-06-03 DIAGNOSIS — Z9884 Bariatric surgery status: Secondary | ICD-10-CM | POA: Diagnosis not present

## 2020-06-03 DIAGNOSIS — K5900 Constipation, unspecified: Secondary | ICD-10-CM | POA: Diagnosis not present

## 2020-06-03 DIAGNOSIS — K219 Gastro-esophageal reflux disease without esophagitis: Secondary | ICD-10-CM | POA: Diagnosis not present

## 2020-06-03 DIAGNOSIS — M791 Myalgia, unspecified site: Secondary | ICD-10-CM | POA: Diagnosis not present

## 2020-06-03 DIAGNOSIS — R42 Dizziness and giddiness: Secondary | ICD-10-CM | POA: Diagnosis not present

## 2020-06-03 DIAGNOSIS — R413 Other amnesia: Secondary | ICD-10-CM | POA: Diagnosis not present

## 2020-06-03 DIAGNOSIS — J45909 Unspecified asthma, uncomplicated: Secondary | ICD-10-CM | POA: Diagnosis not present

## 2020-06-03 LAB — VITAMIN B12: Vitamin B-12: 339 pg/mL (ref 180–914)

## 2020-06-03 LAB — CBC WITH DIFFERENTIAL/PLATELET
Abs Immature Granulocytes: 0.01 10*3/uL (ref 0.00–0.07)
Basophils Absolute: 0 10*3/uL (ref 0.0–0.1)
Basophils Relative: 0 %
Eosinophils Absolute: 0.1 10*3/uL (ref 0.0–0.5)
Eosinophils Relative: 1 %
HCT: 36.7 % (ref 36.0–46.0)
Hemoglobin: 12.5 g/dL (ref 12.0–15.0)
Immature Granulocytes: 0 %
Lymphocytes Relative: 41 %
Lymphs Abs: 2.8 10*3/uL (ref 0.7–4.0)
MCH: 31.6 pg (ref 26.0–34.0)
MCHC: 34.1 g/dL (ref 30.0–36.0)
MCV: 92.7 fL (ref 80.0–100.0)
Monocytes Absolute: 0.4 10*3/uL (ref 0.1–1.0)
Monocytes Relative: 6 %
Neutro Abs: 3.6 10*3/uL (ref 1.7–7.7)
Neutrophils Relative %: 52 %
Platelets: 268 10*3/uL (ref 150–400)
RBC: 3.96 MIL/uL (ref 3.87–5.11)
RDW: 12.4 % (ref 11.5–15.5)
WBC: 7 10*3/uL (ref 4.0–10.5)
nRBC: 0 % (ref 0.0–0.2)

## 2020-06-03 LAB — IRON AND TIBC
Iron: 137 ug/dL (ref 28–170)
Saturation Ratios: 38 % — ABNORMAL HIGH (ref 10.4–31.8)
TIBC: 364 ug/dL (ref 250–450)
UIBC: 227 ug/dL

## 2020-06-03 LAB — FERRITIN: Ferritin: 19 ng/mL (ref 11–307)

## 2020-06-03 LAB — FOLATE: Folate: 9.7 ng/mL (ref >5.9)

## 2020-06-03 LAB — CORTISOL: Cortisol, Plasma: 6 ug/dL

## 2020-06-03 NOTE — Progress Notes (Signed)
Patient here for initial oncology appointment, expresses complaints of back pain, memory loss, constipation, constant fatigue.

## 2020-06-07 LAB — ZINC: Zinc: 91 ug/dL (ref 44–115)

## 2020-06-07 LAB — COPPER, SERUM: Copper: 166 ug/dL — ABNORMAL HIGH (ref 80–158)

## 2020-06-10 LAB — HEMOCHROMATOSIS DNA-PCR(C282Y,H63D)

## 2020-06-13 ENCOUNTER — Inpatient Hospital Stay: Payer: Medicare Other | Admitting: Hematology and Oncology

## 2020-06-13 DIAGNOSIS — E2740 Unspecified adrenocortical insufficiency: Secondary | ICD-10-CM | POA: Insufficient documentation

## 2020-06-13 DIAGNOSIS — R790 Abnormal level of blood mineral: Secondary | ICD-10-CM | POA: Insufficient documentation

## 2020-06-13 DIAGNOSIS — R7989 Other specified abnormal findings of blood chemistry: Secondary | ICD-10-CM | POA: Insufficient documentation

## 2020-06-20 NOTE — Progress Notes (Deleted)
[facility]  [address] [place], [place] [address] Phone: [phone]  Fax: [phone]   Clinic Day:  06/20/2020  Referring physician: Rolm Gala, MD  Chief Complaint: [name] is a 44 y.o. female with an abnormal copper level who is seen today for eview of work-up and discussion regarding direction of therapy.  HPI: The patient was last seen at the hematology clinic on 06/03/2020 for a new patient assessment. At that time, she was seen for an elevated copper level that had improved slightly after discontinuation of MVI with copper.  She was noted to have an abnormal iron saturation   Work-up revealed a hematocrit was 36.7, hemoglobin 12.5, platelets 268,000, WBC 7000. Ferritin was 19 with an iron saturation of 38% (high) and a TIBC of 364. Cortisol was 6.0 (low). Zinc was 91. Folate was 9.7. Vitamin B12 was 339 (low normal). Copper was 166 (80-158).  Hemochromatosis assay revealed no mutation (C282Y, H63D, S65C).  During the interim, ***   Past Medical History:  Diagnosis Date  . Asthma   . Bipolar 1 disorder (HCC)   . Bipolar disorder (HCC)   . Chronic pain   . Depression   . Endometriosis   . Fluid retention   . Gestational diabetes   . Heart murmur   . Hot flashes   . Panic disorder   . Tremor    head    Past Surgical History:  Procedure Laterality Date  . ABDOMINAL HYSTERECTOMY    . CESAREAN SECTION     x2  . CHOLECYSTECTOMY    . GASTRIC BYPASS      Family History  Problem Relation Age of Onset  . Arthritis Mother   . COPD Mother   . Depression Mother   . Diabetes Mother   . Hypertension Mother   . Depression Father   . Diabetes Father   . Heart disease Father   . Hypertension Father     Social History:  reports that she quit smoking about 2 years ago. Her smoking use included cigarettes. She has never used smokeless tobacco. She reports previous alcohol use. She reports that she does not use drugs. She  has a 25 year long smoking history. She used to smoke half a pack to 1 pack of cigarettes per day. She drinks alcohol intermittently. Her maternal aunt had cervical cancer. Another maternal aunt had metastatic cancer, unknown. Both maternal and paternal grandfathers had cancer, unknown. Her maternal grandmother had cancer, unknown. There is no known family history of any blood disorders.  She denies any exposure to raditions or toxins. 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 She is currently not working. The patient is alone*** today.   Allergies:  Allergies  Allergen Reactions  . Latex Nausea And Vomiting  . Nsaids Other (See Comments)    Had weight loss surgery not suppose to take nsaids    Current Medications: Current Outpatient Medications  Medication Sig Dispense Refill  . albuterol (VENTOLIN HFA) 108 (90 Base) MCG/ACT inhaler Inhale into the lungs.    Marland Kitchen alprazolam (XANAX) 2 MG tablet Take 2 mg by mouth daily.     Marland Kitchen amantadine (SYMMETREL) 100 MG capsule Take 100 mg by mouth 2 (two) times daily.    Marland Kitchen amphetamine-dextroamphetamine (ADDERALL) 20 MG tablet Take 20 mg by mouth 2 (two) times daily.    Marland Kitchen b complex vitamins tablet Take 1 tablet by mouth daily.    . budesonide-formoterol (  SYMBICORT) 160-4.5 MCG/ACT inhaler Inhale 2 puffs into the lungs in the morning and at bedtime.    . carbamazepine (TEGRETOL) 200 MG tablet Take 200 mg by mouth 4 (four) times daily. Reported on 06/13/2016    . diclofenac Sodium (VOLTAREN) 1 % GEL APPLY 2 4 G TO PAINFUL AREA OF SKIN 4 TIMES PER DAY.    . furosemide (LASIX) 20 MG tablet Take 20 mg by mouth daily.    Marland Kitchen gabapentin (NEURONTIN) 100 MG capsule Takes 2-3 capsules, 2-4 times daily.     Marland Kitchen HYDROcodone-acetaminophen (HYCET) 7.5-325 mg/15 ml solution SMARTSIG:1-2 Tablespoon By Mouth 4 Times Daily PRN    . HYDROcodone-acetaminophen (NORCO) 7.5-325 MG tablet Take 1-2 tablets by mouth 4 (four) times daily.    .  Ipratropium-Albuterol (COMBIVENT) 20-100 MCG/ACT AERS respimat Inhale into the lungs.    . lamoTRIgine (LAMICTAL) 200 MG tablet Take 400 mg by mouth at bedtime.     . Lurasidone HCl 120 MG TABS Take 120 mg by mouth daily.    . meclizine (ANTIVERT) 25 MG tablet TAKE 1 TABLET BY MOUTH THREE TIMES DAILY AS NEEDED FOR DIZZINESS FOR UP TO 10 DAYS    . ondansetron (ZOFRAN) 8 MG tablet Take 8 mg by mouth every 8 (eight) hours as needed for nausea or vomiting.    . ondansetron (ZOFRAN-ODT) 8 MG disintegrating tablet DISSOLVE 1 TABLET IN MOUTH EVERY 8 HOURS AS NEEDED FOR NAUSEA    . pantoprazole (PROTONIX) 40 MG tablet Take 40 mg by mouth daily.     . pantoprazole (PROTONIX) 40 MG tablet Take 1 tablet by mouth daily.    Marland Kitchen scopolamine (TRANSDERM-SCOP) 1 MG/3DAYS 1 patch every 3 (three) days.    . SUMAtriptan (IMITREX) 50 MG tablet Take 50 mg by mouth every 2 (two) hours as needed for migraine. May repeat in 2 hours if headache persists or recurs.     No current facility-administered medications for this visit.    Review of Systems  Constitutional: Positive for malaise/fatigue (constant) and weight loss (40 lbs in last 8-12 months). Negative for chills, diaphoresis and fever.  HENT: Negative for congestion, ear discharge, ear pain, hearing loss, nosebleeds, sinus pain, sore throat and tinnitus.   Eyes: Positive for blurred vision.  Respiratory: Negative for cough, hemoptysis, sputum production and shortness of breath.   Cardiovascular: Negative for chest pain, palpitations and leg swelling.  Gastrointestinal: Positive for constipation, heartburn (reflux) and nausea (after eating small meals, worsening). Negative for abdominal pain, blood in stool, diarrhea, melena and vomiting.       Decreased appetite. Eating small meals. Gastric bypass (2012).  Genitourinary: Negative for dysuria, frequency, hematuria and urgency.       Hysterectomy 2006. No menstrual cycles.  Musculoskeletal: Positive for back pain  (5/10), joint pain and myalgias. Negative for neck pain.  Skin: Negative for itching and rash.  Neurological: Positive for dizziness (positional changes, when walking), sensory change (on gabapentin, front bilateral leg numbness) and headaches. Negative for tingling and weakness.       Poor balance.  Endo/Heme/Allergies: Does not bruise/bleed easily.  Psychiatric/Behavioral: Positive for depression (bipolar deperssion) and memory loss. The patient is not nervous/anxious and does not have insomnia.   All other systems reviewed and are negative.   Performance status (ECOG): 2-3***  Vitals There were no vitals taken for this visit.   Physical Exam Vitals and nursing note reviewed.  Constitutional:      General: She is not in acute distress.  Appearance: Normal appearance.     Interventions: Face mask in place.  HENT:     Head: Normocephalic and atraumatic.     Comments: Long black hair pulled back.    Mouth/Throat:     Mouth: Mucous membranes are moist.     Pharynx: Oropharynx is clear. No oropharyngeal exudate.  Eyes:     General: No scleral icterus.    Extraocular Movements: Extraocular movements intact.     Conjunctiva/sclera: Conjunctivae normal.     Pupils: Pupils are equal, round, and reactive to light.     Comments: Black rimmed glasses.  Cardiovascular:     Rate and Rhythm: Normal rate and regular rhythm.     Pulses: Normal pulses.     Heart sounds: Normal heart sounds. No murmur heard.   Pulmonary:     Effort: Pulmonary effort is normal. No respiratory distress.     Breath sounds: Normal breath sounds. No wheezing or rales.  Chest:     Chest wall: No tenderness.  Abdominal:     General: Bowel sounds are normal. There is no distension.     Palpations: Abdomen is soft. There is no mass.     Tenderness: There is no abdominal tenderness. There is no guarding.  Musculoskeletal:        General: No swelling. Normal range of motion.     Cervical back: Normal range of  motion and neck supple. Tenderness present.     Thoracic back: Tenderness present.     Lumbar back: Tenderness present.  Lymphadenopathy:     Head:     Right side of head: No preauricular, posterior auricular or occipital adenopathy.     Left side of head: No preauricular, posterior auricular or occipital adenopathy.     Cervical: No cervical adenopathy.     Upper Body:     Right upper body: No supraclavicular adenopathy.     Left upper body: No supraclavicular adenopathy.     Lower Body: No right inguinal adenopathy. No left inguinal adenopathy.  Skin:    General: Skin is warm and dry.  Neurological:     Mental Status: She is alert and oriented to person, place, and time. Mental status is at baseline.  Psychiatric:        Mood and Affect: Mood normal.        Behavior: Behavior normal.        Thought Content: Thought content normal.        Judgment: Judgment normal.     No visits with results within 3 Day(s) from this visit.  Latest known visit with results is:  Appointment on 06/03/2020  Component Date Value Ref Range Status  . Cortisol, Plasma 06/03/2020 6.0  ug/dL Final   Comment: (NOTE) AM    6.7 - 22.6 ug/dL PM   <01.0       ug/dL Performed at [facility], [address], [place], [place] [address]   . Zinc 06/03/2020 91  44 - 115 ug/dL Final   Comment: (NOTE) This test was developed and its performance characteristics determined by [facility]. It has not been cleared or approved by the Food and Drug Administration.                                Detection Limit = 5 Performed At: [facility] [address] [place], [place] [id number] Jolene Schimke MD [id number]   . Copper 06/03/2020 166*  80 - 158 ug/dL Final   Comment: (NOTE) This test was developed and its performance characteristics determined by [facility]. It has not been cleared or approved by the Food and Drug Administration.                                Detection Limit = 5 Performed At:  [facility] [address] [place], [place] [id number] Jolene Schimke MD [id number]   . DNA Mutation Analysis 06/03/2020 Comment   Final   Comment: (NOTE) NO MUTATION IDENTIFIED Interpretation: This patient's sample was analyzed for the hereditary hemochromatosis (HH) mutations C282Y, H63D, S65C. No mutation was identified. The mutations analyzed by [facility] are most common in the Caucasian population, and up to 90% of affected Caucasians will have a positive test result. Because this panel does not identify rare HH mutations or HH mutations found in other ethnic groups, there are a small number of people who may have a negative test but may actually be affected. The diagnosis of HH should include clinical findings and other test results such as transferrin-iron saturation and/or serum ferritin studies and/or liver biopsy.  If this patient has a history of HH, in many cases a specific carrier risk can be determined based on this negative result. Methodology: DNA Analysis of the HFE gene was performed by PCR amplification followed by restriction enzyme digestion analyses. Reference: Justice Rocher and Walker AP. (20                          00). Genet Test 4:97-101. Cogswell ME et al. (1999). AM J Prev Med 16:134-140. Bomford A (2002). Lancet 360(9346):1673-81. Dorina Hoyer et al. (2002). Blood Cells, Molecules. and Diseases.  29(3):418-432. Imperatore G et al. (2003). Genet Med. 5(1):1-8. Cogswell ME et al. (2003). Genet Med. 5(4):304-10. This test was developed and its performance characteristics determined by [facility]. It has not been cleared or approved by the Food and Drug Administration. Genetic counselors are available for health care providers to discuss results at 1-800-345-GENE. Vincenza Hews, PhD, [facility] Ernestene Mention, PhD, [facility] Gillian Shields, PhD, [facility] Manya Silvas, PhD, [facility] Bonnielee Haff, PhD, [facility] Alpha Gula, PhD, [facility] Performed  At: [facility] [address] [place], [place] [id number] Maurine Simmering MDPhD [id number]   . Folate 06/03/2020 9.7  >5.9 ng/mL Final   Performed at [facility], [address] [place] [place], [place] [address]  . Vitamin B-12 06/03/2020 339  180 - 914 pg/mL Final   Comment: (NOTE) This assay is not validated for testing neonatal or myeloproliferative syndrome specimens for Vitamin B12 levels. Performed at [facility], [address] [place], [place] [address]   . Iron 06/03/2020 137  28 - 170 ug/dL Final  . TIBC 85/27/7824 364  250 - 450 ug/dL Final  . Saturation Ratios 06/03/2020 38* 10.4 - 31.8 % Final  . UIBC 06/03/2020 227  ug/dL Final   Performed at [facility], [address] [place], [place] [address]  . Ferritin 06/03/2020 19  11 - 307 ng/mL Final   Performed at [facility], [address] [place] [place], [place] [address]  . WBC 06/03/2020 7.0  4.0 - 10.5 K/uL Final  . RBC 06/03/2020 3.96  3.87 - 5.11 MIL/uL Final  . Hemoglobin 06/03/2020 12.5  12.0 - 15.0 g/dL Final  . HCT 54/00/8676 36.7  36 - 46 % Final  .  MCV 06/03/2020 92.7  80.0 - 100.0 fL Final  . MCH 06/03/2020 31.6  26.0 - 34.0 pg Final  . MCHC 06/03/2020 34.1  30.0 - 36.0 g/dL Final  . RDW 16/10/960406/18/2021 12.4  11.5 - 15.5 % Final  . Platelets 06/03/2020 268  150 - 400 K/uL Final  . nRBC 06/03/2020 0.0  0.0 - 0.2 % Final  . Neutrophils Relative % 06/03/2020 52  % Final  . Neutro Abs 06/03/2020 3.6  1.7 - 7.7 K/uL Final  . Lymphocytes Relative 06/03/2020 41  % Final  . Lymphs Abs 06/03/2020 2.8  0.7 - 4.0 K/uL Final  . Monocytes Relative 06/03/2020 6  % Final  . Monocytes Absolute 06/03/2020 0.4  0 - 1 K/uL Final  . Eosinophils Relative 06/03/2020 1  % Final  . Eosinophils Absolute 06/03/2020 0.1  0 - 0 K/uL Final  . Basophils Relative 06/03/2020 0  % Final  . Basophils Absolute 06/03/2020 0.0  0 - 0 K/uL Final  . Immature Granulocytes 06/03/2020 0  % Final  . Abs Immature  Granulocytes 06/03/2020 0.01  0.00 - 0.07 K/uL Final   Performed at [facility], [address] [place], [place] [id number]    Assessment:  Zachery DakinsShannon D Papania is a 44 y.o. female with a history of gastric bypass surgery (2012) with an elevated serum copper level.  Work-up on 06/03/2020 revealed a hematocrit was 36.7, hemoglobin 12.5, platelets 268,000, WBC 7000. Ferritin was 19 with an iron saturation of 38% (high) and a TIBC of 364. Cortisol was 6.0 (low). Zinc was 91. Folate was 9.7. Vitamin B12 was 339 (low normal). Copper was 166 (80-158).  Hemochromatosis assay revealed no mutation (C282Y, H63D, S65C).  Copper followed: 237 on 11/18/2019, 176 on 12/30/2019, 177 on 04/27/2020 and 166 on 06/03/2020.  Ferritin followed: 21 on 03/05/2014, 13 on 05/12/2014, 5 on 09/02/2019, 8 on 11/03/2019, 18 on 11/18/2019.  Symptomatically, ***  Plan: 1.   Labs ***  "Copper toxicity from taking in too much copper, perhaps through water or dietary supplements Anemia Biliary cirrhosis, a liver disease Hemochromatosis, a condition in which your body absorbs too much iron Overactive thyroid (hyperthyroidism) Underactive thyroid (hypothyroidism) Infection Leukemia Lymphoma Rheumatoid arthritis"  I discussed the assessment and treatment plan with the patient.  The patient was provided an opportunity to ask questions and all were answered.  The patient agreed with the plan and demonstrated an understanding of the instructions.  The patient was advised to call back if the symptoms worsen or if the condition fails to improve as anticipated.  I provided *** minutes of face-to-face time during this this encounter and > 50% was spent counseling as documented under my assessment and plan.    Rechelle Niebla C. Merlene Pullingorcoran, MD, PhD    06/20/2020, 10:58 PM  I, Rosey BathMelissa C Johann Gascoigne, am acting as Neurosurgeonscribe for General MotorsMelissa C. Merlene Pullingorcoran, MD, PhD.  {Add scribe attestation statement}

## 2020-06-24 ENCOUNTER — Encounter: Payer: Self-pay | Admitting: Hematology and Oncology

## 2020-06-24 ENCOUNTER — Telehealth: Payer: Self-pay

## 2020-06-24 ENCOUNTER — Inpatient Hospital Stay: Payer: Medicare Other | Attending: Hematology and Oncology | Admitting: Hematology and Oncology

## 2020-06-24 ENCOUNTER — Other Ambulatory Visit: Payer: Self-pay

## 2020-06-24 VITALS — BP 117/71 | HR 63 | Temp 96.3°F | Wt 254.2 lb

## 2020-06-24 DIAGNOSIS — Z7951 Long term (current) use of inhaled steroids: Secondary | ICD-10-CM | POA: Insufficient documentation

## 2020-06-24 DIAGNOSIS — E538 Deficiency of other specified B group vitamins: Secondary | ICD-10-CM | POA: Diagnosis not present

## 2020-06-24 DIAGNOSIS — Z87891 Personal history of nicotine dependence: Secondary | ICD-10-CM | POA: Diagnosis not present

## 2020-06-24 DIAGNOSIS — J45909 Unspecified asthma, uncomplicated: Secondary | ICD-10-CM | POA: Diagnosis not present

## 2020-06-24 DIAGNOSIS — R790 Abnormal level of blood mineral: Secondary | ICD-10-CM

## 2020-06-24 DIAGNOSIS — R410 Disorientation, unspecified: Secondary | ICD-10-CM | POA: Insufficient documentation

## 2020-06-24 DIAGNOSIS — R110 Nausea: Secondary | ICD-10-CM | POA: Insufficient documentation

## 2020-06-24 DIAGNOSIS — R232 Flushing: Secondary | ICD-10-CM | POA: Insufficient documentation

## 2020-06-24 DIAGNOSIS — R7989 Other specified abnormal findings of blood chemistry: Secondary | ICD-10-CM

## 2020-06-24 DIAGNOSIS — Z90722 Acquired absence of ovaries, bilateral: Secondary | ICD-10-CM | POA: Diagnosis not present

## 2020-06-24 DIAGNOSIS — R5383 Other fatigue: Secondary | ICD-10-CM | POA: Diagnosis not present

## 2020-06-24 DIAGNOSIS — Z9884 Bariatric surgery status: Secondary | ICD-10-CM | POA: Insufficient documentation

## 2020-06-24 DIAGNOSIS — E2740 Unspecified adrenocortical insufficiency: Secondary | ICD-10-CM | POA: Insufficient documentation

## 2020-06-24 DIAGNOSIS — G8929 Other chronic pain: Secondary | ICD-10-CM | POA: Diagnosis not present

## 2020-06-24 DIAGNOSIS — Z79899 Other long term (current) drug therapy: Secondary | ICD-10-CM | POA: Insufficient documentation

## 2020-06-24 DIAGNOSIS — Z90710 Acquired absence of both cervix and uterus: Secondary | ICD-10-CM | POA: Diagnosis not present

## 2020-06-24 DIAGNOSIS — R011 Cardiac murmur, unspecified: Secondary | ICD-10-CM | POA: Diagnosis not present

## 2020-06-24 DIAGNOSIS — R42 Dizziness and giddiness: Secondary | ICD-10-CM | POA: Diagnosis not present

## 2020-06-24 DIAGNOSIS — F329 Major depressive disorder, single episode, unspecified: Secondary | ICD-10-CM | POA: Diagnosis not present

## 2020-06-24 DIAGNOSIS — F319 Bipolar disorder, unspecified: Secondary | ICD-10-CM | POA: Insufficient documentation

## 2020-06-24 NOTE — Telephone Encounter (Signed)
Referral new patient information to Dr Tedd Sias ffice. Fax conformation confirmed.

## 2020-06-24 NOTE — Progress Notes (Signed)
No new changes noted today 

## 2020-06-24 NOTE — Progress Notes (Signed)
[facility]  [address], [address] [place], [place] [address] Phone: [phone]  Fax: [phone]   Clinic Day:  06/24/2020  Referring physician: Rolm Gala, MD  Chief Complaint: [name] is a 44 y.o. female with an abnormal copper level who is seen for review of work-up and discussion regarding direction of therapy.  HPI: The patient was last seen at the hematology clinic on 06/03/2020 for a new patient assessment. At that time, she was seen for an elevated copper level that had improved slightly after discontinuation of MVI with copper.  She was noted to have an abnormal iron saturation.   Work-up revealed a hematocrit was 36.7, hemoglobin 12.5, platelets 268,000, WBC 7000. Ferritin was 19 with an iron saturation of 38% (high) and a TIBC of 364. Cortisol was 6.0 (low). Zinc was 91 (44-115). Folate was 9.7. Vitamin B12 was 339 (low normal). Copper was 166 (80-158).  Hemochromatosis assay revealed no mutation (C282Y, H63D, S65C).  During the interim, she has been "ok".  She has been getting disoriented and dizzy every so often.  Dr. Greggory Stallion is working her up for this. She also gets nauseous every time she eats. She has been fatigued for the past year.  The patient is getting her eyes checked later this month.   Past Medical History:  Diagnosis Date  . Asthma   . Bipolar 1 disorder (HCC)   . Bipolar disorder (HCC)   . Chronic pain   . Depression   . Endometriosis   . Fluid retention   . Gestational diabetes   . Heart murmur   . Hot flashes   . Panic disorder   . Tremor    head    Past Surgical History:  Procedure Laterality Date  . ABDOMINAL HYSTERECTOMY    . CESAREAN SECTION     x2  . CHOLECYSTECTOMY    . GASTRIC BYPASS      Family History  Problem Relation Age of Onset  . Arthritis Mother   . COPD Mother   . Depression Mother   . Diabetes Mother   . Hypertension Mother   . Depression Father   . Diabetes Father   . Heart  disease Father   . Hypertension Father     Social History:  reports that she quit smoking about 2 years ago. Her smoking use included cigarettes. She has never used smokeless tobacco. She reports previous alcohol use. She reports that she does not use drugs. She has a 25 year long smoking history. She used to smoke half a pack to 1 pack of cigarettes per day. She drinks alcohol intermittently. Her maternal aunt had cervical cancer. Another maternal aunt had metastatic cancer, unknown. Both maternal and paternal grandfathers had cancer, unknown. Her maternal grandmother had cancer, unknown. There is no known family history of any blood disorders. She denies any exposure to raditions or toxins. 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 She is currently not working. The patient is alone today.  Allergies:  Allergies  Allergen Reactions  . Latex Nausea And Vomiting  . Nsaids Other (See Comments)    Had weight loss surgery not suppose to take nsaids    Current Medications: Current Outpatient Medications  Medication Sig Dispense Refill  . albuterol (VENTOLIN HFA) 108 (90 Base) MCG/ACT inhaler Inhale into the lungs.    Marland Kitchen amantadine (SYMMETREL) 100 MG capsule Take 100 mg by mouth 2 (two) times daily.    Marland Kitchen amphetamine-dextroamphetamine (  ADDERALL) 20 MG tablet Take 20 mg by mouth 2 (two) times daily.    Marland Kitchen b complex vitamins tablet Take 1 tablet by mouth daily.    . budesonide-formoterol (SYMBICORT) 160-4.5 MCG/ACT inhaler Inhale 2 puffs into the lungs in the morning and at bedtime.    . busPIRone (BUSPAR) 15 MG tablet Take 15 mg by mouth 2 (two) times daily.    . carbamazepine (TEGRETOL) 200 MG tablet Take 200 mg by mouth 4 (four) times daily. Reported on 06/13/2016    . cyanocobalamin (,VITAMIN B-12,) 1000 MCG/ML injection Inject into the muscle.    . diclofenac Sodium (VOLTAREN) 1 % GEL APPLY 2 4 G TO PAINFUL AREA OF SKIN 4 TIMES PER DAY.    . furosemide (LASIX) 20 MG  tablet Take 20 mg by mouth daily.    Marland Kitchen gabapentin (NEURONTIN) 100 MG capsule Takes 2-3 capsules, 2-4 times daily.     Marland Kitchen HYDROcodone-acetaminophen (HYCET) 7.5-325 mg/15 ml solution SMARTSIG:1-2 Tablespoon By Mouth 4 Times Daily PRN    . hydrOXYzine (ATARAX/VISTARIL) 25 MG tablet Take 25 mg by mouth 3 (three) times daily.    . Ipratropium-Albuterol (COMBIVENT) 20-100 MCG/ACT AERS respimat Inhale into the lungs.    . lamoTRIgine (LAMICTAL) 200 MG tablet Take 400 mg by mouth at bedtime.     . Lurasidone HCl 120 MG TABS Take 120 mg by mouth daily.    . meclizine (ANTIVERT) 25 MG tablet TAKE 1 TABLET BY MOUTH THREE TIMES DAILY AS NEEDED FOR DIZZINESS FOR UP TO 10 DAYS    . ondansetron (ZOFRAN) 8 MG tablet Take 8 mg by mouth every 8 (eight) hours as needed for nausea or vomiting.    . pantoprazole (PROTONIX) 40 MG tablet Take 1 tablet by mouth daily.    Marland Kitchen scopolamine (TRANSDERM-SCOP) 1 MG/3DAYS 1 patch every 3 (three) days.    . SUMAtriptan (IMITREX) 50 MG tablet Take 50 mg by mouth every 2 (two) hours as needed for migraine. May repeat in 2 hours if headache persists or recurs.    Marland Kitchen alprazolam (XANAX) 2 MG tablet Take 2 mg by mouth daily.  (Patient not taking: Reported on 06/24/2020)    . HYDROcodone-acetaminophen (NORCO) 7.5-325 MG tablet Take 1-2 tablets by mouth 4 (four) times daily. (Patient not taking: Reported on 06/24/2020)    . ondansetron (ZOFRAN-ODT) 8 MG disintegrating tablet DISSOLVE 1 TABLET IN MOUTH EVERY 8 HOURS AS NEEDED FOR NAUSEA     No current facility-administered medications for this visit.    Review of Systems  Constitutional: Positive for malaise/fatigue (constant, x1 year) and weight loss (10 lbs). Negative for chills, diaphoresis and fever.       Feels "ok".  HENT: Negative.  Negative for congestion, ear discharge, ear pain, hearing loss, nosebleeds, sinus pain, sore throat and tinnitus.   Respiratory: Negative.  Negative for cough, hemoptysis, sputum production and shortness of  breath.   Cardiovascular: Negative.  Negative for chest pain, palpitations and leg swelling.  Gastrointestinal: Positive for nausea (after eating). Negative for abdominal pain, blood in stool, constipation, diarrhea, heartburn, melena and vomiting.       Gastric bypass (2012).  Genitourinary: Negative.  Negative for dysuria, frequency, hematuria and urgency.       Hysterectomy 2006. No menstrual cycles.  Musculoskeletal: Negative.  Negative for back pain, joint pain, myalgias and neck pain.  Skin: Negative.  Negative for itching and rash.  Neurological: Positive for dizziness and sensory change (on gabapentin, anterior bilateral leg numbness). Negative for  tingling, weakness and headaches.       Gets disoriented occasionally  Endo/Heme/Allergies: Negative.  Does not bruise/bleed easily.  Psychiatric/Behavioral: Positive for depression (bipolar deperssion) and memory loss. The patient is not nervous/anxious and does not have insomnia.   All other systems reviewed and are negative.  Performance status (ECOG): 1-2  Vitals Blood pressure 117/71, pulse 63, temperature (!) 96.3 F (35.7 C), temperature source Tympanic, weight 254 lb 3.1 oz (115.3 kg), SpO2 100 %.   Physical Exam Vitals and nursing note reviewed.  Constitutional:      General: She is not in acute distress.    Appearance: Normal appearance.     Interventions: Face mask in place.  HENT:     Head:     Comments: Long black hair pulled back. Eyes:     General: No scleral icterus.    Conjunctiva/sclera: Conjunctivae normal.     Comments: Black rimmed glasses.  Neurological:     Mental Status: She is alert and oriented to person, place, and time.  Psychiatric:        Behavior: Behavior normal.        Thought Content: Thought content normal.        Judgment: Judgment normal.    No visits with results within 3 Day(s) from this visit.  Latest known visit with results is:  Appointment on 06/03/2020  Component Date Value Ref  Range Status  . Cortisol, Plasma 06/03/2020 6.0  ug/dL Final   Comment: (NOTE) AM    6.7 - 22.6 ug/dL PM   <29.4       ug/dL Performed at [facility], [address], [place], [place] [address]   . Zinc 06/03/2020 91  44 - 115 ug/dL Final   Comment: (NOTE) This test was developed and its performance characteristics determined by [facility]. It has not been cleared or approved by the Food and Drug Administration.                                Detection Limit = 5 Performed At: [facility] [address] [place], [place] [id number] Jolene Schimke MD [id number]   . Copper 06/03/2020 166* 80 - 158 ug/dL Final   Comment: (NOTE) This test was developed and its performance characteristics determined by [facility]. It has not been cleared or approved by the Food and Drug Administration.                                Detection Limit = 5 Performed At: [facility] [address] [place], [place] [address] Jolene Schimke MD [id number]   . DNA Mutation Analysis 06/03/2020 Comment   Final   Comment: (NOTE) NO MUTATION IDENTIFIED Interpretation: This patient's sample was analyzed for the hereditary hemochromatosis (HH) mutations C282Y, H63D, S65C. No mutation was identified. The mutations analyzed by [facility] are most common in the Caucasian population, and up to 90% of affected Caucasians will have a positive test result. Because this panel does not identify rare HH mutations or HH mutations found in other ethnic groups, there are a small number of people who may have a negative test but may actually be affected. The diagnosis of HH should include clinical findings and other test results such as transferrin-iron saturation and/or serum ferritin studies and/or liver biopsy.  If this patient has a history of HH, in many cases a  specific carrier risk can be determined based on this negative result. Methodology: DNA Analysis of the HFE gene was performed by  PCR amplification followed by restriction enzyme digestion analyses. Reference: Justice Rocherooley JS and Walker AP. (20                          00). Genet Test 4:97-101. Cogswell ME et al. (1999). AM J Prev Med 16:134-140. Bomford A (2002). Lancet 360(9346):1673-81. Dorina HoyerJill Waalen et al. (2002). Blood Cells, Molecules. and Diseases.  29(3):418-432. Imperatore G et al. (2003). Genet Med. 5(1):1-8. Cogswell ME et al. (2003). Genet Med. 5(4):304-10. This test was developed and its performance characteristics determined by [facility]. It has not been cleared or approved by the Food and Drug Administration. Genetic counselors are available for health care providers to discuss results at 1-800-345-GENE. Vincenza Hewshevonne Eversley, PhD, [facility] Ernestene MentionMelissa A Hayden, PhD, [facility] Gillian ShieldsW. Christine Spence, PhD, [facility] Manya SilvasAlecia Willis, PhD, [facility] Bonnielee HaffHongli Zhan, PhD, [facility] Alpha GulaJoseph B Kearney, PhD, [facility] Performed At: [facility] [address] [place], [place] [id number] Maurine Simmeringhenn Anjen MDPhD [phone]   . Folate 06/03/2020 9.7  >5.9 ng/mL Final   Performed at [facility], [address] [place], [place] [address]  . Vitamin B-12 06/03/2020 339  180 - 914 pg/mL Final   Comment: (NOTE) This assay is not validated for testing neonatal or myeloproliferative syndrome specimens for Vitamin B12 levels. Performed at [facility], [address] [place], [place] [address]   . Iron 06/03/2020 137  28 - 170 ug/dL Final  . TIBC 78/46/962906/18/2021 364  250 - 450 ug/dL Final  . Saturation Ratios 06/03/2020 38* 10.4 - 31.8 % Final  . UIBC 06/03/2020 227  ug/dL Final   Performed at [facility], [address] [place], [place] [address]  . Ferritin 06/03/2020 19  11 - 307 ng/mL Final   Performed at [facility], [address] [place], [place] [address]  . WBC 06/03/2020 7.0  4.0 - 10.5 K/uL Final  . RBC 06/03/2020 3.96  3.87 - 5.11 MIL/uL Final  . Hemoglobin 06/03/2020 12.5  12.0 - 15.0 g/dL Final  . HCT  10/27/253606/18/2021 36.7  36 - 46 % Final  . MCV 06/03/2020 92.7  80.0 - 100.0 fL Final  . MCH 06/03/2020 31.6  26.0 - 34.0 pg Final  . MCHC 06/03/2020 34.1  30.0 - 36.0 g/dL Final  . RDW 64/40/347406/18/2021 12.4  11.5 - 15.5 % Final  . Platelets 06/03/2020 268  150 - 400 K/uL Final  . nRBC 06/03/2020 0.0  0.0 - 0.2 % Final  . Neutrophils Relative % 06/03/2020 52  % Final  . Neutro Abs 06/03/2020 3.6  1.7 - 7.7 K/uL Final  . Lymphocytes Relative 06/03/2020 41  % Final  . Lymphs Abs 06/03/2020 2.8  0.7 - 4.0 K/uL Final  . Monocytes Relative 06/03/2020 6  % Final  . Monocytes Absolute 06/03/2020 0.4  0 - 1 K/uL Final  . Eosinophils Relative 06/03/2020 1  % Final  . Eosinophils Absolute 06/03/2020 0.1  0 - 0 K/uL Final  . Basophils Relative 06/03/2020 0  % Final  . Basophils Absolute 06/03/2020 0.0  0 - 0 K/uL Final  . Immature Granulocytes 06/03/2020 0  % Final  . Abs Immature Granulocytes 06/03/2020 0.01  0.00 - 0.07 K/uL Final   Performed at [facility], [address] [place], [place] [address]    Assessment:  [name] CarinaShannon D  [name] is a 44 y.o. female with a history of gastric bypass surgery (2012) with an elevated serum copper level.  Work-up on 06/03/2020 revealed a hematocrit was 36.7, hemoglobin 12.5, platelets 268,000, WBC 7000. Ferritin was 19 with an iron saturation of 38% (high) and a TIBC of 364. Cortisol was 6.0 (low). Zinc was 91. Folate was 9.7. Vitamin B12 was 339 (low normal). Copper was 166 (80-158).  Hemochromatosis assay revealed no mutation (C282Y, H63D, S65C).  Copper followed (80-158): 237 on 11/18/2019, 176 on 12/30/2019, 177 on 04/27/2020 and 166 on 06/03/2020.  Abdominal ultrasound on 05/13/2020 revealed a mildy echogenic liver most commonly seen with steatosis.  Steatosis can be seen with copper deposition.  There were no findings of cirrhosis.  She has B12 deficiency. B12 was 184 (low).  She began B12 injections (05/24/2020)  She has iron deficiency.   Ferritin has  been followed: 21 on 03/05/2014, 13 on 05/12/2014, 5 on 09/02/2019, 8 on 11/03/2019, 18 on 11/18/2019.  She received the Pfizer COVID-19 vaccine on 03/14/2020 and 04/06/2020.   Symptomatically, she feels fatigued.  She has a decreased appetite and nausea.  She has lost 40 pound in the past 6-12 months. Exam is unremarkable.  Plan: 1.   Review labs from 06/03/2020. 2.   Elevated serum copper             Etiology unclear              Differential includes supplements, liver/biliary disease, thyroid dysfunction, infection, leukemia/lymphom, RA.             Copper level declining off supplementation.  Patient is scheduled to see ophthalmology to assess for [facility] rings.             Evaluation revealed a low cortisol level.. 3.   H/o gastric bypass surgery             B12 level was low.   Patient on B12 injections.  Ferritin was 19 with an iron saturation of 38% (high) and a TIBC of 364.   Anticipate future need for IV iron.  Continue to monitor. 4.   Low cortisol level  AM cortisol was 6.0 (low).  Adrenal insufficiency may be the etiology of the patient's symptoms.  Discuss formal endocrine testing (Cortrosyn stimulation testing).  Dr Gavin Potters or Dr Greggory Stallion on the phone today. 5.   Consult endocrinology- [facility]. 6.   RTC prn.  I discussed the assessment and treatment plan with the patient.  The patient was provided an opportunity to ask questions and all were answered.  The patient agreed with the plan and demonstrated an understanding of the instructions.  The patient was advised to call back if the symptoms worsen or if the condition fails to improve as anticipated.  I provided 20 minutes of face-to-face time during this this encounter and > 50% was spent counseling as documented under my assessment and plan.    Atanacio Melnyk C. Merlene Pulling, MD, PhD    06/24/2020, 12:10 PM  I, Danella Penton Tufford, am acting as Neurosurgeon for [employer]. Merlene Pulling, MD, PhD.  I, Tehilla Coffel C. Merlene Pulling, MD, have  reviewed the above documentation for accuracy and completeness, and I agree with the above.

## 2020-07-04 ENCOUNTER — Ambulatory Visit: Payer: Medicare Other | Admitting: Hematology and Oncology

## 2020-09-20 ENCOUNTER — Other Ambulatory Visit: Payer: Self-pay | Admitting: Family Medicine

## 2020-09-20 DIAGNOSIS — Z1231 Encounter for screening mammogram for malignant neoplasm of breast: Secondary | ICD-10-CM

## 2020-09-26 ENCOUNTER — Inpatient Hospital Stay: Admission: RE | Admit: 2020-09-26 | Payer: Medicare Other | Source: Ambulatory Visit

## 2020-10-06 ENCOUNTER — Other Ambulatory Visit: Payer: Self-pay

## 2020-10-06 ENCOUNTER — Ambulatory Visit
Admission: RE | Admit: 2020-10-06 | Discharge: 2020-10-06 | Disposition: A | Discharge status: Home / Self Care | Payer: Medicare Other | Source: Ambulatory Visit | Attending: Family Medicine | Admitting: Family Medicine

## 2020-10-06 DIAGNOSIS — Z1231 Encounter for screening mammogram for malignant neoplasm of breast: Secondary | ICD-10-CM | POA: Diagnosis present

## 2021-08-07 ENCOUNTER — Other Ambulatory Visit: Payer: Self-pay | Admitting: Gastroenterology

## 2021-08-07 DIAGNOSIS — K760 Fatty (change of) liver, not elsewhere classified: Secondary | ICD-10-CM

## 2021-08-18 ENCOUNTER — Ambulatory Visit: Payer: Medicare Other

## 2021-12-29 ENCOUNTER — Encounter: Payer: Self-pay | Admitting: *Deleted

## 2022-01-01 ENCOUNTER — Encounter: Payer: Self-pay | Admitting: *Deleted

## 2022-01-01 ENCOUNTER — Encounter
Admission: RE | Disposition: A | Discharge status: Home / Self Care | Payer: Self-pay | Source: Home / Self Care | Attending: Gastroenterology

## 2022-01-01 ENCOUNTER — Ambulatory Visit
Admission: RE | Admit: 2022-01-01 | Discharge: 2022-01-01 | Disposition: A | Discharge status: Home / Self Care | Payer: Medicare Other | Attending: Gastroenterology | Admitting: Gastroenterology

## 2022-01-01 ENCOUNTER — Ambulatory Visit: Payer: Medicare Other | Admitting: Certified Registered Nurse Anesthetist

## 2022-01-01 DIAGNOSIS — Z9884 Bariatric surgery status: Secondary | ICD-10-CM | POA: Diagnosis not present

## 2022-01-01 DIAGNOSIS — K5730 Diverticulosis of large intestine without perforation or abscess without bleeding: Secondary | ICD-10-CM | POA: Diagnosis not present

## 2022-01-01 DIAGNOSIS — Z1211 Encounter for screening for malignant neoplasm of colon: Secondary | ICD-10-CM | POA: Diagnosis not present

## 2022-01-01 DIAGNOSIS — R1013 Epigastric pain: Secondary | ICD-10-CM | POA: Insufficient documentation

## 2022-01-01 DIAGNOSIS — Z980 Intestinal bypass and anastomosis status: Secondary | ICD-10-CM | POA: Diagnosis not present

## 2022-01-01 DIAGNOSIS — K640 First degree hemorrhoids: Secondary | ICD-10-CM | POA: Diagnosis not present

## 2022-01-01 HISTORY — DX: Unspecified ovarian cyst, unspecified side: N83.209

## 2022-01-01 HISTORY — DX: Age-related osteoporosis without current pathological fracture: M81.0

## 2022-01-01 HISTORY — PX: COLONOSCOPY WITH PROPOFOL: SHX5780

## 2022-01-01 HISTORY — PX: ESOPHAGOGASTRODUODENOSCOPY (EGD) WITH PROPOFOL: SHX5813

## 2022-01-01 HISTORY — DX: Personal history of other diseases of the female genital tract: Z87.42

## 2022-01-01 HISTORY — DX: Tobacco use: Z72.0

## 2022-01-01 HISTORY — DX: Essential (primary) hypertension: I10

## 2022-01-01 HISTORY — DX: Spondylosis without myelopathy or radiculopathy, lumbar region: M47.816

## 2022-01-01 HISTORY — DX: Anemia, unspecified: D64.9

## 2022-01-01 HISTORY — DX: Sleep apnea, unspecified: G47.30

## 2022-01-01 HISTORY — DX: Unspecified osteoarthritis, unspecified site: M19.90

## 2022-01-01 HISTORY — DX: Morbid (severe) obesity due to excess calories: E66.01

## 2022-01-01 HISTORY — DX: Hyperlipidemia, unspecified: E78.5

## 2022-01-01 HISTORY — DX: Gastro-esophageal reflux disease without esophagitis: K21.9

## 2022-01-01 SURGERY — ESOPHAGOGASTRODUODENOSCOPY (EGD) WITH PROPOFOL
Anesthesia: General

## 2022-01-01 MED ORDER — PROPOFOL 10 MG/ML IV BOLUS
INTRAVENOUS | Status: DC | PRN
  Administered 2022-01-01: 60 mg via INTRAVENOUS

## 2022-01-01 MED ORDER — LIDOCAINE HCL (CARDIAC) PF 100 MG/5ML IV SOSY
PREFILLED_SYRINGE | INTRAVENOUS | Status: DC | PRN
  Administered 2022-01-01: 50 mg via INTRAVENOUS

## 2022-01-01 MED ORDER — PROPOFOL 500 MG/50ML IV EMUL
INTRAVENOUS | Status: DC | PRN
  Administered 2022-01-01: 125 ug/kg/min via INTRAVENOUS

## 2022-01-01 MED ORDER — SODIUM CHLORIDE 0.9 % IV SOLN: INTRAVENOUS | Status: DC

## 2022-01-01 MED ORDER — PHENYLEPHRINE 40 MCG/ML (10ML) SYRINGE FOR IV PUSH (FOR BLOOD PRESSURE SUPPORT)
PREFILLED_SYRINGE | INTRAVENOUS | Status: DC | PRN
Start: 2022-01-01 — End: 2022-01-01
  Administered 2022-01-01: 40 ug via INTRAVENOUS

## 2022-01-01 MED ORDER — GLYCOPYRROLATE 0.2 MG/ML IJ SOLN
INTRAMUSCULAR | Status: DC | PRN
  Administered 2022-01-01: .2 mg via INTRAVENOUS

## 2022-01-01 NOTE — H&P (Signed)
Outpatient short stay form Pre-procedure 01/01/2022  [name] Rubenstein, MD  Primary Physician: Sharyne Peach, MD  Reason for visit:  Dyspepsia/Screening colonoscopy  History of present illness:    46 y/o lady with history of multiple medical problems including bipolar, obesity s/p Roux-en-y, and GERD here for EGD/Colonoscopy. Several abdominal surgeries besides gastric bypass including hysterectomy and cholecystectomy. Had normal colonoscopy in 2015. No blood thinners.    Current Facility-Administered Medications:    0.9 %  sodium chloride infusion, , Intravenous, Continuous, Korde Jeppsen, Hilton Cork, MD, Last Rate: 20 mL/hr at 01/01/22 0900, New Bag at 01/01/22 0900  Medications Prior to Admission  Medication Sig Dispense Refill Last Dose   budesonide-formoterol (SYMBICORT) 160-4.5 MCG/ACT inhaler Inhale 2 puffs into the lungs in the morning and at bedtime.   01/01/2022   carbamazepine (TEGRETOL) 200 MG tablet Take 200 mg by mouth 4 (four) times daily. Reported on 06/13/2016   12/31/2021   Cyanocobalamin (VITAMIN B-12 IJ) Inject as directed every 14 (fourteen) days.      ergocalciferol (VITAMIN D2) 1.25 MG (50000 UT) capsule Take 50,000 Units by mouth once a week.      gabapentin (NEURONTIN) 100 MG capsule Takes 2-3 capsules, 2-4 times daily.    12/31/2021   hydrOXYzine (ATARAX/VISTARIL) 25 MG tablet Take 25 mg by mouth 3 (three) times daily.   12/31/2021   lamoTRIgine (LAMICTAL) 200 MG tablet Take 400 mg by mouth at bedtime.    12/31/2021   pantoprazole (PROTONIX) 40 MG tablet Take 1 tablet by mouth daily.   12/31/2021   tiZANidine (ZANAFLEX) 2 MG tablet Take by mouth every 6 (six) hours as needed for muscle spasms.   12/31/2021   albuterol (VENTOLIN HFA) 108 (90 Base) MCG/ACT inhaler Inhale into the lungs.      alprazolam (XANAX) 2 MG tablet Take 2 mg by mouth daily.  (Patient not taking: Reported on 06/24/2020)      amantadine (SYMMETREL) 100 MG capsule Take 100 mg by mouth 2 (two) times daily.       amphetamine-dextroamphetamine (ADDERALL) 20 MG tablet Take 20 mg by mouth 2 (two) times daily.      b complex vitamins tablet Take 1 tablet by mouth daily.      busPIRone (BUSPAR) 15 MG tablet Take 15 mg by mouth 2 (two) times daily. (Patient not taking: Reported on 01/01/2022)   Not Taking   diclofenac Sodium (VOLTAREN) 1 % GEL APPLY 2 4 G TO PAINFUL AREA OF SKIN 4 TIMES PER DAY.      furosemide (LASIX) 20 MG tablet Take 20 mg by mouth daily.      HYDROcodone-acetaminophen (HYCET) 7.5-325 mg/15 ml solution SMARTSIG:1-2 Tablespoon By Mouth 4 Times Daily PRN      HYDROcodone-acetaminophen (NORCO) 7.5-325 MG tablet Take 1-2 tablets by mouth 4 (four) times daily. (Patient not taking: Reported on 06/24/2020)      Ipratropium-Albuterol (COMBIVENT) 20-100 MCG/ACT AERS respimat Inhale into the lungs.      LITHIUM CARBONATE PO Take 450 mg by mouth. (Patient not taking: Reported on 01/01/2022)   Not Taking   Lurasidone HCl 120 MG TABS Take 120 mg by mouth daily.      meclizine (ANTIVERT) 25 MG tablet TAKE 1 TABLET BY MOUTH THREE TIMES DAILY AS NEEDED FOR DIZZINESS FOR UP TO 10 DAYS      ondansetron (ZOFRAN) 8 MG tablet Take 8 mg by mouth every 8 (eight) hours as needed for nausea or vomiting.      ondansetron (ZOFRAN-ODT)  8 MG disintegrating tablet DISSOLVE 1 TABLET IN MOUTH EVERY 8 HOURS AS NEEDED FOR NAUSEA      scopolamine (TRANSDERM-SCOP) 1 MG/3DAYS 1 patch every 3 (three) days.      SUMAtriptan (IMITREX) 50 MG tablet Take 50 mg by mouth every 2 (two) hours as needed for migraine. May repeat in 2 hours if headache persists or recurs.        Allergies  Allergen Reactions   Latex Nausea And Vomiting   Nsaids Other (See Comments)    Had weight loss surgery not suppose to take nsaids     Past Medical History:  Diagnosis Date   Anemia    Arthritis    Asthma    Bipolar 1 disorder ([place])    Bipolar disorder (HCC)    Chronic pain    Chronic pain    Depression    Endometriosis    Fluid  retention    GERD (gastroesophageal reflux disease)    Gestational diabetes    Heart murmur    History of abnormal cervical Pap smear    Hot flashes    Hyperlipidemia    Hypertension    Lumbar facet arthropathy    Morbid obesity ([place])    Osteoporosis    Ovarian cyst    Panic disorder    Sleep apnea    Tobacco abuse    Tremor    head    Review of systems:  Otherwise negative.    Physical Exam  Gen: Alert, oriented. Appears stated age.  HEENT: PERRLA. Lungs: No respiratory distress CV: RRR Abd: soft, benign, no masses Ext: No edema    Planned procedures: Proceed with EGD/colonoscopy. The patient understands the nature of the planned procedure, indications, risks, alternatives and potential complications including but not limited to bleeding, infection, perforation, damage to internal organs and possible oversedation/side effects from anesthesia. The patient agrees and gives consent to proceed.  Please refer to procedure notes for findings, recommendations and patient disposition/instructions.     [name] Rubenstein, MD [facility]

## 2022-01-01 NOTE — Anesthesia Preprocedure Evaluation (Signed)
Anesthesia Evaluation  Patient identified by MRN, date of birth, ID band Patient awake    Reviewed: Allergy & Precautions, H&P , NPO status , Patient's Chart, lab work & pertinent test results, reviewed documented beta blocker date and time   History of Anesthesia Complications Negative for: history of anesthetic complications  Airway Mallampati: I  TM Distance: >3 FB Neck ROM: full    Dental  (+) Dental Advidsory Given, Missing, Teeth Intact   Pulmonary neg shortness of breath, asthma , sleep apnea , neg COPD, neg recent URI, former smoker,    Pulmonary exam normal breath sounds clear to auscultation       Cardiovascular Exercise Tolerance: Good hypertension, (-) angina(-) Past MI and (-) Cardiac Stents Normal cardiovascular exam(-) dysrhythmias + Valvular Problems/Murmurs  Rhythm:regular Rate:Normal     Neuro/Psych PSYCHIATRIC DISORDERS Anxiety Depression Bipolar Disorder negative neurological ROS     GI/Hepatic Neg liver ROS, GERD  ,  Endo/Other  diabetes (history of, none since weight loss surgery), Well ControlledMorbid obesity  Renal/GU negative Renal ROS  negative genitourinary   Musculoskeletal   Abdominal   Peds  Hematology negative hematology ROS (+)   Anesthesia Other Findings Past Medical History: No date: Anemia No date: Arthritis No date: Asthma No date: Bipolar 1 disorder (HCC) No date: Bipolar disorder (HCC) No date: Chronic pain No date: Chronic pain No date: Depression No date: Endometriosis No date: Fluid retention No date: GERD (gastroesophageal reflux disease) No date: Gestational diabetes No date: Heart murmur No date: History of abnormal cervical Pap smear No date: Hot flashes No date: Hyperlipidemia No date: Hypertension No date: Lumbar facet arthropathy No date: Morbid obesity (HCC) No date: Osteoporosis No date: Ovarian cyst No date: Panic disorder No date: Sleep apnea No  date: Tobacco abuse No date: Tremor     Comment:  head   Reproductive/Obstetrics negative OB ROS                             Anesthesia Physical Anesthesia Plan  ASA: 3  Anesthesia Plan: General   Post-op Pain Management:    Induction: Intravenous  PONV Risk Score and Plan: 3 and Propofol infusion and TIVA  Airway Management Planned: Natural Airway and Nasal Cannula  Additional Equipment:   Intra-op Plan:   Post-operative Plan:   Informed Consent: I have reviewed the patients History and Physical, chart, labs and discussed the procedure including the risks, benefits and alternatives for the proposed anesthesia with the patient or authorized representative who has indicated his/her understanding and acceptance.     Dental Advisory Given  Plan Discussed with: Anesthesiologist, CRNA and Surgeon  Anesthesia Plan Comments:         Anesthesia Quick Evaluation

## 2022-01-01 NOTE — Op Note (Signed)
[facility] Gastroenterology Patient Name: [name] Procedure Date: 01/01/2022 9:16 AM MRN: [id number] Account #: [id number] Date of Birth: 1976/03/12 Admit Type: Outpatient Age: 46 Room: [facility] ENDO ROOM 3 Gender: Female Note Status: Finalized Instrument Name: Jasper Riling [id number] Procedure:             Colonoscopy Indications:           Screening for colorectal malignant neoplasm Providers:             Andrey Farmer MD, MD Medicines:             Monitored Anesthesia Care Complications:         No immediate complications. Procedure:             Pre-Anesthesia Assessment:                        - Prior to the procedure, a History and Physical was                         performed, and patient medications and allergies were                         reviewed. The patient is competent. The risks and                         benefits of the procedure and the sedation options and                         risks were discussed with the patient. All questions                         were answered and informed consent was obtained.                         Patient identification and proposed procedure were                         verified by the physician, the nurse, the                         anesthesiologist, the anesthetist and the technician                         in the endoscopy suite. Mental Status Examination:                         alert and oriented. Airway Examination: normal                         oropharyngeal airway and neck mobility. Respiratory                         Examination: clear to auscultation. CV Examination:                         normal. Prophylactic Antibiotics: The patient does not                         require prophylactic antibiotics. Prior  Anticoagulants: The patient has taken no previous                         anticoagulant or antiplatelet agents. ASA Grade                         Assessment: III - A  patient with severe systemic                         disease. After reviewing the risks and benefits, the                         patient was deemed in satisfactory condition to                         undergo the procedure. The anesthesia plan was to use                         monitored anesthesia care (MAC). Immediately prior to                         administration of medications, the patient was                         re-assessed for adequacy to receive sedatives. The                         heart rate, respiratory rate, oxygen saturations,                         blood pressure, adequacy of pulmonary ventilation, and                         response to care were monitored throughout the                         procedure. The physical status of the patient was                         re-assessed after the procedure.                        After obtaining informed consent, the colonoscope was                         passed under direct vision. Throughout the procedure,                         the patient's blood pressure, pulse, and oxygen                         saturations were monitored continuously. The                         Colonoscope was introduced through the anus and                         advanced to the the cecum, identified by appendiceal  orifice and ileocecal valve. The colonoscopy was                         performed without difficulty. The patient tolerated                         the procedure well. The quality of the bowel                         preparation was adequate to identify polyps. Findings:      The perianal and digital rectal examinations were normal.      Scattered small-mouthed diverticula were found in the sigmoid colon and       ascending colon.      Internal hemorrhoids were found during retroflexion. The hemorrhoids       were Grade I (internal hemorrhoids that do not prolapse).      The exam was otherwise without  abnormality on direct and retroflexion       views. Impression:            - Diverticulosis in the sigmoid colon and in the                         ascending colon.                        - Internal hemorrhoids.                        - The examination was otherwise normal on direct and                         retroflexion views.                        - No specimens collected. Recommendation:        - Discharge patient to home.                        - Resume previous diet.                        - Continue present medications.                        - Repeat colonoscopy in 10 years for screening                         purposes.                        - Return to referring physician as previously                         scheduled. Procedure Code(s):     --- Professional ---                        P5374, Colorectal cancer screening; colonoscopy on                         individual not meeting criteria for high risk Diagnosis Code(s):     --- Professional ---  Z12.11, Encounter for screening for malignant neoplasm                         of colon                        K64.0, First degree hemorrhoids                        K57.30, Diverticulosis of large intestine without                         perforation or abscess without bleeding CPT copyright 2019 [employer]. All rights reserved. The codes documented in this report are preliminary and upon coder review may  be revised to meet current compliance requirements. Andrey Farmer MD, MD 01/01/2022 10:03:59 AM Number of Addenda: 0 Note Initiated On: 01/01/2022 9:16 AM Scope Withdrawal Time: 0 hours 8 minutes 2 seconds  Total Procedure Duration: 0 hours 12 minutes 40 seconds  Estimated Blood Loss:  Estimated blood loss: none.      [facility]

## 2022-01-01 NOTE — Transfer of Care (Signed)
Immediate Anesthesia Transfer of Care Note  Patient: [name]  Procedure(s) Performed: ESOPHAGOGASTRODUODENOSCOPY (EGD) WITH PROPOFOL COLONOSCOPY WITH PROPOFOL  Patient Location: PACU  Anesthesia Type:General  Level of Consciousness: awake, alert  and oriented  Airway & Oxygen Therapy: Patient Spontanous Breathing and Patient connected to nasal cannula oxygen  Post-op Assessment: Report given to RN and Post -op Vital signs reviewed and stable  Post vital signs: Reviewed and stable  Last Vitals:  Vitals Value Taken Time  BP    Temp 35.9 C 01/01/22 0952  Pulse 62 01/01/22 0953  Resp 13 01/01/22 0953  SpO2 99 % 01/01/22 0953  Vitals shown include unvalidated device data.  Last Pain:  Vitals:   01/01/22 0952  TempSrc: Temporal  PainSc:          Complications: No notable events documented.

## 2022-01-01 NOTE — Op Note (Signed)
[facility] Gastroenterology Patient Name: [name] Procedure Date: 01/01/2022 9:17 AM MRN: [id number] Account #: [id number] Date of Birth: July 24, 1976 Admit Type: Outpatient Age: 46 Room: [facility] ENDO ROOM 3 Gender: Female Note Status: Finalized Instrument Name: Altamese Cabal Endoscope [id number] Procedure:             Upper GI endoscopy Indications:           Dyspepsia Providers:             Andrey Farmer MD, MD Medicines:             Monitored Anesthesia Care Complications:         No immediate complications. Estimated blood loss:                         Minimal. Procedure:             Pre-Anesthesia Assessment:                        - Prior to the procedure, a History and Physical was                         performed, and patient medications and allergies were                         reviewed. The patient is competent. The risks and                         benefits of the procedure and the sedation options and                         risks were discussed with the patient. All questions                         were answered and informed consent was obtained.                         Patient identification and proposed procedure were                         verified by the physician, the nurse, the                         anesthesiologist, the anesthetist and the technician                         in the endoscopy suite. Mental Status Examination:                         alert and oriented. Airway Examination: normal                         oropharyngeal airway and neck mobility. Respiratory                         Examination: clear to auscultation. CV Examination:                         normal. Prophylactic Antibiotics: The patient does not  require prophylactic antibiotics. Prior                         Anticoagulants: The patient has taken no previous                         anticoagulant or antiplatelet agents. ASA Grade                          Assessment: III - A patient with severe systemic                         disease. After reviewing the risks and benefits, the                         patient was deemed in satisfactory condition to                         undergo the procedure. The anesthesia plan was to use                         monitored anesthesia care (MAC). Immediately prior to                         administration of medications, the patient was                         re-assessed for adequacy to receive sedatives. The                         heart rate, respiratory rate, oxygen saturations,                         blood pressure, adequacy of pulmonary ventilation, and                         response to care were monitored throughout the                         procedure. The physical status of the patient was                         re-assessed after the procedure.                        After obtaining informed consent, the endoscope was                         passed under direct vision. Throughout the procedure,                         the patient's blood pressure, pulse, and oxygen                         saturations were monitored continuously. The Endoscope                         was introduced through the mouth, and advanced to the  jejunum. The upper GI endoscopy was accomplished                         without difficulty. The patient tolerated the                         procedure well. Findings:      Areas of ectopic gastric mucosa were found in the upper third of the       esophagus.      The exam of the esophagus was otherwise normal.      Evidence of a Roux-en-Y gastrojejunostomy was found. Small gastric       pouch. The gastrojejunal anastomosis was characterized by healthy       appearing mucosa. The jejunojejunal anastomosis was characterized by       healthy appearing mucosa. Biopsies were taken with a cold forceps for       histology. Estimated blood loss was  minimal. Impression:            - Ectopic gastric mucosa in the upper third of the                         esophagus.                        - Roux-en-Y gastrojejunostomy with gastrojejunal                         anastomosis characterized by healthy appearing mucosa.                         Biopsied. Recommendation:        - Discharge patient to home.                        - Resume previous diet.                        - Continue present medications.                        - Await pathology results.                        - Perform a colonoscopy today. Procedure Code(s):     --- Professional ---                        [phone], Esophagogastroduodenoscopy, flexible,                         transoral; with biopsy, single or multiple Diagnosis Code(s):     --- Professional ---                        Z98.0, Intestinal bypass and anastomosis status                        R10.13, Epigastric pain CPT copyright 2019 American Medical Association. All rights reserved. The codes documented in this report are preliminary and upon coder review may  be revised to meet current compliance requirements. Andrey Farmer MD, MD 01/01/2022 10:01:32 AM Number of Addenda: 0 Note Initiated On: 01/01/2022  9:17 AM Estimated Blood Loss:  Estimated blood loss was minimal.      [facility]

## 2022-01-01 NOTE — Interval H&P Note (Signed)
History and Physical Interval Note:  01/01/2022 9:21 AM  [name]  has presented today for surgery, with the diagnosis of Dyspepsia (R10.13) Colon Cancer Screening (Z12.11).  The various methods of treatment have been discussed with the patient and family. After consideration of risks, benefits and other options for treatment, the patient has consented to  Procedure(s): ESOPHAGOGASTRODUODENOSCOPY (EGD) WITH PROPOFOL (N/A) COLONOSCOPY WITH PROPOFOL (N/A) as a surgical intervention.  The patient's history has been reviewed, patient examined, no change in status, stable for surgery.  I have reviewed the patient's chart and labs.  Questions were answered to the patient's satisfaction.     Regis Bill  Ok to proceed with EGD/Colonoscopy

## 2022-01-02 LAB — SURGICAL PATHOLOGY

## 2022-01-02 NOTE — Anesthesia Postprocedure Evaluation (Signed)
Anesthesia Post Note  Patient: [name]  Procedure(s) Performed: ESOPHAGOGASTRODUODENOSCOPY (EGD) WITH PROPOFOL COLONOSCOPY WITH PROPOFOL  Patient location during evaluation: Endoscopy Anesthesia Type: General Level of consciousness: awake and alert Pain management: pain level controlled Vital Signs Assessment: post-procedure vital signs reviewed and stable Respiratory status: spontaneous breathing, nonlabored ventilation, respiratory function stable and patient connected to nasal cannula oxygen Cardiovascular status: blood pressure returned to baseline and stable Postop Assessment: no apparent nausea or vomiting Anesthetic complications: no   No notable events documented.   Last Vitals:  Vitals:   01/01/22 1012 01/01/22 1022  BP: 91/75 125/70  Pulse:    Resp:    Temp:    SpO2:      Last Pain:  Vitals:   01/02/22 0744  TempSrc:   PainSc: 0-No pain                 Lenard Simmer

## 2022-01-19 ENCOUNTER — Other Ambulatory Visit: Payer: Self-pay | Admitting: Gastroenterology

## 2022-01-19 DIAGNOSIS — R110 Nausea: Secondary | ICD-10-CM

## 2022-03-16 ENCOUNTER — Ambulatory Visit: Payer: Medicare Other

## 2022-03-20 ENCOUNTER — Ambulatory Visit: Payer: Medicare Other

## 2022-03-21 ENCOUNTER — Other Ambulatory Visit: Payer: Self-pay | Admitting: Gastroenterology

## 2022-03-21 DIAGNOSIS — R112 Nausea with vomiting, unspecified: Secondary | ICD-10-CM

## 2022-03-23 ENCOUNTER — Ambulatory Visit: Admission: RE | Admit: 2022-03-23 | Payer: Medicare Other | Source: Ambulatory Visit

## 2023-07-15 ENCOUNTER — Other Ambulatory Visit: Payer: Self-pay | Admitting: Physician Assistant

## 2023-07-15 DIAGNOSIS — Z1231 Encounter for screening mammogram for malignant neoplasm of breast: Secondary | ICD-10-CM

## 2023-08-07 ENCOUNTER — Ambulatory Visit: Payer: Medicare Other

## 2023-09-03 ENCOUNTER — Ambulatory Visit
Admission: RE | Admit: 2023-09-03 | Discharge: 2023-09-03 | Disposition: A | Discharge status: Home / Self Care | Payer: Medicare Other | Source: Ambulatory Visit | Attending: Physician Assistant | Admitting: Physician Assistant

## 2023-09-03 DIAGNOSIS — Z1231 Encounter for screening mammogram for malignant neoplasm of breast: Secondary | ICD-10-CM | POA: Diagnosis present

## 2024-10-29 ENCOUNTER — Other Ambulatory Visit: Payer: Self-pay | Admitting: Physician Assistant

## 2024-10-29 DIAGNOSIS — Z1231 Encounter for screening mammogram for malignant neoplasm of breast: Secondary | ICD-10-CM

## 2024-12-08 ENCOUNTER — Other Ambulatory Visit: Payer: Self-pay | Admitting: Medical Genetics

## 2024-12-08 ENCOUNTER — Encounter (INDEPENDENT_AMBULATORY_CARE_PROVIDER_SITE_OTHER): Payer: Self-pay

## 2024-12-09 ENCOUNTER — Other Ambulatory Visit
Admission: RE | Admit: 2024-12-09 | Discharge: 2024-12-09 | Disposition: A | Discharge status: Home / Self Care | Payer: Self-pay | Source: Ambulatory Visit | Attending: Medical Genetics | Admitting: Medical Genetics

## 2024-12-14 ENCOUNTER — Ambulatory Visit
Admission: RE | Admit: 2024-12-14 | Discharge: 2024-12-14 | Disposition: A | Discharge status: Home / Self Care | Source: Ambulatory Visit | Attending: Physician Assistant | Admitting: Physician Assistant

## 2024-12-14 ENCOUNTER — Ambulatory Visit

## 2024-12-14 DIAGNOSIS — Z1231 Encounter for screening mammogram for malignant neoplasm of breast: Secondary | ICD-10-CM | POA: Diagnosis present

## 2024-12-25 LAB — GENECONNECT MOLECULAR SCREEN: Genetic Analysis Overall Interpretation: NEGATIVE
